# Patient Record
Sex: Female | Born: 1970 | Race: Black or African American | Hispanic: No | Marital: Married | Smoking: Current every day smoker
Health system: Southern US, Community
[De-identification: ages and names within clinical notes are randomized; demographics above are authoritative.]

## PROBLEM LIST (undated history)

## (undated) DIAGNOSIS — D6859 Other primary thrombophilia: Secondary | ICD-10-CM

## (undated) DIAGNOSIS — M549 Dorsalgia, unspecified: Secondary | ICD-10-CM

## (undated) DIAGNOSIS — R531 Weakness: Secondary | ICD-10-CM

## (undated) DIAGNOSIS — J189 Pneumonia, unspecified organism: Secondary | ICD-10-CM

## (undated) DIAGNOSIS — Z86711 Personal history of pulmonary embolism: Secondary | ICD-10-CM

## (undated) DIAGNOSIS — R3915 Urgency of urination: Secondary | ICD-10-CM

## (undated) DIAGNOSIS — G8929 Other chronic pain: Secondary | ICD-10-CM

## (undated) DIAGNOSIS — M1990 Unspecified osteoarthritis, unspecified site: Secondary | ICD-10-CM

## (undated) HISTORY — PX: WISDOM TOOTH EXTRACTION: SHX21

## (undated) HISTORY — PX: ABDOMINAL HYSTERECTOMY: SHX81

## (undated) HISTORY — PX: BACK SURGERY: SHX140

## (undated) HISTORY — PX: KNEE ARTHROSCOPY: SUR90

---

## 2002-12-16 HISTORY — PX: DIAGNOSTIC LAPAROSCOPY: SUR761

## 2008-03-16 DIAGNOSIS — Z86711 Personal history of pulmonary embolism: Secondary | ICD-10-CM

## 2008-03-16 HISTORY — DX: Personal history of pulmonary embolism: Z86.711

## 2012-10-19 ENCOUNTER — Encounter (HOSPITAL_COMMUNITY): Payer: Self-pay | Admitting: Emergency Medicine

## 2012-10-19 ENCOUNTER — Emergency Department (HOSPITAL_COMMUNITY)
Admission: EM | Admit: 2012-10-19 | Discharge: 2012-10-20 | Disposition: A | Discharge status: Home / Self Care | Payer: BC Managed Care – PPO | Attending: Emergency Medicine | Admitting: Emergency Medicine

## 2012-10-19 ENCOUNTER — Emergency Department (HOSPITAL_COMMUNITY): Payer: BC Managed Care – PPO

## 2012-10-19 DIAGNOSIS — Z862 Personal history of diseases of the blood and blood-forming organs and certain disorders involving the immune mechanism: Secondary | ICD-10-CM | POA: Insufficient documentation

## 2012-10-19 DIAGNOSIS — N938 Other specified abnormal uterine and vaginal bleeding: Secondary | ICD-10-CM | POA: Insufficient documentation

## 2012-10-19 DIAGNOSIS — R0789 Other chest pain: Secondary | ICD-10-CM

## 2012-10-19 DIAGNOSIS — R0609 Other forms of dyspnea: Secondary | ICD-10-CM | POA: Insufficient documentation

## 2012-10-19 DIAGNOSIS — Z8742 Personal history of other diseases of the female genital tract: Secondary | ICD-10-CM | POA: Insufficient documentation

## 2012-10-19 DIAGNOSIS — R0600 Dyspnea, unspecified: Secondary | ICD-10-CM

## 2012-10-19 DIAGNOSIS — R0989 Other specified symptoms and signs involving the circulatory and respiratory systems: Secondary | ICD-10-CM | POA: Insufficient documentation

## 2012-10-19 DIAGNOSIS — Z86718 Personal history of other venous thrombosis and embolism: Secondary | ICD-10-CM | POA: Insufficient documentation

## 2012-10-19 DIAGNOSIS — F17200 Nicotine dependence, unspecified, uncomplicated: Secondary | ICD-10-CM | POA: Insufficient documentation

## 2012-10-19 DIAGNOSIS — N949 Unspecified condition associated with female genital organs and menstrual cycle: Secondary | ICD-10-CM | POA: Insufficient documentation

## 2012-10-19 HISTORY — DX: Other primary thrombophilia: D68.59

## 2012-10-19 LAB — COMPREHENSIVE METABOLIC PANEL
ALT: 11 U/L (ref 0–35)
Alkaline Phosphatase: 78 U/L (ref 39–117)
CO2: 25 mEq/L (ref 19–32)
Chloride: 104 mEq/L (ref 96–112)
GFR calc Af Amer: >90 mL/min (ref >90)
GFR calc non Af Amer: >90 mL/min (ref >90)
Glucose, Bld: 91 mg/dL (ref 70–99)
Potassium: 4.7 mEq/L (ref 3.5–5.1)
Sodium: 138 mEq/L (ref 135–145)
Total Bilirubin: 0.3 mg/dL (ref 0.3–1.2)

## 2012-10-19 LAB — CBC WITH DIFFERENTIAL/PLATELET
Eosinophils Relative: 1 % (ref 0–5)
Lymphocytes Relative: 44 % (ref 12–46)
Lymphs Abs: 3.3 10*3/uL (ref 0.7–4.0)
MCV: 89 fL (ref 78.0–100.0)
Neutrophils Relative %: 43 % (ref 43–77)
Platelets: 330 10*3/uL (ref 150–400)
RBC: 4.64 MIL/uL (ref 3.87–5.11)
WBC: 7.4 10*3/uL (ref 4.0–10.5)

## 2012-10-19 MED ORDER — SODIUM CHLORIDE 0.9 % IV BOLUS (SEPSIS)
1000.0000 mL | Freq: Once | INTRAVENOUS | Status: AC
  Administered 2012-10-20: 1000 mL via INTRAVENOUS

## 2012-10-19 MED ORDER — MORPHINE SULFATE 4 MG/ML IJ SOLN
4.0000 mg | Freq: Once | INTRAMUSCULAR | Status: AC
  Administered 2012-10-20: 4 mg via INTRAVENOUS
  Filled 2012-10-19: qty 1

## 2012-10-19 MED ORDER — ONDANSETRON HCL 4 MG/2ML IJ SOLN
4.0000 mg | Freq: Once | INTRAMUSCULAR | Status: AC
  Administered 2012-10-20: 4 mg via INTRAVENOUS
  Filled 2012-10-19: qty 2

## 2012-10-19 NOTE — ED Notes (Signed)
PT. REPORTS INTERMITTENT VAGINAL BLEEDING WITH CLOTS FOR 3 WEEKS , ALSO REPORTS SOB AND LIGHTHEADED STATES HISTORY OF PROTEIN C DEFICIENCY / PULMONARY EMBOLISM  AND ENDOMETRIOSIS.

## 2012-10-20 ENCOUNTER — Emergency Department (HOSPITAL_COMMUNITY): Payer: BC Managed Care – PPO

## 2012-10-20 LAB — URINE MICROSCOPIC-ADD ON

## 2012-10-20 LAB — URINALYSIS, ROUTINE W REFLEX MICROSCOPIC
Specific Gravity, Urine: 1.005 (ref 1.005–1.030)
Urobilinogen, UA: 1 mg/dL (ref 0.0–1.0)

## 2012-10-20 MED ORDER — IOHEXOL 350 MG/ML SOLN
100.0000 mL | Freq: Once | INTRAVENOUS | Status: AC | PRN
  Administered 2012-10-20: 100 mL via INTRAVENOUS

## 2012-10-20 NOTE — ED Notes (Signed)
Encouraged to attempt urine sample.

## 2012-10-20 NOTE — ED Notes (Signed)
Patient returned from CT

## 2012-10-20 NOTE — ED Notes (Signed)
Pt alert, NAD, calm, interactive, resting on R side/sleeping, BP trending lower,  EDP aware, EDP into room, pt updated. No changes.

## 2012-10-20 NOTE — ED Notes (Signed)
Patient transported to CT 

## 2012-10-20 NOTE — ED Notes (Signed)
Low BP noted. Will continue to monitor. Asymptomatic. Family at [facility].

## 2012-10-20 NOTE — ED Notes (Signed)
EDP in to room. Pt sleeping/resting, arousable, NAD, calm, interactive. Family at [facility].

## 2012-10-20 NOTE — ED Provider Notes (Signed)
History     CSN: [id number]  Arrival date & time 10/19/12  2025   First MD Initiated Contact with Patient 10/19/12 2318      Chief Complaint  Patient presents with  . Vaginal Bleeding  . Shortness of Breath    (Consider location/radiation/quality/duration/timing/severity/associated sxs/prior treatment) HPI Pt with R sided chest pain starting yesterday with SOB. Pain worse with inspriation. States she thinks this is similar to prev PE. No fever chills, cough. No recent lower ext swelling. Pt is not currently on blood thinners.   Pt also states she has had intermittent vaginal bleeding. She has known fibroids. No new d/c or urinary symptoms Past Medical History  Diagnosis Date  . Pulmonary embolism   . Protein C deficiency   . Endometriosis   . Fibroids     History reviewed. No pertinent past surgical history.  No family history on file.  History  Substance Use Topics  . Smoking status: Current Every Day Smoker  . Smokeless tobacco: Not on file  . Alcohol Use: No    OB History    Grav Para Term Preterm Abortions TAB SAB Ect Mult Living                  Review of Systems  Constitutional: Negative for fever and chills.  HENT: Negative for neck pain.   Respiratory: Positive for shortness of breath. Negative for cough and wheezing.   Cardiovascular: Positive for chest pain. Negative for palpitations and leg swelling.  Gastrointestinal: Negative for nausea, vomiting, abdominal pain, diarrhea and constipation.  Genitourinary: Positive for vaginal bleeding. Negative for dysuria, hematuria, flank pain, vaginal discharge and vaginal pain.  Musculoskeletal: Negative for back pain.  Skin: Negative for rash and wound.  Neurological: Negative for dizziness, weakness, numbness and headaches.    Allergies  Review of patient's allergies indicates no known allergies.  Home Medications  No current outpatient prescriptions on file.  BP 106/69  Pulse 77  Temp 97.5 F (36.4  C) (Oral)  Resp 18  SpO2 95%  Physical Exam  Nursing note and vitals reviewed. Constitutional: She is oriented to person, place, and time. She appears well-developed and well-nourished. No distress.  HENT:  Head: Normocephalic and atraumatic.  Mouth/Throat: Oropharynx is clear and moist.  Eyes: EOM are normal. Pupils are equal, round, and reactive to light.  Neck: Normal range of motion. Neck supple.  Cardiovascular: Normal rate and regular rhythm.   Pulmonary/Chest: Effort normal and breath sounds normal. No respiratory distress. She has no wheezes. She has no rales. She exhibits tenderness (R chest wall TTP. No crepitance or deformity ).  Abdominal: Soft. Bowel sounds are normal. She exhibits no mass. There is no tenderness. There is no rebound and no guarding.  Musculoskeletal: Normal range of motion. She exhibits no edema and no tenderness.       No calf swelling or tenderness  Neurological: She is alert and oriented to person, place, and time.       5/5 motor in all ext. Sensation fully intact  Skin: Skin is warm and dry. No rash noted. No erythema.  Psychiatric: She has a normal mood and affect. Her behavior is normal.    ED Course  Procedures (including critical care time)  Labs Reviewed  URINALYSIS, ROUTINE W REFLEX MICROSCOPIC - Abnormal; Notable for the following:    Hgb urine dipstick MODERATE (*)     All other components within normal limits  URINE MICROSCOPIC-ADD ON - Abnormal; Notable for the following:  Squamous Epithelial / LPF FEW (*)     All other components within normal limits  CBC WITH DIFFERENTIAL  COMPREHENSIVE METABOLIC PANEL  PROTIME-INR  TROPONIN I  POCT PREGNANCY, URINE  TROPONIN I   Dg Chest 2 View  10/19/2012  *RADIOLOGY REPORT*  Clinical Data: 41 year old female with shortness of breath chest pain vaginal bleeding.  CHEST - 2 VIEW  Comparison: None.  Findings: Lung volumes are within normal limits. Normal cardiac size and mediastinal contours.   Visualized tracheal air column is within normal limits.    Small calcified granuloma in the right middle lobe.  No pneumothorax, pulmonary edema, pleural effusion or confluent pulmonary opacity. No acute osseous abnormality identified.  IMPRESSION: No acute cardiopulmonary abnormality.   Original Report Authenticated By: Erskine Speed, M.D.    Ct Angio Chest W/cm &/or Wo Cm  10/20/2012  *RADIOLOGY REPORT*  Clinical Data: Shortness of breath; vaginal bleeding.  CT ANGIOGRAPHY CHEST  Technique:  Multidetector CT imaging of the chest using the standard protocol during bolus administration of intravenous contrast. Multiplanar reconstructed images including MIPs were obtained and reviewed to evaluate the vascular anatomy.  Contrast: OMNIPAQUE IOHEXOL 350 MG/ML SOLN  Comparison: Chest radiograph performed 10/19/2012  Findings: There is no evidence of pulmonary embolus.  Scattered small thin-walled cysts are noted within the upper lung lobes and the right middle lobe.  This appearance can be seen with lymphangioleiomyomatosis or Langerhan's cell histiocytosis, though emphysema is considered more likely.  A single 3 mm nodule is noted at the right upper lobe (image 28 of 91).  The lungs otherwise appear grossly clear.  A calcified granuloma is noted at the right lung base.  There is no evidence of significant focal consolidation, pleural effusion or pneumothorax.  No masses are identified; no abnormal focal contrast enhancement is seen.  The mediastinum is unremarkable in appearance.  No mediastinal lymphadenopathy is seen.  No pericardial effusion is identified. The great vessels are unremarkable in appearance.  The visualized portions of the thyroid gland are unremarkable.  No axillary lymphadenopathy is seen.  The visualized portions of the liver and spleen are unremarkable. The visualized portions of the pancreas, gallbladder, stomach, adrenal glands and kidneys are within normal limits.  No acute osseous  abnormalities are seen.  IMPRESSION:  1.  No evidence of pulmonary embolus. 2.  Scattered small cysts within the upper lung lobes and right middle lobe.  Emphysema is considered most likely, though this appearance can be seen with lymphangioleiomyomatosis or Langerhan's cell histiocytosis.  Single 3 mm nodule noted at the right upper lobe.   Original Report Authenticated By: Tonia Ghent, M.D.      1. Dyspnea   2. Atypical chest pain   3. Dysfunctional uterine bleeding      Date: 10/20/2012  Rate: 82  Rhythm: normal sinus rhythm  QRS Axis: right  Intervals: normal  ST/T Wave abnormalities: normal  Conduction Disutrbances:incomplete RBBB  Narrative Interpretation:   Old EKG Reviewed: none available    MDM   Pt symptoms have resolved. Trop x 2 neg. CT chest with no PE. Stabel VS and hgb. Pt advised to f/u with gynecology for irregular bleeding. Return for concerns       Loren Racer, MD 10/20/12 [phone]

## 2012-10-20 NOTE — ED Notes (Signed)
consistant HR of 90 while walking down hallway. SPO2 100% RA. Steady gait. Reports some light headedness. Returning BP lying down after walk 90/58.

## 2012-11-04 ENCOUNTER — Telehealth: Payer: Self-pay | Admitting: Hematology and Oncology

## 2012-11-04 NOTE — Telephone Encounter (Signed)
S/W PT IN REF TO NP APPT. ON 11/25/12@1 :00 REFERRING DR. Seymour Bars DX-HX PROTEIN C DEFICIENCY MAILED NP PACKET

## 2012-11-24 ENCOUNTER — Telehealth: Payer: Self-pay | Admitting: Oncology

## 2012-11-24 NOTE — Telephone Encounter (Signed)
R/S NP APPT TO 12/23/12 @ 10:30 WITH DR HA. PT IS AWARE.

## 2012-11-25 ENCOUNTER — Other Ambulatory Visit: Payer: BC Managed Care – PPO | Admitting: Lab

## 2012-11-25 ENCOUNTER — Ambulatory Visit: Payer: BC Managed Care – PPO | Admitting: Hematology and Oncology

## 2012-11-25 ENCOUNTER — Ambulatory Visit: Payer: BC Managed Care – PPO

## 2012-12-21 ENCOUNTER — Encounter: Payer: Self-pay | Admitting: Oncology

## 2012-12-21 DIAGNOSIS — D219 Benign neoplasm of connective and other soft tissue, unspecified: Secondary | ICD-10-CM | POA: Insufficient documentation

## 2012-12-21 DIAGNOSIS — N809 Endometriosis, unspecified: Secondary | ICD-10-CM | POA: Insufficient documentation

## 2012-12-21 DIAGNOSIS — I2699 Other pulmonary embolism without acute cor pulmonale: Secondary | ICD-10-CM | POA: Insufficient documentation

## 2012-12-21 DIAGNOSIS — D6859 Other primary thrombophilia: Secondary | ICD-10-CM | POA: Insufficient documentation

## 2012-12-23 ENCOUNTER — Other Ambulatory Visit: Payer: BC Managed Care – PPO | Admitting: Lab

## 2012-12-23 ENCOUNTER — Ambulatory Visit: Payer: BC Managed Care – PPO | Admitting: Oncology

## 2012-12-23 ENCOUNTER — Ambulatory Visit: Payer: BC Managed Care – PPO

## 2012-12-23 NOTE — Progress Notes (Signed)
No show.  Reschedule prn.  

## 2013-01-12 ENCOUNTER — Other Ambulatory Visit: Payer: Self-pay | Admitting: Oncology

## 2013-01-26 ENCOUNTER — Encounter (HOSPITAL_COMMUNITY): Payer: Self-pay

## 2013-01-27 ENCOUNTER — Other Ambulatory Visit: Payer: Self-pay | Admitting: Obstetrics & Gynecology

## 2013-01-27 ENCOUNTER — Encounter (HOSPITAL_COMMUNITY): Payer: Self-pay | Admitting: *Deleted

## 2013-01-28 ENCOUNTER — Encounter (HOSPITAL_COMMUNITY): Payer: Self-pay | Admitting: Anesthesiology

## 2013-01-28 ENCOUNTER — Ambulatory Visit (HOSPITAL_COMMUNITY)
Admission: RE | Admit: 2013-01-28 | Discharge: 2013-01-28 | Disposition: A | Discharge status: Home / Self Care | Payer: BC Managed Care – PPO | Source: Ambulatory Visit | Attending: Obstetrics & Gynecology | Admitting: Obstetrics & Gynecology

## 2013-01-28 ENCOUNTER — Encounter (HOSPITAL_COMMUNITY)
Admission: RE | Disposition: A | Discharge status: Home / Self Care | Payer: Self-pay | Source: Ambulatory Visit | Attending: Obstetrics & Gynecology

## 2013-01-28 ENCOUNTER — Ambulatory Visit (HOSPITAL_COMMUNITY): Payer: BC Managed Care – PPO | Admitting: Anesthesiology

## 2013-01-28 DIAGNOSIS — O039 Complete or unspecified spontaneous abortion without complication: Secondary | ICD-10-CM | POA: Insufficient documentation

## 2013-01-28 DIAGNOSIS — D6859 Other primary thrombophilia: Secondary | ICD-10-CM | POA: Insufficient documentation

## 2013-01-28 HISTORY — PX: DILATION AND EVACUATION: SHX1459

## 2013-01-28 LAB — CBC
HCT: 36.7 % (ref 36.0–46.0)
MCV: 89.3 fL (ref 78.0–100.0)
Platelets: 323 10*3/uL (ref 150–400)
RBC: 4.11 MIL/uL (ref 3.87–5.11)
RDW: 12.4 % (ref 11.5–15.5)
WBC: 5.9 10*3/uL (ref 4.0–10.5)

## 2013-01-28 LAB — ABO/RH: ABO/RH(D): O POS

## 2013-01-28 SURGERY — DILATION AND EVACUATION, UTERUS
Anesthesia: Monitor Anesthesia Care | Site: Uterus | Wound class: Clean Contaminated

## 2013-01-28 MED ORDER — DEXAMETHASONE SODIUM PHOSPHATE 10 MG/ML IJ SOLN
INTRAMUSCULAR | Status: AC
  Filled 2013-01-28: qty 1

## 2013-01-28 MED ORDER — LIDOCAINE HCL (CARDIAC) 20 MG/ML IV SOLN
INTRAVENOUS | Status: AC
  Filled 2013-01-28: qty 5

## 2013-01-28 MED ORDER — FENTANYL CITRATE 0.05 MG/ML IJ SOLN
25.0000 ug | INTRAMUSCULAR | Status: DC | PRN
  Administered 2013-01-28 (×2): 50 ug via INTRAVENOUS

## 2013-01-28 MED ORDER — KETOROLAC TROMETHAMINE 30 MG/ML IJ SOLN: 15.0000 mg | Freq: Once | INTRAMUSCULAR | Status: DC | PRN

## 2013-01-28 MED ORDER — CEFAZOLIN SODIUM-DEXTROSE 2-3 GM-% IV SOLR
2.0000 g | INTRAVENOUS | Status: AC
  Administered 2013-01-28: 2 g via INTRAVENOUS

## 2013-01-28 MED ORDER — SILVER NITRATE-POT NITRATE 75-25 % EX MISC
CUTANEOUS | Status: AC
  Filled 2013-01-28: qty 2

## 2013-01-28 MED ORDER — KETOROLAC TROMETHAMINE 30 MG/ML IJ SOLN
INTRAMUSCULAR | Status: DC | PRN
  Administered 2013-01-28: 30 mg via INTRAVENOUS

## 2013-01-28 MED ORDER — MIDAZOLAM HCL 2 MG/2ML IJ SOLN
INTRAMUSCULAR | Status: AC
  Filled 2013-01-28: qty 2

## 2013-01-28 MED ORDER — SILVER NITRATE-POT NITRATE 75-25 % EX MISC
CUTANEOUS | Status: DC | PRN
  Administered 2013-01-28: 2

## 2013-01-28 MED ORDER — LACTATED RINGERS IV SOLN
INTRAVENOUS | Status: DC
  Administered 2013-01-28 (×2): via INTRAVENOUS

## 2013-01-28 MED ORDER — CEFAZOLIN SODIUM-DEXTROSE 2-3 GM-% IV SOLR
INTRAVENOUS | Status: AC
  Filled 2013-01-28: qty 50

## 2013-01-28 MED ORDER — ONDANSETRON HCL 4 MG/2ML IJ SOLN
INTRAMUSCULAR | Status: DC | PRN
  Administered 2013-01-28: 4 mg via INTRAVENOUS

## 2013-01-28 MED ORDER — FENTANYL CITRATE 0.05 MG/ML IJ SOLN
INTRAMUSCULAR | Status: AC
  Filled 2013-01-28: qty 2

## 2013-01-28 MED ORDER — DEXAMETHASONE SODIUM PHOSPHATE 10 MG/ML IJ SOLN
INTRAMUSCULAR | Status: DC | PRN
  Administered 2013-01-28: 10 mg via INTRAVENOUS

## 2013-01-28 MED ORDER — PROPOFOL 10 MG/ML IV EMUL
INTRAVENOUS | Status: AC
  Filled 2013-01-28: qty 40

## 2013-01-28 MED ORDER — MIDAZOLAM HCL 5 MG/5ML IJ SOLN
INTRAMUSCULAR | Status: DC | PRN
  Administered 2013-01-28: 2 mg via INTRAVENOUS

## 2013-01-28 MED ORDER — PROPOFOL 10 MG/ML IV EMUL
INTRAVENOUS | Status: DC | PRN
  Administered 2013-01-28 (×5): 50 mg via INTRAVENOUS

## 2013-01-28 MED ORDER — FENTANYL CITRATE 0.05 MG/ML IJ SOLN
INTRAMUSCULAR | Status: DC | PRN
  Administered 2013-01-28 (×2): 100 ug via INTRAVENOUS

## 2013-01-28 MED ORDER — ONDANSETRON HCL 4 MG/2ML IJ SOLN
INTRAMUSCULAR | Status: AC
  Filled 2013-01-28: qty 2

## 2013-01-28 MED ORDER — LIDOCAINE HCL 1 % IJ SOLN
INTRAMUSCULAR | Status: DC | PRN
  Administered 2013-01-28: 20 mL

## 2013-01-28 MED ORDER — LIDOCAINE HCL (CARDIAC) 20 MG/ML IV SOLN
INTRAVENOUS | Status: DC | PRN
  Administered 2013-01-28: 50 mg via INTRAVENOUS

## 2013-01-28 MED ORDER — FENTANYL CITRATE 0.05 MG/ML IJ SOLN
INTRAMUSCULAR | Status: AC
  Administered 2013-01-28: 50 ug via INTRAVENOUS
  Filled 2013-01-28: qty 2

## 2013-01-28 SURGICAL SUPPLY — 23 items
CATH ROBINSON RED A/P 16FR (CATHETERS) ×2 IMPLANT
CLOTH BEACON ORANGE TIMEOUT ST (SAFETY) ×2 IMPLANT
DECANTER SPIKE VIAL GLASS SM (MISCELLANEOUS) ×2 IMPLANT
GLOVE BIO SURGEON STRL SZ 6.5 (GLOVE) ×4 IMPLANT
GLOVE BIOGEL PI IND STRL 7.0 (GLOVE) ×1 IMPLANT
GLOVE BIOGEL PI INDICATOR 7.0 (GLOVE) ×1
GLOVE ECLIPSE 6.0 STRL STRAW (GLOVE) ×1 IMPLANT
GLOVE INDICATOR 7.0 STRL GRN (GLOVE) ×1 IMPLANT
GOWN STRL REIN XL XLG (GOWN DISPOSABLE) ×4 IMPLANT
KIT BERKELEY 1ST TRIMESTER 3/8 (MISCELLANEOUS) ×2 IMPLANT
NDL SPNL 22GX3.5 QUINCKE BK (NEEDLE) ×1 IMPLANT
NEEDLE SPNL 22GX3.5 QUINCKE BK (NEEDLE) ×2 IMPLANT
PACK VAGINAL MINOR WOMEN LF (CUSTOM PROCEDURE TRAY) ×2 IMPLANT
PAD OB MATERNITY 4.3X12.25 (PERSONAL CARE ITEMS) ×2 IMPLANT
PAD PREP 24X48 CUFFED NSTRL (MISCELLANEOUS) ×2 IMPLANT
SET BERKELEY SUCTION TUBING (SUCTIONS) ×2 IMPLANT
SYR CONTROL 10ML LL (SYRINGE) ×2 IMPLANT
TOWEL OR 17X24 6PK STRL BLUE (TOWEL DISPOSABLE) ×4 IMPLANT
VACURETTE 10 RIGID CVD (CANNULA) ×1 IMPLANT
VACURETTE 7MM CVD STRL WRAP (CANNULA) IMPLANT
VACURETTE 8 RIGID CVD (CANNULA) IMPLANT
VACURETTE 9 RIGID CVD (CANNULA) IMPLANT
WATER STERILE IRR 1000ML POUR (IV SOLUTION) ×2 IMPLANT

## 2013-01-28 NOTE — Op Note (Signed)
01/28/2013  1:57 PM  PATIENT:  [name] [name]  42 y.o. female  PRE-OPERATIVE DIAGNOSIS:  Elective 8 wks, High Risk-Protein C Deficiency, H/O pulmonary embolism, AMA  POST-OPERATIVE DIAGNOSIS:  Elective, High Risk-Protein C Deficiency, H/O pulmonary embolism, AM andA   PROCEDURE:  Procedure(s): DILATATION AND EVACUATION  SURGEON:  Surgeon(s): Genia Del, MD  ASSISTANTS: none   ANESTHESIA:   MAC/paracervical block  Procedure:  Under MAC analgesia the patient is in lithotomy position. She is prepped with Betadine on the suprapubic, vulvar and vaginal areas. She is draped as usual. The vaginal exam reveals an anteverted uterus about 8 cm mobile no adnexal mass. A speculum is inserted in the vagina and the anterior lip of the cervix is grasped with a tenaculum. Dilation of the cervix with Hegar dilators up to #31 without difficulty. A #10 curved curet is used to suction the products of conception. We then used a sharp curet to complete the curettage on all uterine surfaces. We go back with the suction curette to assure that the cavity it completely emptied. We then removed the instruments.  We completed hemostasis on the anterior lip of the cervix with silver nitrate.   We removed the speculum.  Hemostasis was adequate.  The patient was brought to recovery room in good and stable status.  ESTIMATED BLOOD LOSS: 25 cc   Intake/Output Summary (Last 24 hours) at 01/28/13 1357 Last data filed at 01/28/13 1345  Gross per 24 hour  Intake      0 ml  Output    125 ml  Net   -125 ml     BLOOD ADMINISTERED:none   LOCAL MEDICATIONS USED:  LIDOCAINE   SPECIMEN:  Source of Specimen:  Products of conception  DISPOSITION OF SPECIMEN:  PATHOLOGY  COUNTS:  YES  PLAN OF CARE: Transfer to PACU   Genia Del MD  01/28/2013 at and a1:59 pm.

## 2013-01-28 NOTE — Anesthesia Postprocedure Evaluation (Signed)
  Anesthesia Post-op Note  Anesthesia Post Note  Patient: [name] [name]  Procedure(s) Performed: Procedure(s) (LRB): DILATATION AND EVACUATION (N/A)  Anesthesia type: MAC  Patient location: PACU  Post pain: Pain level controlled  Post assessment: Post-op Vital signs reviewed  Last Vitals:  Filed Vitals:   01/28/13 1530  BP: 110/78  Pulse: 68  Temp:   Resp: 16    Post vital signs: Reviewed  Level of consciousness: sedated  Complications: No apparent anesthesia complications

## 2013-01-28 NOTE — Transfer of Care (Signed)
Immediate Anesthesia Transfer of Care Note  Patient: [name] [name]  Procedure(s) Performed: Procedure(s): DILATATION AND EVACUATION (N/A)  Patient Location: PACU  Anesthesia Type:MAC  Level of Consciousness: awake, alert , oriented and patient cooperative  Airway & Oxygen Therapy: Patient Spontanous Breathing and Patient connected to nasal cannula oxygen  Post-op Assessment: Report given to PACU RN, Post -op Vital signs reviewed and stable and Patient moving all extremities X 4  Post vital signs: Reviewed and stable  Complications: No apparent anesthesia complications

## 2013-01-28 NOTE — Discharge Summary (Signed)
  Physician Discharge Summary  Patient ID: [name] [name] MRN: [id number] DOB/AGE: November 27, 1971 42 y.o.  Admit date: 01/28/2013 Discharge date: 01/28/2013  Admission Diagnoses: Elective, High Risk-Protein C Deficiency  46962  Discharge Diagnoses: Elective, High Risk-Protein C Deficiency  95284        Active Problems:   * No active hospital problems. *   Discharged Condition: good  Hospital Course:  Outpatient  Consults: None  Treatments: surgery: D+E  Disposition: 01-Home or Self Care   Future Appointments Provider Department Dept Phone   02/03/2013 2:15 PM [facility] [phone]       Medication List    TAKE these medications       HYDROcodone-acetaminophen 5-325 MG per tablet  Commonly known as:  NORCO/VICODIN  Take 0.5 tablets by mouth daily as needed for pain (has only taken 1.5 tablets this month).           Follow-up Information   Follow up with Devyn Griffing,MARIE-LYNE, MD In 1 week.   Contact information:   [address] [place] [address] [phone]       Signed: Genia Del, MD 01/28/2013, 2:10 PM

## 2013-01-28 NOTE — Anesthesia Preprocedure Evaluation (Addendum)
Anesthesia Evaluation  Patient identified by MRN, date of birth, ID band Patient awake    Reviewed: Allergy & Precautions, H&P , NPO status , Patient's Chart, lab work & pertinent test results, reviewed documented beta blocker date and time   History of Anesthesia Complications Negative for: history of anesthetic complications  Airway Mallampati: III TM Distance: >3 FB Neck ROM: full    Dental  (+) Teeth Intact   Pulmonary Current Smoker (1/2 ppd), PE (bilateral 2009, last lovenox 3/13) breath sounds clear to auscultation  Pulmonary exam normal       Cardiovascular Exercise Tolerance: Good negative cardio ROS      Neuro/Psych negative neurological ROS  negative psych ROS   GI/Hepatic negative GI ROS, Neg liver ROS,   Endo/Other  negative endocrine ROS  Renal/GU negative Renal ROS  Female GU complaint (fibroids, endometriosis)     Musculoskeletal   Abdominal Normal abdominal exam  (+)   Peds  Hematology Protein C deficiency, hypercoagulable   Anesthesia Other Findings   Reproductive/Obstetrics (+) Pregnancy (elective TAB)                          Anesthesia Physical Anesthesia Plan  ASA: II  Anesthesia Plan: MAC   Post-op Pain Management:    Induction:   Airway Management Planned:   Additional Equipment:   Intra-op Plan:   Post-operative Plan:   Informed Consent: I have reviewed the patients History and Physical, chart, labs and discussed the procedure including the risks, benefits and alternatives for the proposed anesthesia with the patient or authorized representative who has indicated his/her understanding and acceptance.     Plan Discussed with: Surgeon and CRNA  Anesthesia Plan Comments:         Anesthesia Quick Evaluation

## 2013-01-28 NOTE — H&P (Signed)
[name] [name] is an 42 y.o. female   Pertinent Gynecological History: Menses: flow is excessive with use of many pads or tampons on heaviest days Contraception: none Blood transfusions: none Sexually transmitted diseases: no past history Previous GYN Procedures: Dx LPS x 3  Last mammogram: normal  Last pap: normal    Menstrual History:  No LMP recorded. Patient is not currently having periods (Reason: Other).    Past Medical History  Diagnosis Date  . Bilateral pulmonary embolism 2009    3/13-last used lovenox  . Protein C deficiency   . Endometriosis   . Fibroids   . At high risk for complications of intrauterine pregnancy (IUP)     8 week    Past Surgical History  Procedure Laterality Date  . Diagnostic laparoscopy  2004    x3  . Knee arthroscopy      History reviewed. No pertinent family history.  Social History:  reports that she has been smoking Cigarettes.  She has a 1 pack-year smoking history. She does not have any smokeless tobacco history on file. She reports that she does not drink alcohol or use illicit drugs.  Allergies: No Known Allergies  Prescriptions prior to admission  Medication Sig Dispense Refill  . HYDROcodone-acetaminophen (NORCO/VICODIN) 5-325 MG per tablet Take 0.5 tablets by mouth daily as needed for pain (has only taken 1.5 tablets this month).         Blood pressure 108/69, pulse 87, temperature 98.1 F (36.7 C), temperature source Oral, resp. rate 18, height 5\' 7"  (1.702 m), weight 75.297 kg (166 lb), SpO2 100.00%.  [place] 8 wks sIUP.  Results for orders placed during the hospital encounter of 01/28/13 (from the past 24 hour(s))  CBC     Status: None   Collection Time    01/28/13 12:32 PM      Result Value Range   WBC 5.9  4.0 - 10.5 K/uL   RBC 4.11  3.87 - 5.11 MIL/uL   Hemoglobin 12.7  12.0 - 15.0 g/dL   HCT 16.1  09.6 - 04.5 %   MCV 89.3  78.0 - 100.0 fL   MCH 30.9  26.0 - 34.0 pg   MCHC 34.6  30.0 - 36.0 g/dL   RDW 40.9   81.1 - 91.4 %   Platelets 323  150 - 400 K/uL    No results found.  Assessment/Plan: High risk pregnancy with AMA and h/o PE for D+E.  Surgery and risks reviewed.  Chinmayi Rumer,MARIE-LYNE 01/28/2013, 1:16 PM

## 2013-01-30 ENCOUNTER — Encounter (HOSPITAL_COMMUNITY): Payer: Self-pay | Admitting: Obstetrics & Gynecology

## 2013-02-01 ENCOUNTER — Other Ambulatory Visit: Payer: Self-pay | Admitting: Obstetrics & Gynecology

## 2013-02-03 ENCOUNTER — Encounter (HOSPITAL_COMMUNITY): Payer: Self-pay

## 2013-02-03 ENCOUNTER — Encounter (HOSPITAL_COMMUNITY)
Admission: RE | Admit: 2013-02-03 | Discharge: 2013-02-03 | Disposition: A | Discharge status: Home / Self Care | Payer: BC Managed Care – PPO | Source: Ambulatory Visit | Attending: Obstetrics & Gynecology | Admitting: Obstetrics & Gynecology

## 2013-02-03 LAB — CBC
HCT: 39 % (ref 36.0–46.0)
MCHC: 34.4 g/dL (ref 30.0–36.0)
MCV: 90.1 fL (ref 78.0–100.0)
Platelets: 322 10*3/uL (ref 150–400)
RDW: 12.4 % (ref 11.5–15.5)
WBC: 7.6 10*3/uL (ref 4.0–10.5)

## 2013-02-03 LAB — SURGICAL PCR SCREEN: Staphylococcus aureus: NEGATIVE

## 2013-02-03 NOTE — Patient Instructions (Addendum)
   Your procedure is scheduled on: Tuesday, Feb 25  Enter through the [employer] [facility] at: 1130 am Pick up the phone at the desk and dial [phone] and inform us of your arrival.  Please call this number if you have any problems the morning of surgery: [phone]  Remember: Do not eat food after midnight: Monday Do not drink clear liquids after: 9 am Tuesday Take these medicines the morning of surgery with a SIP OF WATER:  none  Do not wear jewelry, make-up, or FINGER nail polish No metal in your hair or on your body. Do not wear lotions, powders, perfumes. You may wear deodorant.  Please use your CHG wash as directed prior to surgery.  Do not shave anywhere for at least 12 hours prior to first CHG shower.  Do not bring valuables to the hospital. Contacts, dentures or bridgework may not be worn into surgery.  Leave suitcase in the car. After Surgery it may be brought to your room. For patients being admitted to the hospital, checkout time is 11:00am the day of discharge.  Patients discharged on the day of surgery will not be allowed to drive home.  Home with husband [name] [name].

## 2013-02-08 MED ORDER — DEXTROSE 5 % IV SOLN
2.0000 g | Freq: Once | INTRAVENOUS | Status: AC
  Administered 2013-02-09: 2 g via INTRAVENOUS
  Filled 2013-02-08: qty 2

## 2013-02-09 ENCOUNTER — Ambulatory Visit (HOSPITAL_COMMUNITY)
Admission: RE | Admit: 2013-02-09 | Discharge: 2013-02-10 | Disposition: A | Discharge status: Home / Self Care | Payer: BC Managed Care – PPO | Source: Ambulatory Visit | Attending: Obstetrics & Gynecology | Admitting: Obstetrics & Gynecology

## 2013-02-09 ENCOUNTER — Encounter (HOSPITAL_COMMUNITY)
Admission: RE | Disposition: A | Discharge status: Home / Self Care | Payer: Self-pay | Source: Ambulatory Visit | Attending: Obstetrics & Gynecology

## 2013-02-09 ENCOUNTER — Ambulatory Visit (HOSPITAL_COMMUNITY): Payer: BC Managed Care – PPO | Admitting: Anesthesiology

## 2013-02-09 ENCOUNTER — Encounter (HOSPITAL_COMMUNITY): Payer: Self-pay | Admitting: *Deleted

## 2013-02-09 ENCOUNTER — Encounter (HOSPITAL_COMMUNITY): Payer: Self-pay | Admitting: Anesthesiology

## 2013-02-09 DIAGNOSIS — D6859 Other primary thrombophilia: Secondary | ICD-10-CM | POA: Insufficient documentation

## 2013-02-09 DIAGNOSIS — N920 Excessive and frequent menstruation with regular cycle: Secondary | ICD-10-CM | POA: Insufficient documentation

## 2013-02-09 DIAGNOSIS — Z86711 Personal history of pulmonary embolism: Secondary | ICD-10-CM | POA: Insufficient documentation

## 2013-02-09 DIAGNOSIS — N949 Unspecified condition associated with female genital organs and menstrual cycle: Secondary | ICD-10-CM | POA: Insufficient documentation

## 2013-02-09 DIAGNOSIS — Z90710 Acquired absence of both cervix and uterus: Secondary | ICD-10-CM | POA: Diagnosis not present

## 2013-02-09 HISTORY — PX: UNILATERAL SALPINGECTOMY: SHX6160

## 2013-02-09 HISTORY — PX: ROBOTIC ASSISTED TOTAL HYSTERECTOMY: SHX6085

## 2013-02-09 LAB — TYPE AND SCREEN
ABO/RH(D): O POS
Antibody Screen: NEGATIVE

## 2013-02-09 LAB — CBC
HCT: 37.5 % (ref 36.0–46.0)
Hemoglobin: 12.9 g/dL (ref 12.0–15.0)
MCHC: 34.4 g/dL (ref 30.0–36.0)
MCV: 90.1 fL (ref 78.0–100.0)
RDW: 12.6 % (ref 11.5–15.5)

## 2013-02-09 LAB — CREATININE, SERUM: GFR calc non Af Amer: >90 mL/min (ref >90)

## 2013-02-09 LAB — PREGNANCY, URINE: Preg Test, Ur: POSITIVE — AB

## 2013-02-09 SURGERY — ROBOTIC ASSISTED TOTAL HYSTERECTOMY
Anesthesia: General | Site: Abdomen | Laterality: Right | Wound class: Clean Contaminated

## 2013-02-09 MED ORDER — IBUPROFEN 600 MG PO TABS
600.0000 mg | ORAL_TABLET | Freq: Four times a day (QID) | ORAL | Status: DC | PRN
  Administered 2013-02-10: 600 mg via ORAL
  Filled 2013-02-09: qty 1

## 2013-02-09 MED ORDER — ROCURONIUM BROMIDE 50 MG/5ML IV SOLN
INTRAVENOUS | Status: AC
  Filled 2013-02-09: qty 1

## 2013-02-09 MED ORDER — OXYCODONE-ACETAMINOPHEN 7.5-325 MG PO TABS: 1.0000 | ORAL_TABLET | ORAL | Status: DC | PRN

## 2013-02-09 MED ORDER — FENTANYL CITRATE 0.05 MG/ML IJ SOLN
25.0000 ug | INTRAMUSCULAR | Status: DC | PRN
  Administered 2013-02-09 (×2): 25 ug via INTRAVENOUS

## 2013-02-09 MED ORDER — HEPARIN SODIUM (PORCINE) 5000 UNIT/ML IJ SOLN: 5000.0000 [IU] | INTRAMUSCULAR | Status: AC

## 2013-02-09 MED ORDER — ROCURONIUM BROMIDE 100 MG/10ML IV SOLN
INTRAVENOUS | Status: DC | PRN
  Administered 2013-02-09: 50 mg via INTRAVENOUS
  Administered 2013-02-09: 10 mg via INTRAVENOUS

## 2013-02-09 MED ORDER — HYDROMORPHONE HCL PF 1 MG/ML IJ SOLN
1.0000 mg | INTRAMUSCULAR | Status: DC | PRN
  Administered 2013-02-09 – 2013-02-10 (×3): 1 mg via INTRAVENOUS
  Filled 2013-02-09 (×3): qty 1

## 2013-02-09 MED ORDER — BUPIVACAINE HCL (PF) 0.25 % IJ SOLN
INTRAMUSCULAR | Status: DC | PRN
  Administered 2013-02-09: 10 mL

## 2013-02-09 MED ORDER — ONDANSETRON HCL 4 MG/2ML IJ SOLN
INTRAMUSCULAR | Status: AC
  Filled 2013-02-09: qty 2

## 2013-02-09 MED ORDER — LACTATED RINGERS IV SOLN
INTRAVENOUS | Status: DC
Start: 2013-02-09 — End: 2013-02-09
  Administered 2013-02-09 (×2): via INTRAVENOUS

## 2013-02-09 MED ORDER — FENTANYL CITRATE 0.05 MG/ML IJ SOLN
INTRAMUSCULAR | Status: AC
  Filled 2013-02-09: qty 5

## 2013-02-09 MED ORDER — MIDAZOLAM HCL 2 MG/2ML IJ SOLN: 0.5000 mg | Freq: Once | INTRAMUSCULAR | Status: DC | PRN

## 2013-02-09 MED ORDER — LACTATED RINGERS IR SOLN
Status: DC | PRN
  Administered 2013-02-09: 1

## 2013-02-09 MED ORDER — PHENYLEPHRINE 40 MCG/ML (10ML) SYRINGE FOR IV PUSH (FOR BLOOD PRESSURE SUPPORT)
PREFILLED_SYRINGE | INTRAVENOUS | Status: AC
  Filled 2013-02-09: qty 5

## 2013-02-09 MED ORDER — FENTANYL CITRATE 0.05 MG/ML IJ SOLN
INTRAMUSCULAR | Status: AC
  Administered 2013-02-09: 50 ug via INTRAVENOUS
  Filled 2013-02-09: qty 2

## 2013-02-09 MED ORDER — OXYCODONE-ACETAMINOPHEN 5-325 MG PO TABS
1.0000 | ORAL_TABLET | ORAL | Status: DC | PRN
Start: 2013-02-09 — End: 2013-02-10
  Administered 2013-02-09 – 2013-02-10 (×2): 2 via ORAL
  Filled 2013-02-09 (×2): qty 2

## 2013-02-09 MED ORDER — FENTANYL CITRATE 0.05 MG/ML IJ SOLN
INTRAMUSCULAR | Status: DC | PRN
  Administered 2013-02-09 (×3): 50 ug via INTRAVENOUS
  Administered 2013-02-09: 100 ug via INTRAVENOUS

## 2013-02-09 MED ORDER — VASOPRESSIN 20 UNIT/ML IJ SOLN
INTRAMUSCULAR | Status: AC
  Filled 2013-02-09: qty 1

## 2013-02-09 MED ORDER — GLYCOPYRROLATE 0.2 MG/ML IJ SOLN
INTRAMUSCULAR | Status: DC | PRN
  Administered 2013-02-09: 0.4 mg via INTRAVENOUS

## 2013-02-09 MED ORDER — DEXAMETHASONE SODIUM PHOSPHATE 4 MG/ML IJ SOLN
INTRAMUSCULAR | Status: DC | PRN
  Administered 2013-02-09: 10 mg via INTRAVENOUS

## 2013-02-09 MED ORDER — MEPERIDINE HCL 25 MG/ML IJ SOLN: 6.2500 mg | INTRAMUSCULAR | Status: DC | PRN

## 2013-02-09 MED ORDER — MIDAZOLAM HCL 5 MG/5ML IJ SOLN
INTRAMUSCULAR | Status: DC | PRN
  Administered 2013-02-09: 2 mg via INTRAVENOUS

## 2013-02-09 MED ORDER — NEOSTIGMINE METHYLSULFATE 1 MG/ML IJ SOLN
INTRAMUSCULAR | Status: DC | PRN
  Administered 2013-02-09: 3 mg via INTRAVENOUS

## 2013-02-09 MED ORDER — LIDOCAINE HCL (CARDIAC) 20 MG/ML IV SOLN
INTRAVENOUS | Status: AC
  Filled 2013-02-09: qty 5

## 2013-02-09 MED ORDER — LACTATED RINGERS IV SOLN
INTRAVENOUS | Status: DC
  Administered 2013-02-09 – 2013-02-10 (×2): via INTRAVENOUS

## 2013-02-09 MED ORDER — PHENYLEPHRINE HCL 10 MG/ML IJ SOLN
INTRAMUSCULAR | Status: DC | PRN
  Administered 2013-02-09 (×3): 40 ug via INTRAVENOUS

## 2013-02-09 MED ORDER — HEPARIN SODIUM (PORCINE) 5000 UNIT/ML IJ SOLN
5000.0000 [IU] | Freq: Three times a day (TID) | INTRAMUSCULAR | Status: DC
  Administered 2013-02-09 – 2013-02-10 (×2): 5000 [IU] via SUBCUTANEOUS
  Filled 2013-02-09 (×6): qty 1

## 2013-02-09 MED ORDER — HYDROMORPHONE HCL PF 1 MG/ML IJ SOLN
INTRAMUSCULAR | Status: AC
  Filled 2013-02-09: qty 1

## 2013-02-09 MED ORDER — BUPIVACAINE HCL (PF) 0.25 % IJ SOLN
INTRAMUSCULAR | Status: AC
  Filled 2013-02-09: qty 30

## 2013-02-09 MED ORDER — MIDAZOLAM HCL 2 MG/2ML IJ SOLN
INTRAMUSCULAR | Status: AC
  Filled 2013-02-09: qty 2

## 2013-02-09 MED ORDER — PROMETHAZINE HCL 25 MG/ML IJ SOLN: 6.2500 mg | INTRAMUSCULAR | Status: DC | PRN

## 2013-02-09 MED ORDER — HEPARIN SODIUM (PORCINE) 5000 UNIT/ML IJ SOLN
INTRAMUSCULAR | Status: AC
  Administered 2013-02-09: 5000 [IU] via SUBCUTANEOUS
  Filled 2013-02-09: qty 1

## 2013-02-09 MED ORDER — LIDOCAINE HCL (CARDIAC) 20 MG/ML IV SOLN
INTRAVENOUS | Status: DC | PRN
  Administered 2013-02-09: 50 mg via INTRAVENOUS

## 2013-02-09 MED ORDER — ONDANSETRON HCL 4 MG/2ML IJ SOLN
INTRAMUSCULAR | Status: DC | PRN
  Administered 2013-02-09: 4 mg via INTRAVENOUS

## 2013-02-09 MED ORDER — DEXAMETHASONE SODIUM PHOSPHATE 10 MG/ML IJ SOLN
INTRAMUSCULAR | Status: AC
  Filled 2013-02-09: qty 1

## 2013-02-09 MED ORDER — KETOROLAC TROMETHAMINE 30 MG/ML IJ SOLN
INTRAMUSCULAR | Status: DC | PRN
  Administered 2013-02-09: 30 mg via INTRAVENOUS

## 2013-02-09 MED ORDER — KETOROLAC TROMETHAMINE 30 MG/ML IJ SOLN: 15.0000 mg | Freq: Once | INTRAMUSCULAR | Status: DC | PRN

## 2013-02-09 MED ORDER — HYDROMORPHONE HCL PF 1 MG/ML IJ SOLN
INTRAMUSCULAR | Status: DC | PRN
  Administered 2013-02-09 (×2): 0.5 mg via INTRAVENOUS

## 2013-02-09 MED ORDER — PROPOFOL 10 MG/ML IV EMUL
INTRAVENOUS | Status: DC | PRN
  Administered 2013-02-09: 50 mg via INTRAVENOUS
  Administered 2013-02-09: 150 mg via INTRAVENOUS

## 2013-02-09 MED ORDER — PROPOFOL 10 MG/ML IV EMUL
INTRAVENOUS | Status: AC
  Filled 2013-02-09: qty 20

## 2013-02-09 SURGICAL SUPPLY — 52 items
ADH SKN CLS APL DERMABOND .7 (GAUZE/BANDAGES/DRESSINGS) ×2
BAG URINE DRAINAGE (UROLOGICAL SUPPLIES) ×3 IMPLANT
BARRIER ADHS 3X4 INTERCEED (GAUZE/BANDAGES/DRESSINGS) ×3 IMPLANT
BRR ADH 4X3 ABS CNTRL BYND (GAUZE/BANDAGES/DRESSINGS) ×2
CATH FOLEY 3WAY  5CC 16FR (CATHETERS) ×1
CATH FOLEY 3WAY 5CC 16FR (CATHETERS) ×2 IMPLANT
CLOTH BEACON ORANGE TIMEOUT ST (SAFETY) ×3 IMPLANT
CONT PATH 16OZ SNAP LID 3702 (MISCELLANEOUS) ×3 IMPLANT
COVER MAYO STAND STRL (DRAPES) ×3 IMPLANT
COVER TABLE BACK 60X90 (DRAPES) ×6 IMPLANT
COVER TIP SHEARS 8 DVNC (MISCELLANEOUS) ×2 IMPLANT
COVER TIP SHEARS 8MM DA VINCI (MISCELLANEOUS) ×1
DERMABOND ADVANCED (GAUZE/BANDAGES/DRESSINGS) ×1
DERMABOND ADVANCED .7 DNX12 (GAUZE/BANDAGES/DRESSINGS) ×4 IMPLANT
DRAPE HUG U DISPOSABLE (DRAPE) ×3 IMPLANT
DRAPE LG THREE QUARTER DISP (DRAPES) ×6 IMPLANT
DRAPE WARM FLUID 44X44 (DRAPE) ×3 IMPLANT
ELECT REM PT RETURN 9FT ADLT (ELECTROSURGICAL) ×3
ELECTRODE REM PT RTRN 9FT ADLT (ELECTROSURGICAL) ×2 IMPLANT
EVACUATOR SMOKE 8.L (FILTER) ×3 IMPLANT
GAUZE VASELINE 3X9 (GAUZE/BANDAGES/DRESSINGS) ×1 IMPLANT
GLOVE BIO SURGEON STRL SZ 6.5 (GLOVE) ×6 IMPLANT
GLOVE BIO SURGEON STRL SZ7 (GLOVE) ×3 IMPLANT
GLOVE BIOGEL PI IND STRL 7.0 (GLOVE) ×2 IMPLANT
GLOVE BIOGEL PI INDICATOR 7.0 (GLOVE) ×3
GOWN STRL REIN XL XLG (GOWN DISPOSABLE) ×18 IMPLANT
KIT ACCESSORY DA VINCI DISP (KITS) ×1
KIT ACCESSORY DVNC DISP (KITS) ×2 IMPLANT
LEGGING LITHOTOMY PAIR STRL (DRAPES) ×3 IMPLANT
OCCLUDER COLPOPNEUMO (BALLOONS) ×1 IMPLANT
PACK LAVH (CUSTOM PROCEDURE TRAY) ×3 IMPLANT
PAD PREP 24X48 CUFFED NSTRL (MISCELLANEOUS) ×6 IMPLANT
PLUG CATH AND CAP STER (CATHETERS) ×3 IMPLANT
PROTECTOR NERVE ULNAR (MISCELLANEOUS) ×6 IMPLANT
SET IRRIG TUBING LAPAROSCOPIC (IRRIGATION / IRRIGATOR) ×5 IMPLANT
SOLUTION ELECTROLUBE (MISCELLANEOUS) ×3 IMPLANT
SUT VIC AB 4-0 PS2 27 (SUTURE) ×6 IMPLANT
SUT VICRYL 0 UR6 27IN ABS (SUTURE) ×6 IMPLANT
SUT VLOC 180 0 6IN GS21 (SUTURE) ×2 IMPLANT
SUT VLOC 180 0 9IN  GS21 (SUTURE) ×1
SUT VLOC 180 0 9IN GS21 (SUTURE) IMPLANT
SYR 50ML LL SCALE MARK (SYRINGE) ×3 IMPLANT
SYSTEM CONVERTIBLE TROCAR (TROCAR) ×1 IMPLANT
TIP UTERINE 6.7X10CM GRN DISP (MISCELLANEOUS) ×1 IMPLANT
TOWEL OR 17X24 6PK STRL BLUE (TOWEL DISPOSABLE) ×9 IMPLANT
TROCAR 12M 150ML BLUNT (TROCAR) ×1 IMPLANT
TROCAR DISP BLADELESS 8 DVNC (TROCAR) ×2 IMPLANT
TROCAR DISP BLADELESS 8MM (TROCAR) ×1
TROCAR XCEL NON-BLD 5MMX100MML (ENDOMECHANICALS) ×3 IMPLANT
TUBING FILTER THERMOFLATOR (ELECTROSURGICAL) ×5 IMPLANT
WARMER LAPAROSCOPE (MISCELLANEOUS) ×3 IMPLANT
WATER STERILE IRR 1000ML POUR (IV SOLUTION) ×9 IMPLANT

## 2013-02-09 NOTE — Anesthesia Preprocedure Evaluation (Signed)
Anesthesia Evaluation  Patient identified by MRN, date of birth, ID band Patient awake    Reviewed: Allergy & Precautions, H&P , Patient's Chart, lab work & pertinent test results, reviewed documented beta blocker date and time   History of Anesthesia Complications Negative for: history of anesthetic complications  Airway Mallampati: II TM Distance: >3 FB Neck ROM: full    Dental no notable dental hx.    Pulmonary neg pulmonary ROS,  breath sounds clear to auscultation  Pulmonary exam normal       Cardiovascular Exercise Tolerance: Good negative cardio ROS  Rhythm:regular Rate:Normal     Neuro/Psych negative neurological ROS  negative psych ROS   GI/Hepatic negative GI ROS, Neg liver ROS,   Endo/Other  negative endocrine ROS  Renal/GU negative Renal ROS     Musculoskeletal   Abdominal   Peds  Hematology negative hematology ROS (+)   Anesthesia Other Findings Bilateral pulmonary embolism 2009 3/13-last used lovenox Protein C deficiency        Endometriosis     Fibroids    Reproductive/Obstetrics negative OB ROS                           Anesthesia Physical Anesthesia Plan  ASA: II  Anesthesia Plan: General ETT   Post-op Pain Management:    Induction:   Airway Management Planned:   Additional Equipment:   Intra-op Plan:   Post-operative Plan:   Informed Consent: I have reviewed the patients History and Physical, chart, labs and discussed the procedure including the risks, benefits and alternatives for the proposed anesthesia with the patient or authorized representative who has indicated his/her understanding and acceptance.   Dental Advisory Given  Plan Discussed with: CRNA and Surgeon  Anesthesia Plan Comments:         Anesthesia Quick Evaluation

## 2013-02-09 NOTE — Transfer of Care (Signed)
Immediate Anesthesia Transfer of Care Note  Patient: [name] [name]  Procedure(s) Performed: Procedure(s): ROBOTIC ASSISTED TOTAL HYSTERECTOMY (N/A) UNILATERAL SALPINGECTOMY (Right)  Patient Location: PACU  Anesthesia Type:General  Level of Consciousness: awake, alert  and oriented  Airway & Oxygen Therapy: Patient Spontanous Breathing and Patient connected to nasal cannula oxygen  Post-op Assessment: Report given to PACU RN and Post -op Vital signs reviewed and stable  Post vital signs: Reviewed and stable  Complications: No apparent anesthesia complications

## 2013-02-09 NOTE — Anesthesia Postprocedure Evaluation (Signed)
Anesthesia Post Note  Patient: [name] [name]  Procedure(s) Performed: Procedure(s) (LRB): ROBOTIC ASSISTED TOTAL HYSTERECTOMY (N/A) UNILATERAL SALPINGECTOMY (Right)  Anesthesia type: General  Patient location: PACU  Post pain: Pain level controlled  Post assessment: Post-op Vital signs reviewed  Last Vitals:  Filed Vitals:   02/09/13 1630  BP: 101/61  Pulse: 67  Temp:   Resp: 12    Post vital signs: Reviewed  Level of consciousness: sedated  Complications: No apparent anesthesia complications

## 2013-02-09 NOTE — Op Note (Signed)
02/09/2013  3:59 PM  PATIENT:  [name] [name]  42 y.o. female  PRE-OPERATIVE DIAGNOSIS:  Pelvic Pain, Menometrorrhagia, Fibroids, Protein C Deficiency, Endometriosis    POST-OPERATIVE DIAGNOSIS:  Pelvic Pain, Menometrorrhagia, Fibroids, Protein C Deficiency, no Endometriosis seen, S/P LEFT PROXIMAL SALPINGECTOMY.    PROCEDURE:  Procedure(s): ROBOTIC ASSISTED TOTAL HYSTERECTOMY, RIGHT SALPINGECTOMY, LEFT DISTAL SALPINGECTOMY  SURGEON:  Surgeon(s): Genia Del, MD Robley Fries, MD  ASSISTANTS: Dr. Robley Fries   ANESTHESIA:   general  PROCEDURE:  Under general anesthesia with endotracheal intubation the patient is in lithotomy position. She is prepped with ChloraPrep on the abdomen and with Betadine on the suprapubic, vulvar and vaginal areas. She is draped as usual. The vaginal exam reveals an anteverted uterus increased in size and irregular, no adnexal mass. The weighted speculum is inserted in the vagina and the anterior lip of the cervix is grasped with a tenaculum, the hysterometry is at 10 cm.  A #10 roomy with the medium-size Coe ring are inserted easily. The other instruments are removed.  The Foley is put in place in the bladder. Abdominally, we infiltrate the subcutaneous tissue with Marcaine one quarter plain at the supraumbilical area. We make a 1.5 cm incision with the scalpel. We opened the aponeurosis with Mayo scissors under direct vision and the peritoneum bluntly with a finger. A pursestring stitch of Vicryl 0 is done on the aponeurosis. The Roseanne Reno is introduced at that level and a pneumoperitoneum is created with CO2. The camera is inserted.  Inspection of the abdominopelvic cavities reveals a normal liver and gallbladder are, normal appendix, normal right and left ovaries, normal right tube, the left tube is status post proximal salpingectomy and the uterus reveals multiple myomas with the largest at the fundus and a smaller one at the lower left anterior uterus.   We used a semicircular configuration for port placement. The sites are infiltrated with Marcaine one quarter plain and incisions are made with a scalpel. All ports are inserted under direct vision.  2 robotic ports on the right and one robotic ports on the lower left and the 5 mm assistant port on the upper left. The robot his docked on the right side. Instruments are inserted under direct vision with the Endo Shears scissor on the right arm the PK in the left arm and the fenestrated clamp in the third arm. We then go to the console. Both ureters are seen in normal anatomic position. No lesion of endometriosis is seen in the pelvis or abdomen. A small fine adhesion with the sigmoid colon is freed.  We starts on the left side, we cauterized and section the left round ligament.  We cauterized and section the left mesosalpinx and the distal part of the left tube is removed from the cavity.  We then cauterized and section the left utero-ovarian ligament. We opened the anterior peritoneum and start descending the bladder passed the coe ring anteriorly.  We then go to the right side we cauterized and sectioned the right round ligament. We cauterized and sectioned the right mesosalpinx. We cauterized and section the right utero-ovarian ligament. We then completed the opening of the anterior peritoneum and descend the bladder further passed the coring.  We then cauterize the right uterine artery.  We then cauterized and section the left uterine artery and came back to section the right uterine artery. We then inflated the occluder. We opened the upper aspect of the vaginal vault following the top of the coring with the  Endo Youth worker.  The uterus was completely detached. It was passed vaginally and sent to pathology.  The specimen included the cervix the uterus the right tube and what was left of the left tube, the distal portion of the left tube.  We then switched instruments to the cutting needle driver in the right  arm, the long tip in the left arm and the PK in the third arm.  We used a V. LOC 9 inches to close the vaginal vault. We started on the right ankle and ran the the closure to the left angle.  We came back on our steps to about mid vagina.  We then used a V. LOC 6 inches to proceed with suspension of the uterosacral ligaments.  We took a bite on the medial side of the uterosacral ligament on the left and fixed it to the left angle of the vagina.  We then took a bite in the middle of the vaginal vault, and went to the medial side of the right uterosacral ligament to then fixe it to the right angle of the vagina.  We then took another 6 inches V. LOC and  reapproximated the uterosacral ligaments on the midline with a running suture.  This produced a very good suspension of the vagina.  Hemostasis was adequate at all levels.  The needles were parked on the right abdominal wall.  Both ureters showed excellent peristalsis.  We therefore removed all robotic instruments. We undocked the robot. And went by laparoscopy. The 3 needles were removed through the robotic port.  We then irrigated and suctioned the abdominopelvic cavities. Confirmed good hemostasis. Removed all laparoscopy instruments. Removed all ports under direct vision.  Evacuated the CO2. The supraumbilical incision was closed by attaching the pursestring stitch. We then completed hemostasis with the electrocautery on all incisions. We closed the skin with a subcuticular stitch of Vicryl 40 on all incisions. We completed with Dermabond on all incisions. The occluder was removed from the vagina. The patient was brought to recovery room in good and stable status.  ESTIMATED BLOOD LOSS: 75 cc   Intake/Output Summary (Last 24 hours) at 02/09/13 1559 Last data filed at 02/09/13 1500  Gross per 24 hour  Intake   1000 ml  Output    375 ml  Net    625 ml     BLOOD ADMINISTERED:none   LOCAL MEDICATIONS USED:  MARCAINE     SPECIMEN:  Source of Specimen:   Uterus with cervix, right tube and left distal tube  DISPOSITION OF SPECIMEN:  PATHOLOGY  COUNTS:  YES  PLAN OF CARE: Transfer to PACU   Genia Del MD  02/09/2013 at 4:00 pm

## 2013-02-09 NOTE — H&P (Signed)
[name] [name] is an 42 y.o. female  [id number]  RP:  TLH, bilateral salpingectomies assisted with Engineer, building services for pelvic pain/menorrhagia with h/o Endometriosis and myomas  Pertinent Gynecological History: Menses: flow is excessive with use of many pads or tampons on heaviest days Contraception: condoms Blood transfusions: none Sexually transmitted diseases: no past history Previous GYN Procedures: LPS x 3 for endometriosis, recent TAB 1st trimester. Last mammogram: normal Last pap: normal   Menstrual History:  No LMP recorded. Patient is not currently having periods (Reason: Other).    Past Medical History  Diagnosis Date  . Bilateral pulmonary embolism 2009    3/13-last used lovenox  . Protein C deficiency   . Endometriosis   . Fibroids   . At high risk for complications of intrauterine pregnancy (IUP)     8 week    Past Surgical History  Procedure Laterality Date  . Diagnostic laparoscopy  2004    x3  . Knee arthroscopy    . Dilation and evacuation N/A 01/28/2013    Procedure: DILATATION AND EVACUATION;  Surgeon: Genia Del, MD;  Location: [facility];  Service: Gynecology;  Laterality: N/A;    History reviewed. No pertinent family history.  Social History:  reports that she has been smoking Cigarettes.  She has a 1 pack-year smoking history. She has never used smokeless tobacco. She reports that she does not drink alcohol or use illicit drugs.  Allergies: No Known Allergies  Prescriptions prior to admission  Medication Sig Dispense Refill  . HYDROcodone-acetaminophen (NORCO/VICODIN) 5-325 MG per tablet Take 0.5 tablets by mouth daily as needed for pain (has only taken 1.5 tablets this month).        Blood pressure 106/80, pulse 77, temperature 97.7 F (36.5 C), temperature source Oral, resp. rate 18, SpO2 100.00%.  [place]:  Uterine myomas (volume 125), ovaries wnl.  Results for orders placed during the hospital encounter of 02/09/13 (from the past 24 hour(s))   PREGNANCY, URINE     Status: Abnormal   Collection Time    02/09/13 11:20 AM      Result Value Range   Preg Test, Ur POSITIVE (*) NEGATIVE  TYPE AND SCREEN     Status: None   Collection Time    02/09/13 11:25 AM      Result Value Range   ABO/RH(D) O POS     Antibody Screen NEG     Sample Expiration 02/12/2013      No results found.  Assessment/Plan: Pelvic pain/menorrhagia, h/o Endometriosis, myomas for TLH, bilat. Salpingectomies assisted with [employer].  Surgery and risks reviewed.  H/O PE, Prtn C deficiency, prophylaxis with Heparin.  Mizuki Hoel,MARIE-LYNE 02/09/2013, 1:00 PM

## 2013-02-09 NOTE — Discharge Summary (Signed)
  Physician Discharge Summary  Patient ID: [name] [name] MRN: [id number] DOB/AGE: 08/15/1971 42 y.o.  Admit date: 02/09/2013 Discharge date: 02/09/2013  Admission Diagnoses: Pelvic Pain, Menometrorrhagia, Fibroids, Protein C Deficiency, Endometriosis  [address]  Discharge Diagnoses: Same, except no Endometriosis seen and s/p left proximal salpingectomy.        Active Problems:   * No active hospital problems. *   Discharged Condition: good  Hospital Course: Outpatient  Consults: None  Treatments: surgery: Total laparoscopic hysterectomy with right salpingectomy, left distal salpingectomy and Uterosacral ligament suspension assisted with the [employer] robot.  Disposition: 01-Home or Self Care     Medication List    STOP taking these medications       HYDROcodone-acetaminophen 5-325 MG per tablet  Commonly known as:  NORCO/VICODIN      TAKE these medications       oxyCODONE-acetaminophen 7.5-325 MG per tablet  Commonly known as:  PERCOCET  Take 1 tablet by mouth every 4 (four) hours as needed for pain.           Follow-up Information   Follow up with Demorio Seeley,MARIE-LYNE, MD In 3 weeks.   Contact information:   [address] [place] [address] [phone]       Signed: Genia Del, MD 02/09/2013, 4:31 PM

## 2013-02-10 ENCOUNTER — Encounter (HOSPITAL_COMMUNITY): Payer: Self-pay | Admitting: Obstetrics & Gynecology

## 2013-02-10 MED ORDER — ONDANSETRON 8 MG/NS 50 ML IVPB
8.0000 mg | Freq: Three times a day (TID) | INTRAVENOUS | Status: DC | PRN
  Administered 2013-02-10: 8 mg via INTRAVENOUS
  Filled 2013-02-10: qty 8

## 2013-02-10 MED ORDER — OXYCODONE-ACETAMINOPHEN 5-325 MG PO TABS
1.0000 | ORAL_TABLET | ORAL | Status: DC | PRN
  Administered 2013-02-10 (×2): 2 via ORAL
  Filled 2013-02-10 (×2): qty 2

## 2013-02-10 NOTE — Progress Notes (Signed)
Discharge instructions reviewed with patient.  Patient states understanding of home care, signs/symptoms to report to MD, medications, activity and return MD appointment.  No home equipment needed.  Patient discharged in wheelchair to car with staff in stable condition and without incident.

## 2013-02-10 NOTE — Progress Notes (Signed)
1 Day Post-Op Procedure(s) (LRB): ROBOTIC ASSISTED TOTAL HYSTERECTOMY (N/A) UNILATERAL SALPINGECTOMY (Right)  Subjective: Patient reports that pain is well managed.  Tolerating normal diet as tolerated  diet without difficulty. No nausea / vomiting.  Ambulating and voiding.  Objective: BP 93/55  Pulse 61  Temp(Src) 98 F (36.7 C) (Oral)  Resp 18  Ht 5\' 7"  (1.702 m)  Wt 75.297 kg (166 lb)  BMI 25.99 kg/m2  SpO2 99% Lungs: clear Heart: normal rate and rhythm Abdomen:soft and appropriately tender Extremities: Homans sign is negative, no sign of DVT Incision: healing well  Post op Hb 12.9  Assessment: s/p Procedure(s): ROBOTIC ASSISTED TOTAL HYSTERECTOMY UNILATERAL SALPINGECTOMY: progressing well  Plan: Discharge home  LOS: 1 day    Chesney Klimaszewski,MARIE-LYNE 02/10/2013, 1:25 PM

## 2013-02-10 NOTE — Anesthesia Postprocedure Evaluation (Signed)
  Anesthesia Post-op Note  Patient: [name] [name]  Procedure(s) Performed: Procedure(s): ROBOTIC ASSISTED TOTAL HYSTERECTOMY (N/A) UNILATERAL SALPINGECTOMY (Right)  Patient Location: [facility]  Anesthesia Type:General  Level of Consciousness: awake, alert  and oriented  Airway and Oxygen Therapy: Patient Spontanous Breathing  Post-op Pain: mild  Post-op Assessment: Post-op Vital signs reviewed and Patient's Cardiovascular Status Stable  Post-op Vital Signs: Reviewed and stable  Complications: No apparent anesthesia complications

## 2013-04-16 ENCOUNTER — Emergency Department (HOSPITAL_COMMUNITY)
Admission: EM | Admit: 2013-04-16 | Discharge: 2013-04-16 | Disposition: A | Discharge status: Home / Self Care | Payer: BC Managed Care – PPO | Attending: Emergency Medicine | Admitting: Emergency Medicine

## 2013-04-16 ENCOUNTER — Encounter (HOSPITAL_COMMUNITY): Payer: Self-pay | Admitting: *Deleted

## 2013-04-16 DIAGNOSIS — W57XXXA Bitten or stung by nonvenomous insect and other nonvenomous arthropods, initial encounter: Secondary | ICD-10-CM

## 2013-04-16 DIAGNOSIS — Z862 Personal history of diseases of the blood and blood-forming organs and certain disorders involving the immune mechanism: Secondary | ICD-10-CM | POA: Insufficient documentation

## 2013-04-16 DIAGNOSIS — Z8742 Personal history of other diseases of the female genital tract: Secondary | ICD-10-CM | POA: Insufficient documentation

## 2013-04-16 DIAGNOSIS — Y939 Activity, unspecified: Secondary | ICD-10-CM | POA: Insufficient documentation

## 2013-04-16 DIAGNOSIS — Y92009 Unspecified place in unspecified non-institutional (private) residence as the place of occurrence of the external cause: Secondary | ICD-10-CM | POA: Insufficient documentation

## 2013-04-16 DIAGNOSIS — Z889 Allergy status to unspecified drugs, medicaments and biological substances status: Secondary | ICD-10-CM

## 2013-04-16 DIAGNOSIS — Z86711 Personal history of pulmonary embolism: Secondary | ICD-10-CM | POA: Insufficient documentation

## 2013-04-16 DIAGNOSIS — F17200 Nicotine dependence, unspecified, uncomplicated: Secondary | ICD-10-CM | POA: Insufficient documentation

## 2013-04-16 DIAGNOSIS — L089 Local infection of the skin and subcutaneous tissue, unspecified: Secondary | ICD-10-CM | POA: Insufficient documentation

## 2013-04-16 DIAGNOSIS — Z8639 Personal history of other endocrine, nutritional and metabolic disease: Secondary | ICD-10-CM | POA: Insufficient documentation

## 2013-04-16 LAB — BASIC METABOLIC PANEL
BUN: 11 mg/dL (ref 6–23)
CO2: 27 mEq/L (ref 19–32)
Calcium: 8.9 mg/dL (ref 8.4–10.5)
Glucose, Bld: 90 mg/dL (ref 70–99)
Potassium: 4.3 mEq/L (ref 3.5–5.1)
Sodium: 138 mEq/L (ref 135–145)

## 2013-04-16 LAB — CBC
HCT: 38 % (ref 36.0–46.0)
Hemoglobin: 13.7 g/dL (ref 12.0–15.0)
MCH: 30.9 pg (ref 26.0–34.0)
RBC: 4.43 MIL/uL (ref 3.87–5.11)

## 2013-04-16 MED ORDER — HYDROCODONE-ACETAMINOPHEN 5-325 MG PO TABS: 1.0000 | ORAL_TABLET | Freq: Four times a day (QID) | ORAL | Status: DC | PRN

## 2013-04-16 MED ORDER — CLINDAMYCIN HCL 300 MG PO CAPS: 300.0000 mg | ORAL_CAPSULE | Freq: Four times a day (QID) | ORAL | Status: DC

## 2013-04-16 MED ORDER — FAMOTIDINE IN NACL 20-0.9 MG/50ML-% IV SOLN
20.0000 mg | Freq: Once | INTRAVENOUS | Status: AC
  Administered 2013-04-16: 20 mg via INTRAVENOUS
  Filled 2013-04-16: qty 50

## 2013-04-16 MED ORDER — PREDNISONE 20 MG PO TABS: 60.0000 mg | ORAL_TABLET | Freq: Every day | ORAL | Status: DC

## 2013-04-16 MED ORDER — ONDANSETRON 4 MG PO TBDP
4.0000 mg | ORAL_TABLET | Freq: Once | ORAL | Status: AC
  Administered 2013-04-16: 4 mg via ORAL
  Filled 2013-04-16: qty 1

## 2013-04-16 MED ORDER — NAPROXEN 500 MG PO TABS: 500.0000 mg | ORAL_TABLET | Freq: Two times a day (BID) | ORAL | Status: DC

## 2013-04-16 MED ORDER — HYDROMORPHONE HCL PF 1 MG/ML IJ SOLN
1.0000 mg | Freq: Once | INTRAMUSCULAR | Status: AC
Start: 2013-04-16 — End: 2013-04-16
  Administered 2013-04-16: 1 mg via INTRAVENOUS
  Filled 2013-04-16: qty 1

## 2013-04-16 MED ORDER — PREDNISONE 20 MG PO TABS
60.0000 mg | ORAL_TABLET | Freq: Once | ORAL | Status: AC
  Administered 2013-04-16: 60 mg via ORAL
  Filled 2013-04-16: qty 3

## 2013-04-16 MED ORDER — SODIUM CHLORIDE 0.9 % IV SOLN
INTRAVENOUS | Status: DC
  Administered 2013-04-16: 17:00:00 via INTRAVENOUS

## 2013-04-16 MED ORDER — DIPHENHYDRAMINE HCL 50 MG/ML IJ SOLN
50.0000 mg | Freq: Once | INTRAMUSCULAR | Status: AC
  Administered 2013-04-16: 50 mg via INTRAVENOUS
  Filled 2013-04-16: qty 1

## 2013-04-16 MED ORDER — CLINDAMYCIN PHOSPHATE 600 MG/50ML IV SOLN
600.0000 mg | Freq: Once | INTRAVENOUS | Status: AC
  Administered 2013-04-16: 600 mg via INTRAVENOUS
  Filled 2013-04-16: qty 50

## 2013-04-16 NOTE — ED Provider Notes (Signed)
Medical screening examination/treatment/procedure(s) were conducted as a shared visit with non-physician practitioner(s) and myself.  I personally evaluated the patient during the encounter   Rolan Bucco, MD 04/16/13 2248

## 2013-04-16 NOTE — ED Notes (Signed)
Reports waking up yesterday with small bug bite to right arm, now has swelling and pain to right forearm. Redness and warmth noted.

## 2013-04-16 NOTE — ED Notes (Signed)
PA at bedside.

## 2013-04-16 NOTE — ED Notes (Signed)
Pt states she thinks she got bit by a bug. She noticed a bite yesterday, today her right elbow is warm, errythemous and swollen. Pt c/o pain to joint and limited movement, and states she has taken percocet at home with no relief.

## 2013-04-16 NOTE — ED Provider Notes (Signed)
History     CSN: [id number]  Arrival date & time 04/16/13  1314   First MD Initiated Contact with Patient 04/16/13 1525      Chief Complaint  Patient presents with  . Abscess    (Consider location/radiation/quality/duration/timing/severity/associated sxs/prior treatment) HPI Comments: Pt to ER s/p feeling bug bite followed by arm swelling, pruritis, warmth & pain. Protein C deficiency w hx of bilateral PE.   Patient is a 42 y.o. female presenting with animal bite. The history is provided by the patient.  Animal Bite  The incident occurred yesterday. The incident occurred at home. She came to the ER via personal transport. Head/neck injury location: right elbow. The pain is moderate. It is unlikely that a foreign body is present. Pertinent negatives include no chest pain, no fussiness, no numbness, no abdominal pain, no bowel incontinence, no nausea, no vomiting, no headaches, no hearing loss, no inability to bear weight, no neck pain, no pain when bearing weight, no focal weakness, no decreased responsiveness, no light-headedness, no loss of consciousness, no seizures, no tingling, no weakness, no cough, no difficulty breathing and no memory loss. There have been no prior injuries to these areas. She is right-handed. Her tetanus status is UTD. There were no sick contacts. She has received no recent medical care.    Past Medical History  Diagnosis Date  . Bilateral pulmonary embolism 2009    3/13-last used lovenox  . Protein C deficiency   . Endometriosis   . Fibroids   . At high risk for complications of intrauterine pregnancy (IUP)     8 week    Past Surgical History  Procedure Laterality Date  . Diagnostic laparoscopy  2004    x3  . Knee arthroscopy    . Dilation and evacuation N/A 01/28/2013    Procedure: DILATATION AND EVACUATION;  Surgeon: Genia Del, MD;  Location: [facility];  Service: Gynecology;  Laterality: N/A;  . Robotic assisted total hysterectomy N/A 02/09/2013     Procedure: ROBOTIC ASSISTED TOTAL HYSTERECTOMY;  Surgeon: Genia Del, MD;  Location: [facility];  Service: Gynecology;  Laterality: N/A;  . Unilateral salpingectomy Right 02/09/2013    Procedure: UNILATERAL SALPINGECTOMY;  Surgeon: Genia Del, MD;  Location: [facility];  Service: Gynecology;  Laterality: Right;  . Abdominal hysterectomy      History reviewed. No pertinent family history.  History  Substance Use Topics  . Smoking status: Current Every Day Smoker -- 0.25 packs/day for 4 years    Types: Cigarettes  . Smokeless tobacco: Never Used  . Alcohol Use: No    OB History   Grav Para Term Preterm Abortions TAB SAB Ect Mult Living                  Review of Systems  Constitutional: Negative for decreased responsiveness.  HENT: Negative for hearing loss and neck pain.   Respiratory: Negative for cough.   Cardiovascular: Negative for chest pain.  Gastrointestinal: Negative for nausea, vomiting, abdominal pain and bowel incontinence.  Skin: Positive for rash.  Neurological: Negative for tingling, focal weakness, seizures, loss of consciousness, weakness, light-headedness, numbness and headaches.  Psychiatric/Behavioral: Negative for memory loss.  All other systems reviewed and are negative.    Allergies  Review of patient's allergies indicates no known allergies.  Home Medications  No current outpatient prescriptions on file.  BP 113/71  Pulse 78  Temp(Src) 97.5 F (36.4 C) (Oral)  Resp 16  SpO2 100%  Physical Exam  Nursing note and  vitals reviewed. Constitutional: She is oriented to person, place, and time. She appears well-developed and well-nourished. No distress.  HENT:  Head: Normocephalic and atraumatic.  Eyes: Conjunctivae and EOM are normal.  Neck: Normal range of motion. Neck supple.  Cardiovascular:  Intact distal pulses, capillary refill < 3 seconds  Pulmonary/Chest: Effort normal.  Musculoskeletal: Normal range of motion.  Pain w flexion  extension of right elbow. No bony ttp.All other extremities with normal ROM  Neurological: She is alert and oriented to person, place, and time.  No sensory deficit  Skin: Skin is warm and dry. Rash noted. She is not diaphoretic.  Right posterior elbow w warmth erythema and swelling Central area of small urticaria.  Non pitting  Psychiatric: She has a normal mood and affect. Her behavior is normal.    ED Course  Procedures (including critical care time)  Labs Reviewed  CBC  BASIC METABOLIC PANEL   No results found.  Medications  HYDROmorphone (DILAUDID) injection 1 mg (not administered)  ondansetron (ZOFRAN-ODT) disintegrating tablet 4 mg (not administered)  0.9 %  sodium chloride infusion (not administered)  diphenhydrAMINE (BENADRYL) injection 50 mg (not administered)  famotidine (PEPCID) IVPB 20 mg (not administered)  clindamycin (CLEOCIN) IVPB 600 mg (not administered)  predniSONE (DELTASONE) tablet 60 mg (not administered)     No diagnosis found.   BP 113/71  Pulse 78  Temp(Src) 97.5 F (36.4 C) (Oral)  Resp 16  SpO2 100%  MDM  Bug bite, likely localized allergic reaction  Patient in the emergency department status post feeling small bug bite yesterday.  Since the incident patient's posterior right elbow has become warm, swollen, pruritic and painful.  Likely etiology is a localized allergic reaction, however since we're unable to rule out possible cellulitis at this time patient will be treated both for allergic reaction and superficial skin infection.  Strict return precautions discussed.  Medications given in the emergency department as above. Patient will be discharged with antibiotics and short burst of steroids.  Case discussed with attending who agrees with plan.          Jaci Carrel, [place] 04/16/13 1819

## 2013-08-31 ENCOUNTER — Emergency Department (HOSPITAL_COMMUNITY)
Admission: EM | Admit: 2013-08-31 | Discharge: 2013-08-31 | Disposition: A | Discharge status: Home / Self Care | Payer: BC Managed Care – PPO | Attending: Emergency Medicine | Admitting: Emergency Medicine

## 2013-08-31 ENCOUNTER — Encounter (HOSPITAL_COMMUNITY): Payer: Self-pay | Admitting: Emergency Medicine

## 2013-08-31 DIAGNOSIS — M549 Dorsalgia, unspecified: Secondary | ICD-10-CM

## 2013-08-31 DIAGNOSIS — M5430 Sciatica, unspecified side: Secondary | ICD-10-CM | POA: Insufficient documentation

## 2013-08-31 DIAGNOSIS — M5432 Sciatica, left side: Secondary | ICD-10-CM

## 2013-08-31 DIAGNOSIS — F17200 Nicotine dependence, unspecified, uncomplicated: Secondary | ICD-10-CM | POA: Insufficient documentation

## 2013-08-31 DIAGNOSIS — Z862 Personal history of diseases of the blood and blood-forming organs and certain disorders involving the immune mechanism: Secondary | ICD-10-CM | POA: Insufficient documentation

## 2013-08-31 DIAGNOSIS — Z8742 Personal history of other diseases of the female genital tract: Secondary | ICD-10-CM | POA: Insufficient documentation

## 2013-08-31 DIAGNOSIS — Z86718 Personal history of other venous thrombosis and embolism: Secondary | ICD-10-CM | POA: Insufficient documentation

## 2013-08-31 DIAGNOSIS — Z7982 Long term (current) use of aspirin: Secondary | ICD-10-CM | POA: Insufficient documentation

## 2013-08-31 DIAGNOSIS — Z8639 Personal history of other endocrine, nutritional and metabolic disease: Secondary | ICD-10-CM | POA: Insufficient documentation

## 2013-08-31 MED ORDER — CYCLOBENZAPRINE HCL 10 MG PO TABS
10.0000 mg | ORAL_TABLET | Freq: Once | ORAL | Status: AC
  Administered 2013-08-31: 10 mg via ORAL
  Filled 2013-08-31: qty 1

## 2013-08-31 MED ORDER — HYDROCODONE-ACETAMINOPHEN 5-325 MG PO TABS: 1.0000 | ORAL_TABLET | Freq: Four times a day (QID) | ORAL | Status: DC | PRN

## 2013-08-31 MED ORDER — DIAZEPAM 5 MG PO TABS: 5.0000 mg | ORAL_TABLET | Freq: Two times a day (BID) | ORAL | Status: DC

## 2013-08-31 MED ORDER — HYDROCODONE-ACETAMINOPHEN 5-325 MG PO TABS
2.0000 | ORAL_TABLET | Freq: Once | ORAL | Status: AC
  Administered 2013-08-31: 2 via ORAL
  Filled 2013-08-31: qty 2

## 2013-08-31 MED ORDER — PREDNISONE 20 MG PO TABS: ORAL_TABLET | ORAL | Status: DC

## 2013-08-31 NOTE — ED Notes (Signed)
Pt c/o lower back pain x 3 weeks with radiation down left leg

## 2013-08-31 NOTE — ED Provider Notes (Signed)
Medical screening examination/treatment/procedure(s) were performed by non-physician practitioner and as supervising physician I was immediately available for consultation/collaboration.  Donnetta Hutching, MD 08/31/13 2229

## 2013-08-31 NOTE — ED Notes (Signed)
Pt presents with left lower back pain that radiates down left leg for the past 3 weeks.  Pt states she had surgery on her back in 2000- denies any problems since.

## 2013-08-31 NOTE — ED Provider Notes (Signed)
CSN: [id number]     Arrival date & time 08/31/13  1546 History  This chart was scribed for non-physician practitioner, Johnnette Gourd, PA, working with Donnetta Hutching, MD, by [facility] ED Scribe. This patient was seen in room TR06C/TR06C and the patient's care was started at 5:30 PM.    Chief Complaint  Patient presents with  . Back Pain    The history is provided by the patient. No language interpreter was used.   HPI Comments: [name] [name] is a 42 y.o. female who presents to the Emergency Department complaining of gradually worseniing, constant, moderate lower back pain onset 3 weeks ago. She describes her pain as "sharp" and "stabbing" and states that the pain radiates to her left leg. She states that she had a back injury in the [employer] about 20 years ago, but no injury since. She states that she had a minor back operation 12 years ago. She also states that she has had 4 left knee surgeries in the past. She states that she has taken arthritis medication without relief. She denies fever, chills, nght sweats, numbness or tingling down her legs, bowel or bladder incontinence, saddle anaesthesia or any other symptoms.   Past Medical History  Diagnosis Date  . Bilateral pulmonary embolism 2009    3/13-last used lovenox  . Protein C deficiency   . Endometriosis   . Fibroids   . At high risk for complications of intrauterine pregnancy (IUP)     8 week   Past Surgical History  Procedure Laterality Date  . Diagnostic laparoscopy  2004    x3  . Knee arthroscopy    . Dilation and evacuation N/A 01/28/2013    Procedure: DILATATION AND EVACUATION;  Surgeon: Genia Del, MD;  Location: [facility];  Service: Gynecology;  Laterality: N/A;  . Robotic assisted total hysterectomy N/A 02/09/2013    Procedure: ROBOTIC ASSISTED TOTAL HYSTERECTOMY;  Surgeon: Genia Del, MD;  Location: [facility];  Service: Gynecology;  Laterality: N/A;  . Unilateral salpingectomy Right 02/09/2013    Procedure:  UNILATERAL SALPINGECTOMY;  Surgeon: Genia Del, MD;  Location: [facility];  Service: Gynecology;  Laterality: Right;  . Abdominal hysterectomy     History reviewed. No pertinent family history. History  Substance Use Topics  . Smoking status: Current Every Day Smoker -- 0.25 packs/day for 4 years    Types: Cigarettes  . Smokeless tobacco: Never Used  . Alcohol Use: No   OB History   Grav Para Term Preterm Abortions TAB SAB Ect Mult Living                 Review of Systems  Constitutional: Negative for fever, chills and diaphoresis.  Gastrointestinal:       Denies bowel incontinence.  Genitourinary:       Denies bladder incontinence.  Musculoskeletal: Positive for back pain.  Neurological: Negative for weakness and numbness.  All other systems reviewed and are negative.   Allergies  Review of patient's allergies indicates no known allergies.  Home Medications   Current Outpatient Rx  Name  Route  Sig  Dispense  Refill  . Aspirin-Caffeine (BC FAST PAIN RELIEF ARTHRITIS PO)   Oral   Take 1 packet by mouth daily as needed (pain).          Triage Vitals: BP 113/70  Pulse 86  Temp(Src) 97.5 F (36.4 C) (Oral)  Resp 16  SpO2 98%  Physical Exam  Nursing note and vitals reviewed. Constitutional: She is oriented to person,  place, and time. She appears well-developed and well-nourished. No distress.  HENT:  Head: Normocephalic and atraumatic.  Mouth/Throat: Oropharynx is clear and moist.  Eyes: Conjunctivae and EOM are normal.  Neck: Normal range of motion. Neck supple.  Cardiovascular: Normal rate, regular rhythm and normal heart sounds.   Pulmonary/Chest: Effort normal and breath sounds normal. No respiratory distress.  Musculoskeletal: Normal range of motion. She exhibits no edema.  Tenderness to left paralumbar muscles and into her buttock. Normal gait. Able to perform toe and heel.   Neurological: She is alert and oriented to person, place, and time. No  sensory deficit.  5/5 strength in lower extremities. Sensation intact.  Skin: Skin is warm and dry.  Psychiatric: She has a normal mood and affect. Her behavior is normal.    ED Course  Procedures (including critical care time)  DIAGNOSTIC STUDIES: Oxygen Saturation is 98% on RA, normal by my interpretation.    COORDINATION OF CARE: 5:37 PM- Pt advised of clinical suspicion that her pain is due to sciatica. Advised pt of plan to receive Norco and Flexeril and pt agreed to plan.  Labs Review Labs Reviewed - No data to display Imaging Review No results found.  MDM   1. Back pain   2. Sciatica, left    Patient with back pain radiating down left leg, consistent with sciatica. No red flags concerning patient's back pain. No s/s of central cord compression or cauda equina. Lower extremities are neurovascularly intact and patient is ambulating without difficulty. She will be discharged with a short course of prednisone, Valium and Vicodin. She will followup with her primary care physician at the [place]. Close return precautions discussed. Patient states understanding of plan and is agreeable.   I personally performed the services described in this documentation, which was scribed in my presence. The recorded information has been reviewed and is accurate.    Trevor Mace, PA-C 08/31/13 1759

## 2013-11-14 ENCOUNTER — Emergency Department (HOSPITAL_COMMUNITY)
Admission: EM | Admit: 2013-11-14 | Discharge: 2013-11-15 | Disposition: A | Discharge status: Home / Self Care | Payer: BC Managed Care – PPO | Attending: Emergency Medicine | Admitting: Emergency Medicine

## 2013-11-14 ENCOUNTER — Encounter (HOSPITAL_COMMUNITY): Payer: Self-pay | Admitting: Emergency Medicine

## 2013-11-14 ENCOUNTER — Emergency Department (HOSPITAL_COMMUNITY)
Admission: EM | Admit: 2013-11-14 | Discharge: 2013-11-14 | Discharge status: Against Medical Advice | Payer: BC Managed Care – PPO | Attending: Emergency Medicine | Admitting: Emergency Medicine

## 2013-11-14 DIAGNOSIS — M79609 Pain in unspecified limb: Secondary | ICD-10-CM | POA: Insufficient documentation

## 2013-11-14 DIAGNOSIS — Z86711 Personal history of pulmonary embolism: Secondary | ICD-10-CM | POA: Insufficient documentation

## 2013-11-14 DIAGNOSIS — M79605 Pain in left leg: Secondary | ICD-10-CM

## 2013-11-14 DIAGNOSIS — D6859 Other primary thrombophilia: Secondary | ICD-10-CM | POA: Insufficient documentation

## 2013-11-14 DIAGNOSIS — M7989 Other specified soft tissue disorders: Secondary | ICD-10-CM

## 2013-11-14 DIAGNOSIS — Z8742 Personal history of other diseases of the female genital tract: Secondary | ICD-10-CM | POA: Insufficient documentation

## 2013-11-14 DIAGNOSIS — F17200 Nicotine dependence, unspecified, uncomplicated: Secondary | ICD-10-CM | POA: Insufficient documentation

## 2013-11-14 DIAGNOSIS — R350 Frequency of micturition: Secondary | ICD-10-CM | POA: Insufficient documentation

## 2013-11-14 DIAGNOSIS — IMO0002 Reserved for concepts with insufficient information to code with codable children: Secondary | ICD-10-CM | POA: Insufficient documentation

## 2013-11-14 DIAGNOSIS — Z862 Personal history of diseases of the blood and blood-forming organs and certain disorders involving the immune mechanism: Secondary | ICD-10-CM | POA: Insufficient documentation

## 2013-11-14 DIAGNOSIS — Z79899 Other long term (current) drug therapy: Secondary | ICD-10-CM | POA: Insufficient documentation

## 2013-11-14 DIAGNOSIS — M549 Dorsalgia, unspecified: Secondary | ICD-10-CM | POA: Insufficient documentation

## 2013-11-14 DIAGNOSIS — M533 Sacrococcygeal disorders, not elsewhere classified: Secondary | ICD-10-CM | POA: Insufficient documentation

## 2013-11-14 MED ORDER — HYDROCODONE-ACETAMINOPHEN 5-325 MG PO TABS
1.0000 | ORAL_TABLET | Freq: Once | ORAL | Status: AC
  Administered 2013-11-14: 1 via ORAL
  Filled 2013-11-14: qty 1

## 2013-11-14 MED ORDER — ENOXAPARIN SODIUM 100 MG/ML ~~LOC~~ SOLN
75.0000 mg | Freq: Once | SUBCUTANEOUS | Status: AC
  Administered 2013-11-15: 75 mg via SUBCUTANEOUS
  Filled 2013-11-14: qty 1

## 2013-11-14 NOTE — ED Notes (Signed)
The pt told registration that she was leaving   Go home and call an ambulance to come back and be seen faster.  The pt and   Friend have been to this desk x 4 in the past 30 minutes

## 2013-11-14 NOTE — ED Notes (Signed)
Pt. reports progressing pain at left upper /posterior thigh and left buttock onset last Thursday this week after a long drive to [place] , denies injury or fall . Pt. Has a history of PE , respirations unlabored.

## 2013-11-14 NOTE — ED Provider Notes (Signed)
CSN: [id number]     Arrival date & time 11/14/13  2329 History   First MD Initiated Contact with Patient 11/14/13 2336     Chief Complaint  Patient presents with  . Leg Pain   (Consider location/radiation/quality/duration/timing/severity/associated sxs/prior Treatment) HPI Comments: Pt with h/o PE.  Pt used to be in [place], was seeing a hemoatologist there, has known protein C and S deficiency, h/o PE's.  Pt has not been on lovenox in over a year.  Pt was treated with lovenox due to pregnancy, was never on coumadin apparently.  Pt has driven to [facility] and back recently, but has had had symptoms previously.  Pt has h/o sciatica as well, although doesn't think this feels like her sciatica although she complains of some left buttock and gluteal discomfort, hard to sit in one position for long.  She denies weakness, rash, fevers.  No CP, SOB, pleurisy.  She has not taken any meds for the pain.    Patient is a 42 y.o. female presenting with leg pain. The history is provided by the patient.  Leg Pain Location:  Leg Time since incident:  3 days Injury: no   Leg location:  L leg Pain details:    Quality:  Cramping   Radiates to:  Groin   Severity:  Moderate   Onset quality:  Gradual Chronicity:  New Relieved by:  Nothing Ineffective treatments:  None tried Associated symptoms: back pain   Associated symptoms: no fever     Past Medical History  Diagnosis Date  . Bilateral pulmonary embolism 2009    3/13-last used lovenox  . Protein C deficiency   . Endometriosis   . Fibroids   . At high risk for complications of intrauterine pregnancy (IUP)     8 week   Past Surgical History  Procedure Laterality Date  . Diagnostic laparoscopy  2004    x3  . Knee arthroscopy    . Dilation and evacuation N/A 01/28/2013    Procedure: DILATATION AND EVACUATION;  Surgeon: Genia Del, MD;  Location: [facility];  Service: Gynecology;  Laterality: N/A;  . Robotic assisted total hysterectomy N/A  02/09/2013    Procedure: ROBOTIC ASSISTED TOTAL HYSTERECTOMY;  Surgeon: Genia Del, MD;  Location: [facility];  Service: Gynecology;  Laterality: N/A;  . Unilateral salpingectomy Right 02/09/2013    Procedure: UNILATERAL SALPINGECTOMY;  Surgeon: Genia Del, MD;  Location: [facility];  Service: Gynecology;  Laterality: Right;  . Abdominal hysterectomy     History reviewed. No pertinent family history. History  Substance Use Topics  . Smoking status: Current Every Day Smoker -- 0.25 packs/day for 4 years    Types: Cigarettes  . Smokeless tobacco: Never Used  . Alcohol Use: No   OB History   Grav Para Term Preterm Abortions TAB SAB Ect Mult Living                 Review of Systems  Constitutional: Negative for fever and chills.  Respiratory: Negative for shortness of breath.   Cardiovascular: Positive for leg swelling. Negative for chest pain and palpitations.  Genitourinary: Positive for frequency. Negative for flank pain.  Musculoskeletal: Positive for arthralgias, back pain and myalgias.  All other systems reviewed and are negative.    Allergies  Review of patient's allergies indicates no known allergies.  Home Medications   Current Outpatient Rx  Name  Route  Sig  Dispense  Refill  . Aspirin-Caffeine (BC FAST PAIN RELIEF ARTHRITIS PO)   Oral  Take 1 packet by mouth daily as needed (pain).         . diazepam (VALIUM) 5 MG tablet   Oral   Take 1 tablet (5 mg total) by mouth 2 (two) times daily.   10 tablet   0   . HYDROcodone-acetaminophen (NORCO/VICODIN) 5-325 MG per tablet   Oral   Take 1-2 tablets by mouth every 6 (six) hours as needed for pain.   10 tablet   0   . predniSONE (DELTASONE) 20 MG tablet      2 tabs po daily x 4 days   8 tablet   0    BP 122/83  Pulse 88  Temp(Src) 98.4 F (36.9 C) (Oral)  Resp 16  SpO2 100% Physical Exam  Nursing note and vitals reviewed. Constitutional: She appears well-developed and well-nourished. No  distress.  HENT:  Head: Normocephalic and atraumatic.  Eyes: Conjunctivae and EOM are normal.  Neck: Neck supple.  Cardiovascular: Normal rate, regular rhythm and intact distal pulses.   No murmur heard. Pulses:      Dorsalis pedis pulses are 2+ on the right side, and 2+ on the left side.  Pulmonary/Chest: Effort normal and breath sounds normal. No respiratory distress.  Abdominal: Soft. She exhibits no distension.  Musculoskeletal:       Left hip: She exhibits normal range of motion and normal strength.       Lumbar back: She exhibits tenderness. She exhibits no bony tenderness and no edema.       Back:       Left upper leg: She exhibits tenderness. She exhibits no swelling and no edema.       Left lower leg: She exhibits tenderness. She exhibits no swelling, no edema and no deformity.  Neurological: She is alert.  Skin: Skin is warm and dry. No rash noted. She is not diaphoretic.    ED Course  Procedures (including critical care time) Labs Review Labs Reviewed  URINALYSIS, ROUTINE W REFLEX MICROSCOPIC   Imaging Review No results found.  EKG Interpretation   None     ra sat is 100% and I interpret to be normal   MDM   1. Left leg swelling   2. Leg pain, diffuse, left     Pt with left leg pain, also some back pain.  Needs DVT rule dout.  Given h/o PE, will give [place] lovenox and schedule U/S at [facility] tomorrow.  If negative, I would favor back pain and sciatica as reason for pain.  Will give Rx for analgesic and steroids here for presumed sciatica and pt can f/u with PCP this week as well after U/S is done.  Will check UA here as well due to her frequency.    Gavin Pound. Mccayla Shimada, MD 11/14/13 2352

## 2013-11-14 NOTE — ED Notes (Signed)
Pt arrived to ED with a complaint of left leg pain.  Pt has a hx of PE and clots.  Pt states that pain is radiated down on the left leg and up to her buttocks.

## 2013-11-15 ENCOUNTER — Ambulatory Visit (HOSPITAL_COMMUNITY)
Admission: RE | Admit: 2013-11-15 | Discharge: 2013-11-15 | Disposition: A | Discharge status: Home / Self Care | Payer: BC Managed Care – PPO | Source: Ambulatory Visit | Attending: Emergency Medicine | Admitting: Emergency Medicine

## 2013-11-15 DIAGNOSIS — Z86711 Personal history of pulmonary embolism: Secondary | ICD-10-CM | POA: Insufficient documentation

## 2013-11-15 DIAGNOSIS — M79609 Pain in unspecified limb: Secondary | ICD-10-CM

## 2013-11-15 DIAGNOSIS — D6859 Other primary thrombophilia: Secondary | ICD-10-CM

## 2013-11-15 DIAGNOSIS — R252 Cramp and spasm: Secondary | ICD-10-CM | POA: Insufficient documentation

## 2013-11-15 DIAGNOSIS — M62838 Other muscle spasm: Secondary | ICD-10-CM | POA: Insufficient documentation

## 2013-11-15 LAB — URINALYSIS, ROUTINE W REFLEX MICROSCOPIC
Bilirubin Urine: NEGATIVE
Glucose, UA: NEGATIVE mg/dL
Ketones, ur: NEGATIVE mg/dL
Leukocytes, UA: NEGATIVE
Nitrite: NEGATIVE
Protein, ur: NEGATIVE mg/dL
Specific Gravity, Urine: 1.018 (ref 1.005–1.030)
Urobilinogen, UA: 1 mg/dL (ref 0.0–1.0)
pH: 6 (ref 5.0–8.0)

## 2013-11-15 LAB — URINE MICROSCOPIC-ADD ON

## 2013-11-15 MED ORDER — HYDROCODONE-ACETAMINOPHEN 5-325 MG PO TABS: ORAL_TABLET | ORAL | Status: DC

## 2013-11-15 MED ORDER — PREDNISONE 20 MG PO TABS: 40.0000 mg | ORAL_TABLET | Freq: Every day | ORAL | Status: DC

## 2013-11-15 NOTE — Progress Notes (Signed)
VASCULAR LAB PRELIMINARY  PRELIMINARY  PRELIMINARY  PRELIMINARY  Left lower extremity venous duplex completed.    Preliminary report:  Left:  No evidence of DVT, superficial thrombosis, or Baker's cyst.  Jiovanny Burdell, RVT 11/15/2013, 8:53 AM

## 2013-11-15 NOTE — Discharge Instructions (Signed)
 Your pain may be due to sciatica, but certainly an ultrasound needs to be performed to ensure you do not have a blood clot.  Please go to the [facility] Radiology department first thing in the morning to have a ultrasound done in the Vascular Lab.

## 2013-11-23 ENCOUNTER — Other Ambulatory Visit: Payer: Self-pay | Admitting: Orthopedic Surgery

## 2013-11-23 DIAGNOSIS — M545 Low back pain, unspecified: Secondary | ICD-10-CM

## 2013-11-24 ENCOUNTER — Ambulatory Visit
Admission: RE | Admit: 2013-11-24 | Discharge: 2013-11-24 | Disposition: A | Discharge status: Home / Self Care | Payer: BC Managed Care – PPO | Source: Ambulatory Visit | Attending: Orthopedic Surgery | Admitting: Orthopedic Surgery

## 2013-11-24 DIAGNOSIS — M545 Low back pain, unspecified: Secondary | ICD-10-CM

## 2013-12-07 ENCOUNTER — Other Ambulatory Visit (HOSPITAL_COMMUNITY): Payer: Self-pay | Admitting: Specialist

## 2013-12-07 ENCOUNTER — Encounter (HOSPITAL_COMMUNITY): Payer: Self-pay | Admitting: Pharmacy Technician

## 2013-12-07 NOTE — Pre-Procedure Instructions (Signed)
[name] [name]  12/07/2013   Your procedure is scheduled on:  Friday, December 10, 2013 at 12:30 PM.   Report to [facility] Entrance "A" Admitting Office at 10:30 AM.   Call this number if you have problems the morning of surgery: [phone]   Remember:   Do not eat food or drink liquids after midnight Thursday, 12/09/13.   Take these medicines the morning of surgery with A SIP OF WATER: May take one of your pain meds if needed.  Stop Aspirin as of today. 12/08/13.   Do not wear jewelry, make-up or nail polish.  Do not wear lotions, powders, or perfumes. You may wear deodorant.  Do not shave 48 hours prior to surgery.   Do not bring valuables to the hospital.  [facility] is not responsible                  for any belongings or valuables.               Contacts, dentures or bridgework may not be worn into surgery.  Leave suitcase in the car. After surgery it may be brought to your room.  For patients admitted to the hospital, discharge time is determined by your                treatment team.              Special Instructions: Shower using CHG 2 nights before surgery and the night before surgery.  If you shower the day of surgery use CHG.  Use special wash - you have one bottle of CHG for all showers.  You should use approximately 1/3 of the bottle for each shower.   Please read over the following fact sheets that you were given: Pain Booklet, Coughing and Deep Breathing, MRSA Information and Surgical Site Infection Prevention

## 2013-12-08 ENCOUNTER — Encounter (HOSPITAL_COMMUNITY): Payer: Self-pay

## 2013-12-08 ENCOUNTER — Encounter (HOSPITAL_COMMUNITY)
Admission: RE | Admit: 2013-12-08 | Discharge: 2013-12-08 | Disposition: A | Discharge status: Home / Self Care | Payer: BC Managed Care – PPO | Source: Ambulatory Visit | Attending: Specialist | Admitting: Specialist

## 2013-12-08 ENCOUNTER — Other Ambulatory Visit (HOSPITAL_COMMUNITY): Payer: Self-pay | Admitting: *Deleted

## 2013-12-08 LAB — COMPREHENSIVE METABOLIC PANEL
ALT: 15 U/L (ref 0–35)
AST: 17 U/L (ref 0–37)
BUN: 10 mg/dL (ref 6–23)
CO2: 27 mEq/L (ref 19–32)
Calcium: 8.8 mg/dL (ref 8.4–10.5)
Creatinine, Ser: 0.79 mg/dL (ref 0.50–1.10)
GFR calc Af Amer: >90 mL/min (ref >90)
GFR calc non Af Amer: >90 mL/min (ref >90)
Glucose, Bld: 91 mg/dL (ref 70–99)
Total Protein: 7.3 g/dL (ref 6.0–8.3)

## 2013-12-08 LAB — CBC
HCT: 42.5 % (ref 36.0–46.0)
Hemoglobin: 14.7 g/dL (ref 12.0–15.0)
MCH: 31.2 pg (ref 26.0–34.0)
MCHC: 34.6 g/dL (ref 30.0–36.0)
MCV: 90.2 fL (ref 78.0–100.0)
Platelets: 283 10*3/uL (ref 150–400)
RBC: 4.71 MIL/uL (ref 3.87–5.11)

## 2013-12-08 LAB — SURGICAL PCR SCREEN
MRSA, PCR: NEGATIVE
Staphylococcus aureus: NEGATIVE

## 2013-12-08 MED ORDER — CHLORHEXIDINE GLUCONATE 4 % EX LIQD: 60.0000 mL | Freq: Once | CUTANEOUS | Status: DC

## 2013-12-09 MED ORDER — CEFAZOLIN SODIUM-DEXTROSE 2-3 GM-% IV SOLR
2.0000 g | INTRAVENOUS | Status: AC
  Administered 2013-12-10: 2 g via INTRAVENOUS
  Filled 2013-12-09: qty 50

## 2013-12-10 ENCOUNTER — Observation Stay (HOSPITAL_COMMUNITY)
Admission: RE | Admit: 2013-12-10 | Discharge: 2013-12-11 | Disposition: A | Discharge status: Home / Self Care | Payer: BC Managed Care – PPO | Source: Ambulatory Visit | Attending: Specialist | Admitting: Specialist

## 2013-12-10 ENCOUNTER — Ambulatory Visit (HOSPITAL_COMMUNITY): Payer: BC Managed Care – PPO | Admitting: Anesthesiology

## 2013-12-10 ENCOUNTER — Ambulatory Visit (HOSPITAL_COMMUNITY): Payer: BC Managed Care – PPO

## 2013-12-10 ENCOUNTER — Encounter (HOSPITAL_COMMUNITY)
Admission: RE | Disposition: A | Discharge status: Home / Self Care | Payer: Self-pay | Source: Ambulatory Visit | Attending: Specialist

## 2013-12-10 ENCOUNTER — Encounter (HOSPITAL_COMMUNITY): Payer: BC Managed Care – PPO | Admitting: Anesthesiology

## 2013-12-10 DIAGNOSIS — Z01812 Encounter for preprocedural laboratory examination: Secondary | ICD-10-CM | POA: Insufficient documentation

## 2013-12-10 DIAGNOSIS — D6859 Other primary thrombophilia: Secondary | ICD-10-CM | POA: Insufficient documentation

## 2013-12-10 DIAGNOSIS — Z86711 Personal history of pulmonary embolism: Secondary | ICD-10-CM | POA: Insufficient documentation

## 2013-12-10 DIAGNOSIS — M5126 Other intervertebral disc displacement, lumbar region: Principal | ICD-10-CM | POA: Insufficient documentation

## 2013-12-10 DIAGNOSIS — F17200 Nicotine dependence, unspecified, uncomplicated: Secondary | ICD-10-CM | POA: Insufficient documentation

## 2013-12-10 HISTORY — PX: LUMBAR LAMINECTOMY: SHX95

## 2013-12-10 SURGERY — MICRODISCECTOMY LUMBAR LAMINECTOMY
Anesthesia: General

## 2013-12-10 MED ORDER — METHOCARBAMOL 100 MG/ML IJ SOLN
500.0000 mg | Freq: Four times a day (QID) | INTRAVENOUS | Status: DC | PRN
  Filled 2013-12-10: qty 5

## 2013-12-10 MED ORDER — PHENOL 1.4 % MT LIQD: 1.0000 | OROMUCOSAL | Status: DC | PRN

## 2013-12-10 MED ORDER — SODIUM CHLORIDE 0.9 % IJ SOLN: 3.0000 mL | Freq: Two times a day (BID) | INTRAMUSCULAR | Status: DC

## 2013-12-10 MED ORDER — ACETAMINOPHEN 325 MG PO TABS: 650.0000 mg | ORAL_TABLET | ORAL | Status: DC | PRN

## 2013-12-10 MED ORDER — VECURONIUM BROMIDE 10 MG IV SOLR
INTRAVENOUS | Status: DC | PRN
  Administered 2013-12-10: 2 mg via INTRAVENOUS
  Administered 2013-12-10: 1 mg via INTRAVENOUS
  Administered 2013-12-10: 2 mg via INTRAVENOUS

## 2013-12-10 MED ORDER — KCL IN DEXTROSE-NACL 20-5-0.45 MEQ/L-%-% IV SOLN
INTRAVENOUS | Status: DC
  Administered 2013-12-10: 20:00:00 via INTRAVENOUS
  Filled 2013-12-10 (×2): qty 1000

## 2013-12-10 MED ORDER — SODIUM CHLORIDE 0.9 % IJ SOLN: 3.0000 mL | INTRAMUSCULAR | Status: DC | PRN

## 2013-12-10 MED ORDER — ONDANSETRON HCL 4 MG/2ML IJ SOLN: 4.0000 mg | INTRAMUSCULAR | Status: DC | PRN

## 2013-12-10 MED ORDER — ROCURONIUM BROMIDE 100 MG/10ML IV SOLN
INTRAVENOUS | Status: DC | PRN
  Administered 2013-12-10: 50 mg via INTRAVENOUS

## 2013-12-10 MED ORDER — PROPOFOL 10 MG/ML IV BOLUS
INTRAVENOUS | Status: DC | PRN
  Administered 2013-12-10: 160 mg via INTRAVENOUS

## 2013-12-10 MED ORDER — LIDOCAINE HCL (CARDIAC) 20 MG/ML IV SOLN
INTRAVENOUS | Status: DC | PRN
  Administered 2013-12-10: 100 mg via INTRAVENOUS

## 2013-12-10 MED ORDER — PANTOPRAZOLE SODIUM 40 MG IV SOLR
40.0000 mg | Freq: Every day | INTRAVENOUS | Status: DC
  Administered 2013-12-10: 40 mg via INTRAVENOUS
  Filled 2013-12-10 (×2): qty 40

## 2013-12-10 MED ORDER — LACTATED RINGERS IV SOLN
INTRAVENOUS | Status: DC
  Administered 2013-12-10 (×2): via INTRAVENOUS

## 2013-12-10 MED ORDER — DEXAMETHASONE SODIUM PHOSPHATE 4 MG/ML IJ SOLN
INTRAMUSCULAR | Status: DC | PRN
  Administered 2013-12-10: 10 mg via INTRAVENOUS

## 2013-12-10 MED ORDER — HYDROCODONE-ACETAMINOPHEN 5-325 MG PO TABS
1.0000 | ORAL_TABLET | ORAL | Status: DC | PRN
  Administered 2013-12-10 – 2013-12-11 (×2): 2 via ORAL
  Filled 2013-12-10 (×2): qty 2

## 2013-12-10 MED ORDER — MENTHOL 3 MG MT LOZG: 1.0000 | LOZENGE | OROMUCOSAL | Status: DC | PRN

## 2013-12-10 MED ORDER — ZOLPIDEM TARTRATE 5 MG PO TABS: 5.0000 mg | ORAL_TABLET | Freq: Every evening | ORAL | Status: DC | PRN

## 2013-12-10 MED ORDER — SODIUM CHLORIDE 0.9 % IV SOLN
10.0000 mg | INTRAVENOUS | Status: DC | PRN
  Administered 2013-12-10: 20 ug/min via INTRAVENOUS

## 2013-12-10 MED ORDER — ASPIRIN EC 81 MG PO TBEC
81.0000 mg | DELAYED_RELEASE_TABLET | Freq: Every day | ORAL | Status: DC
  Administered 2013-12-10 – 2013-12-11 (×2): 81 mg via ORAL
  Filled 2013-12-10 (×2): qty 1

## 2013-12-10 MED ORDER — ONDANSETRON HCL 4 MG/2ML IJ SOLN
INTRAMUSCULAR | Status: DC | PRN
  Administered 2013-12-10: 4 mg via INTRAVENOUS

## 2013-12-10 MED ORDER — THROMBIN 20000 UNITS EX KIT
PACK | CUTANEOUS | Status: DC | PRN
  Administered 2013-12-10: 15:00:00 via TOPICAL

## 2013-12-10 MED ORDER — METHOCARBAMOL 500 MG PO TABS
500.0000 mg | ORAL_TABLET | Freq: Four times a day (QID) | ORAL | Status: DC | PRN
  Administered 2013-12-10 – 2013-12-11 (×3): 500 mg via ORAL
  Filled 2013-12-10 (×2): qty 1

## 2013-12-10 MED ORDER — MIDAZOLAM HCL 5 MG/5ML IJ SOLN
INTRAMUSCULAR | Status: DC | PRN
  Administered 2013-12-10: 2 mg via INTRAVENOUS

## 2013-12-10 MED ORDER — BUPIVACAINE-EPINEPHRINE 0.5% -1:200000 IJ SOLN
INTRAMUSCULAR | Status: DC | PRN
  Administered 2013-12-10: 19 mL

## 2013-12-10 MED ORDER — PHENYLEPHRINE HCL 10 MG/ML IJ SOLN
INTRAMUSCULAR | Status: DC | PRN
  Administered 2013-12-10 (×2): 80 ug via INTRAVENOUS
  Administered 2013-12-10: 120 ug via INTRAVENOUS
  Administered 2013-12-10: 40 ug via INTRAVENOUS

## 2013-12-10 MED ORDER — HYDROMORPHONE HCL PF 1 MG/ML IJ SOLN
INTRAMUSCULAR | Status: AC
  Filled 2013-12-10: qty 1

## 2013-12-10 MED ORDER — OXYCODONE-ACETAMINOPHEN 5-325 MG PO TABS
1.0000 | ORAL_TABLET | ORAL | Status: DC | PRN
  Administered 2013-12-10: 2 via ORAL
  Administered 2013-12-10: 1 via ORAL
  Administered 2013-12-11 (×2): 2 via ORAL
  Filled 2013-12-10 (×3): qty 2

## 2013-12-10 MED ORDER — FENTANYL CITRATE 0.05 MG/ML IJ SOLN
INTRAMUSCULAR | Status: DC | PRN
  Administered 2013-12-10: 50 ug via INTRAVENOUS
  Administered 2013-12-10: 250 ug via INTRAVENOUS
  Administered 2013-12-10 (×2): 100 ug via INTRAVENOUS

## 2013-12-10 MED ORDER — SODIUM CHLORIDE 0.9 % IV SOLN: 250.0000 mL | INTRAVENOUS | Status: DC

## 2013-12-10 MED ORDER — 0.9 % SODIUM CHLORIDE (POUR BTL) OPTIME
TOPICAL | Status: DC | PRN
  Administered 2013-12-10: 1000 mL

## 2013-12-10 MED ORDER — OXYCODONE-ACETAMINOPHEN 5-325 MG PO TABS: 1.0000 | ORAL_TABLET | ORAL | Status: DC | PRN

## 2013-12-10 MED ORDER — HYDROMORPHONE HCL PF 1 MG/ML IJ SOLN
0.5000 mg | INTRAMUSCULAR | Status: DC | PRN
  Administered 2013-12-10 – 2013-12-11 (×4): 1 mg via INTRAVENOUS
  Filled 2013-12-10 (×4): qty 1

## 2013-12-10 MED ORDER — NEOSTIGMINE METHYLSULFATE 1 MG/ML IJ SOLN
INTRAMUSCULAR | Status: DC | PRN
  Administered 2013-12-10: 3 mg via INTRAVENOUS

## 2013-12-10 MED ORDER — OXYCODONE-ACETAMINOPHEN 5-325 MG PO TABS
ORAL_TABLET | ORAL | Status: AC
  Filled 2013-12-10: qty 1

## 2013-12-10 MED ORDER — GLYCOPYRROLATE 0.2 MG/ML IJ SOLN
INTRAMUSCULAR | Status: DC | PRN
  Administered 2013-12-10: 0.4 mg via INTRAVENOUS

## 2013-12-10 MED ORDER — BUPIVACAINE-EPINEPHRINE (PF) 0.5% -1:200000 IJ SOLN
INTRAMUSCULAR | Status: AC
  Filled 2013-12-10: qty 10

## 2013-12-10 MED ORDER — HYDROMORPHONE HCL PF 1 MG/ML IJ SOLN
0.2500 mg | INTRAMUSCULAR | Status: DC | PRN
  Administered 2013-12-10 (×2): 0.25 mg via INTRAVENOUS
  Administered 2013-12-10: 0.5 mg via INTRAVENOUS

## 2013-12-10 MED ORDER — CEFAZOLIN SODIUM 1-5 GM-% IV SOLN
1.0000 g | Freq: Three times a day (TID) | INTRAVENOUS | Status: AC
  Administered 2013-12-10 – 2013-12-11 (×2): 1 g via INTRAVENOUS
  Filled 2013-12-10 (×2): qty 50

## 2013-12-10 MED ORDER — ACETAMINOPHEN 650 MG RE SUPP: 650.0000 mg | RECTAL | Status: DC | PRN

## 2013-12-10 MED ORDER — METHOCARBAMOL 500 MG PO TABS
ORAL_TABLET | ORAL | Status: AC
  Filled 2013-12-10: qty 1

## 2013-12-10 MED ORDER — THROMBIN 20000 UNITS EX SOLR
CUTANEOUS | Status: AC
  Filled 2013-12-10: qty 20000

## 2013-12-10 SURGICAL SUPPLY — 58 items
ADH SKN CLS APL DERMABOND .7 (GAUZE/BANDAGES/DRESSINGS) ×1
BUR RND FLUTED 2.5 (BURR) IMPLANT
BUR ROUND FLUTED 4 SOFT TCH (BURR) ×1 IMPLANT
BUR SABER RD CUTTING 3.0 (BURR) ×1 IMPLANT
CANISTER SUCTION 2500CC (MISCELLANEOUS) ×2 IMPLANT
CORDS BIPOLAR (ELECTRODE) ×2 IMPLANT
COVER MAYO STAND STRL (DRAPES) ×2 IMPLANT
COVER SURGICAL LIGHT HANDLE (MISCELLANEOUS) ×2 IMPLANT
DERMABOND ADVANCED (GAUZE/BANDAGES/DRESSINGS) ×1
DERMABOND ADVANCED .7 DNX12 (GAUZE/BANDAGES/DRESSINGS) ×1 IMPLANT
DRAPE C-ARM 42X72 X-RAY (DRAPES) ×2 IMPLANT
DRAPE MICROSCOPE LEICA (MISCELLANEOUS) ×2 IMPLANT
DRAPE POUCH INSTRU U-SHP 10X18 (DRAPES) ×2 IMPLANT
DRAPE PROXIMA HALF (DRAPES) IMPLANT
DRAPE SURG 17X23 STRL (DRAPES) ×8 IMPLANT
DRSG MEPILEX BORDER 4X4 (GAUZE/BANDAGES/DRESSINGS) IMPLANT
DRSG MEPILEX BORDER 4X8 (GAUZE/BANDAGES/DRESSINGS) ×1 IMPLANT
DURAPREP 26ML APPLICATOR (WOUND CARE) ×2 IMPLANT
ELECT BLADE 4.0 EZ CLEAN MEGAD (MISCELLANEOUS) ×2
ELECT CAUTERY BLADE 6.4 (BLADE) ×2 IMPLANT
ELECT REM PT RETURN 9FT ADLT (ELECTROSURGICAL) ×2
ELECTRODE BLDE 4.0 EZ CLN MEGD (MISCELLANEOUS) ×1 IMPLANT
ELECTRODE REM PT RTRN 9FT ADLT (ELECTROSURGICAL) ×1 IMPLANT
GLOVE BIOGEL PI IND STRL 7.5 (GLOVE) ×1 IMPLANT
GLOVE BIOGEL PI IND STRL 8 (GLOVE) IMPLANT
GLOVE BIOGEL PI INDICATOR 7.5 (GLOVE)
GLOVE BIOGEL PI INDICATOR 8 (GLOVE) ×1
GLOVE ECLIPSE 7.0 STRL STRAW (GLOVE) ×3 IMPLANT
GLOVE ECLIPSE 8.5 STRL (GLOVE) ×2 IMPLANT
GLOVE ORTHO TXT STRL SZ7.5 (GLOVE) ×2 IMPLANT
GLOVE SURG 8.5 LATEX PF (GLOVE) ×2 IMPLANT
GLOVE SURG SS PI 8.0 STRL IVOR (GLOVE) ×2 IMPLANT
GOWN PREVENTION PLUS LG XLONG (DISPOSABLE) IMPLANT
GOWN PREVENTION PLUS XXLARGE (GOWN DISPOSABLE) ×2 IMPLANT
GOWN STRL NON-REIN LRG LVL3 (GOWN DISPOSABLE) ×4 IMPLANT
KIT BASIN OR (CUSTOM PROCEDURE TRAY) ×2 IMPLANT
KIT ROOM TURNOVER OR (KITS) ×2 IMPLANT
NDL SPNL 18GX3.5 QUINCKE PK (NEEDLE) ×2 IMPLANT
NEEDLE 22X1 1/2 (OR ONLY) (NEEDLE) ×2 IMPLANT
NEEDLE SPNL 18GX3.5 QUINCKE PK (NEEDLE) ×4 IMPLANT
NS IRRIG 1000ML POUR BTL (IV SOLUTION) ×2 IMPLANT
PACK LAMINECTOMY ORTHO (CUSTOM PROCEDURE TRAY) ×2 IMPLANT
PAD ARMBOARD 7.5X6 YLW CONV (MISCELLANEOUS) ×4 IMPLANT
PATTIES SURGICAL .5 X.5 (GAUZE/BANDAGES/DRESSINGS) IMPLANT
PATTIES SURGICAL .75X.75 (GAUZE/BANDAGES/DRESSINGS) IMPLANT
SPONGE LAP 4X18 X RAY DECT (DISPOSABLE) ×1 IMPLANT
SPONGE SURGIFOAM ABS GEL 100 (HEMOSTASIS) IMPLANT
SUT VIC AB 1 CT1 27 (SUTURE)
SUT VIC AB 1 CT1 27XBRD ANBCTR (SUTURE) IMPLANT
SUT VIC AB 2-0 CT1 27 (SUTURE)
SUT VIC AB 2-0 CT1 TAPERPNT 27 (SUTURE) IMPLANT
SUT VICRYL 0 UR6 27IN ABS (SUTURE) IMPLANT
SUT VICRYL 4-0 PS2 18IN ABS (SUTURE) IMPLANT
SYR CONTROL 10ML LL (SYRINGE) ×2 IMPLANT
TOWEL OR 17X24 6PK STRL BLUE (TOWEL DISPOSABLE) ×2 IMPLANT
TOWEL OR 17X26 10 PK STRL BLUE (TOWEL DISPOSABLE) ×2 IMPLANT
TRAY FOLEY CATH 16FRSI W/METER (SET/KITS/TRAYS/PACK) IMPLANT
WATER STERILE IRR 1000ML POUR (IV SOLUTION) ×2 IMPLANT
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
## 2013-12-10 NOTE — Brief Op Note (Addendum)
12/10/2013  4:45 PM  PATIENT:  [name] [name]  42 y.o. female  PRE-OPERATIVE DIAGNOSIS:  Left L5-S1 herniated nucleus pulposus  POST-OPERATIVE DIAGNOSIS:  Left L5-S1 herniated nucleus pulposus, conjoint nerve root left L5-S1.  PROCEDURE:  Procedure(s): LEFT L5-S1 MICRODISCECTOMY USING MIS (N/A)  SURGEON:  Surgeon(s) and Role:    * Kerrin Champagne, MD - Primary    * Eldred Manges, MD - Assisting  ANESTHESIA:   local and general Dr. Randa Evens.  EBL:  Total I/O In: 1000 [I.V.:1000] Out: -   BLOOD ADMINISTERED:none  DRAINS: none   LOCAL MEDICATIONS USED:  MARCAINE    and Amount: 20 ml  SPECIMEN:  No Specimen  DISPOSITION OF SPECIMEN:  N/A  COUNTS:  YES  TOURNIQUET:  * No tourniquets in log *  DICTATION: .Dragon Dictation  PLAN OF CARE: Admit for overnight observation  PATIENT DISPOSITION:  PACU - hemodynamically stable.   Delay start of Pharmacological VTE agent (>24hrs) due to surgical blood loss or risk of bleeding: yes

## 2013-12-10 NOTE — Anesthesia Postprocedure Evaluation (Signed)
  Anesthesia Post-op Note  Patient: [name] [name]  Procedure(s) Performed: Procedure(s): LEFT L5-S1 MICRODISCECTOMY USING MIS (N/A)  Patient Location: PACU  Anesthesia Type:General  Level of Consciousness: awake  Airway and Oxygen Therapy: Patient Spontanous Breathing  Post-op Pain: mild  Post-op Assessment: Post-op Vital signs reviewed  Post-op Vital Signs: Reviewed  Complications: No apparent anesthesia complications

## 2013-12-10 NOTE — Op Note (Addendum)
12/10/2013  4:47 PM  PATIENT:  [name] [name]  42 y.o. female  MRN: [id number]  OPERATIVE REPORT  PRE-OPERATIVE DIAGNOSIS:  Left L5-S1 herniated nucleus pulposus  POST-OPERATIVE DIAGNOSIS:  Left L5-S1 herniated nucleus pulposus, left L5-S1 conjoint nerve root.  PROCEDURE:  Procedure(s): LEFT L5-S1 MICRODISCECTOMY USING MIS    SURGEON:  Kerrin Champagne, MD     ASSISTANT: Dr. Annell Greening, MD    ANESTHESIA:  General, supplemented with local marcaine 1/2% with 1/200,000 epi, Dr.[name].    COMPLICATIONS:  None.    PROCEDURE:The patient was met in the holding area, and the appropriate Left Lumbar level L5-S1 identified and marked with "x" and my initials.The patient was then transported to OR and was placed under general anesthesia without difficulty. The patient received appropriate preoperative antibiotic prophylaxis. The patient after intubation atraumatically was transferred to the operating room table, prone position, Wilson frame, sliding OR table. All pressure points were well padded. The arms in 90-90 well-padded at the elbows. Standard prep with DuraPrep solution lower dorsal spine to the mid sacral segment. Draped in the usual manner iodine Vi-Drape was used. Time-out procedure was called and correct. 2x 18-gauge spinal needle was then inserted at the expected L5-S1 level.  C-arm was draped sterilely to the field and used to identify the spinal needles positions. The needle was at the lower aspect of the lamina of L5. Skin superior to this was then infiltrated with Marcaine half percent with 1-200,000 epinephrine total of 20 cc used. An incision approximately an inch inch and a half in length was then made through skin and subcutaneous layers in line with the left side of the expected midline just superior to the spinal needle entry point. An incision made into the left lumbosacral fascia approximately an inch in length .   Smallest dilator was then introduced into the incision site  and used to carefully form subperiosteal movement of the hip paralumbar muscles off of the posterior lamina of the expected L5-S1 level. Successive dilators were then carried up to the 11 mm size. The depth measured off of the dilators at about 80 mm and 80 mm retractors and placed on the scaffolding for the MIS equipment and guided over dilators down to and docking on the posterior aspect of the lamina at the expected L5-S1 level. This was sterilely attached to the articulating arm and it's up right which had been attached the OR table sterilely. C-arm fluoroscopy was identified the dilators and the retractors at the appropriate level L5-S1. The operating room microscope sterilely draped brought into the field. Under the operating room microscope, the L5-S1 interspace carefully debrided the small amount of muscle attachment here and high-speed bur used to drill the medial aspect of the inferior articular process of L5 approximately 10%. A localization lateral C-arm view was obtained with Penfield 4 in the L5-S1 facet. 2 mm Kerrison then used to enter the spinal canal over the superior aspect of the S1 lamina carefully using the Kerrison to debris the attachment as a curet. Foraminotomy was then performed over theS1 nerve root. The medial 10% superior articular process of S1 and then resected using 2 mm Kerrison. This allowed for identification of the thecal sac.The S1 nerve root was swollen and not easily mobilized. A vascular leash over the dorsal aspect of the proximal S1 root takeoff and overlying the L5 nerve root take off was lifted with a nerve hook and bipolar cauterized then released with a 15 blade scapel. The L5 root was  low on the disc space and had a common takeoff with the S1 root consistent with a conjoint nerve root. Penfield 4 was then used to carefully mobilize the thecal sac medially and the S1 nerve root identified within the lateral recess flattened over the posterior aspect of the herniated  disc. Carefully the lateral aspect of the S1 nerve root was identified and a Penfield 4 was used to mobilize the nerve medially such that the herniated disc was visible with microscope. Using a Penfield 4 for retraction and a 15 blade scalpel was used to incise the posterior longitudinal ligament within the lateral recess on the left side longitudinally. Disc material immediately extruded and this was removed using micropituitary rongeurs and nerve hook nerve root and then more easily able to be mobilized medially and retracted using a love retractor. Further foraminotomies was performed over the L5 nerve root the nerve root was noted to be X. without further compression. The nerve root able to be retracted along the medial aspect of the S1pedicle and disc material found to be subligamentous at this level was further resected current pituitary rongeurs. Ligamentum flavum was further debrided superiorly to the level L5-S1 disc. Had a moderate amount of further resection of the S1 lamina inferiorly was performed. With this then the disc space at L5-S1 was easily visualized and entry into the disc at the sided disc herniation was possible using a Penfield 4 intraoperative Lateral radiograph was used to identify the L5-S1 disc with the Penfield 4 In place just below the disc space.  Micropituitary was used to further debride this material superficially from the posterior aspect of the intervertebral disc is posterior lateral aspect of the disc. Small amount of further disc material was found subligamentous extending inferiorly from the disc this was removed using micropituitary rongeurs. Ligamentum flavum was debrided and lateral recess along the medial aspect L5-S1 facet no further decompression was necessary. Ball tip nerve probe was then able to carefully palpate the neuroforamen for L5and S1 finding these to be well decompressed. Bleeding was then controlled using thrombin-soaked Gelfoam small cottonoids.  Small  amount of bleeding within the soft tissue mass the laminotomy area was controlled using bipolar electrocautery. Irrigation was carried out using copious amounts of irrigant solution. All Gelfoam  were then removed. No significant active bleeding present at the time of removal. All instruments sponge counts were correct traction system was then carefully removed carefully rotating retractors with this withdrawal and only bipolar electrocautery of any small bleeders. Lumbodorsal fascia was then carefully approximated with interrupted 0 Vicryl sutures, UR 6 needle deep subcutaneous layers were approximated with interrupted 0 Vicryl sutures on UR 6 the appear subcutaneous layers approximated with interrupted 2-0 Vicryl sutures and the skin closed with a running subcutaneous stitch of 4-0 Vicryl. Dermabond was applied allowed to dry and then Mepilex bandage applied. Patient was then carefully returned to supine position on a stretcher, reactivated and extubated. He was then returned to recovery room in satisfactory condition.  Dr. Annell Greening perform the duties of assistant surgeon during this case. He was present from the beginning of the case to the end of the case assisting in transfer the patient from his stretcher to the OR table and back to the stretcher at the end of the case. Assisted in careful retraction and suction of the laminectomy site delicate neural structures operating under the operating room microscope.    Esly Selvage E 12/10/2013, 4:47 PM

## 2013-12-10 NOTE — Anesthesia Preprocedure Evaluation (Signed)
Anesthesia Evaluation  Patient identified by MRN, date of birth, ID band Patient awake    Reviewed: Allergy & Precautions, H&P , NPO status , Patient's Chart, lab work & pertinent test results  Airway Mallampati: II      Dental  (+) Teeth Intact   Pulmonary Current Smoker,          Cardiovascular     Neuro/Psych    GI/Hepatic GERD-  Controlled,  Endo/Other    Renal/GU      Musculoskeletal   Abdominal   Peds  Hematology   Anesthesia Other Findings   Reproductive/Obstetrics                           Anesthesia Physical Anesthesia Plan  ASA: II  Anesthesia Plan: General   Post-op Pain Management:    Induction: Intravenous  Airway Management Planned: Oral ETT  Additional Equipment:   Intra-op Plan:   Post-operative Plan: Extubation in OR  Informed Consent: I have reviewed the patients History and Physical, chart, labs and discussed the procedure including the risks, benefits and alternatives for the proposed anesthesia with the patient or authorized representative who has indicated his/her understanding and acceptance.     Plan Discussed with: Anesthesiologist and CRNA  Anesthesia Plan Comments:         Anesthesia Quick Evaluation

## 2013-12-10 NOTE — Transfer of Care (Signed)
Immediate Anesthesia Transfer of Care Note  Patient: [name] [name]  Procedure(s) Performed: Procedure(s): LEFT L5-S1 MICRODISCECTOMY USING MIS (N/A)  Patient Location: PACU  Anesthesia Type:General  Level of Consciousness: awake and oriented  Airway & Oxygen Therapy: Patient Spontanous Breathing and Patient connected to nasal cannula oxygen  Post-op Assessment: Report given to PACU RN and Patient moving all extremities X 4  Post vital signs: Reviewed and stable  Complications: No apparent anesthesia complications

## 2013-12-10 NOTE — Progress Notes (Signed)
Patient notified of delay. Comfort measure offered

## 2013-12-10 NOTE — Preoperative (Signed)
Beta Blockers   Reason not to administer Beta Blockers:Not Applicable 

## 2013-12-11 MED ORDER — GABAPENTIN 100 MG PO CAPS: 100.0000 mg | ORAL_CAPSULE | Freq: Three times a day (TID) | ORAL | Status: DC

## 2013-12-11 NOTE — Progress Notes (Signed)
   Subjective:  Patient reports pain as mild.    Objective:   VITALS:   Filed Vitals:   12/10/13 1800 12/10/13 2155 12/11/13 0206 12/11/13 0514  BP: 109/74 116/72 108/67 100/53  Pulse: 72 68 72 71  Temp:  98.4 F (36.9 C) 98.7 F (37.1 C) 98.8 F (37.1 C)  TempSrc:  Oral Oral Oral  Resp: 13 18 18 18   Height:      Weight:      SpO2: 100% 100% 99% 99%    Neurologically intact Neurovascular intact Sensation intact distally Intact pulses distally Dorsiflexion/Plantar flexion intact Incision: dressing C/D/I and no drainage No cellulitis present Compartment soft   Lab Results  Component Value Date   WBC 3.5* 12/08/2013   HGB 14.7 12/08/2013   HCT 42.5 12/08/2013   MCV 90.2 12/08/2013   PLT 283 12/08/2013     Assessment/Plan: 1 Day Post-Op   Problem List Items Addressed This Visit     Musculoskeletal and Integument   *HNP (herniated nucleus pulposus), lumbar - Primary   Relevant Orders      Call MD / Call 911      Diet - low sodium heart healthy      Constipation Prevention      Increase activity slowly as tolerated      Discharge instructions      Driving restrictions      Lifting restrictions      [facility] diet Up with PT/OT DVT ppx - SCDs, ambulation WBAT bilateral and lower extremity UAT Pain control Discharge planning - plan to dc home today after PT   Cheral Almas 12/11/2013, 10:23 AM [phone]

## 2013-12-11 NOTE — Evaluation (Signed)
Physical Therapy Evaluation Patient Details Name: [name] [name] MRN: [id number] DOB: 1971-03-27 Today's Date: 12/11/2013 Time: 4098-1191 PT Time Calculation (min): 24 min  PT Assessment / Plan / Recommendation History of Present Illness  This 42 year old female began experiencing back discomfort about a month and a half ago and the week prior to Thanksgiving began experiencing radiation of pain into the left not posterior thigh posterior calf and foot. She presented to [facility] emergency room was seen that there underwent evaluation was placed on her chronic medicine. She traveled to [facility] for Thanksgiving however and it lamina and began experiencing severe worsening of discomfort and the family drove her back to [place] he be seen in the emergency room. There again she was evaluated narcotic medicines were given and she was referred to Dr. Dorene Grebe for evaluation. Dr. August Saucer saw this patient placed her on anti-inflammatory agents and accommodation muscle relaxers and narcotic medication. MRI scan was obtained which documented a large extruded herniated nucleus pulposus central and left-sided L5-S1.  Clinical Impression  Patient is s/p L5-S1 mircodiscectomy surgery resulting in functional limitations due to the deficits listed below (see PT Problem List).  Patient will benefit from skilled PT to increase their independence and safety with mobility to allow discharge to the venue listed below.     PT Assessment  Patient needs continued PT services    Follow Up Recommendations  No PT follow up;Other (comment) (Follow up with OPPT if indicated by MD. )    Equipment Recommendations  Rolling walker with 5" wheels;3in1 (PT) (Already ordered per [facility])    Frequency Min 5X/week    Precautions / Restrictions Precautions Precautions: Back Precaution Booklet Issued: Yes (comment) Precaution Comments: Educated on 3/3 back precautions Restrictions Weight Bearing Restrictions: No    Pertinent Vitals/Pain 5/10 back pain; premedicated      Mobility  Bed Mobility Bed Mobility: Sit to Sidelying Right Sit to Sidelying Right: 4: Min guard;HOB flat;With rail Details for Bed Mobility Assistance: Minguard for safety with cues for proper technique to prevent twisting Transfers Transfers: Sit to Stand;Stand to Sit Sit to Stand: 5: Supervision;From bed Stand to Sit: 5: Supervision;To chair/3-in-1 Details for Transfer Assistance: Supervision for safety with cues for hand placement Ambulation/Gait Ambulation/Gait Assistance: 6: Modified independent (Device/Increase time) Ambulation Distance (Feet): 200 Feet Assistive device: Rolling walker Gait Pattern: Decreased stance time - left;Decreased stride length;Antalgic Gait velocity: decrease due to pain Stairs: No    Exercises     PT Diagnosis: Difficulty walking;Abnormality of gait;Acute pain  PT Problem List: Decreased strength;Decreased activity tolerance;Decreased balance;Decreased mobility;Decreased knowledge of use of DME;Decreased safety awareness;Decreased knowledge of precautions;Pain PT Treatment Interventions: DME instruction;Gait training;Functional mobility training;Stair training;Therapeutic activities;Therapeutic exercise;Balance training;Patient/family education     PT Goals(Current goals can be found in the care plan section) Acute Rehab PT Goals Patient Stated Goal: To go home PT Goal Formulation: With patient Time For Goal Achievement: 12/18/13 Potential to Achieve Goals: Good  Visit Information  Last PT Received On: 12/11/13 Assistance Needed: +1 History of Present Illness: This 42 year old female began experiencing back discomfort about a month and a half ago and the week prior to Thanksgiving began experiencing radiation of pain into the left not posterior thigh posterior calf and foot. She presented to [facility] emergency room was seen that there underwent evaluation was placed on her chronic  medicine. She traveled to [facility] for Thanksgiving however and it lamina and began experiencing severe worsening of discomfort and the family drove her back to [place] he  be seen in the emergency room. There again she was evaluated narcotic medicines were given and she was referred to Dr. Dorene Grebe for evaluation. Dr. August Saucer saw this patient placed her on anti-inflammatory agents and accommodation muscle relaxers and narcotic medication. MRI scan was obtained which documented a large extruded herniated nucleus pulposus central and left-sided L5-S1.       Prior Functioning  Home Living Family/patient expects to be discharged to:: Private residence Living Arrangements: Spouse/significant other Available Help at Discharge: Family;Available 24 hours/day Type of Home: Apartment (plans to go to mother's apartment at d/c) Home Access: Level entry Home Layout: One level Home Equipment: None (however already ordered RW and 3n1 for home) Prior Function Level of Independence: Independent with assistive device(s) Comments: Pt has been ambulating with antalgic gait for ~ 4 weeks without AD.  Due to pain pt having difficulty completing ADLs.  Communication Communication: No difficulties Dominant Hand: Right    Cognition  Cognition Arousal/Alertness: Awake/alert Behavior During Therapy: WFL for tasks assessed/performed Overall Cognitive Status: Within Functional Limits for tasks assessed    Extremity/Trunk Assessment Lower Extremity Assessment Lower Extremity Assessment: RLE deficits/detail;LLE deficits/detail RLE Deficits / Details: wfl LLE: Unable to fully assess due to pain   Balance    End of Session PT - End of Session Equipment Utilized During Treatment: Gait belt Activity Tolerance: Patient tolerated treatment well Patient left: in bed;with call bell/phone within reach Nurse Communication: Mobility status;Precautions  GP Functional Limitation: Mobility: Walking and moving  around Mobility: Walking and Moving Around Current Status ([id number]): At least 1 percent but less than 20 percent impaired, limited or restricted Mobility: Walking and Moving Around Goal Status [phone]): 0 percent impaired, limited or restricted   Cleva Camero 12/11/2013, 11:06 AM  Jake Shark, PT DPT [phone]

## 2013-12-11 NOTE — Progress Notes (Signed)
Discharge instructions given to patient. Prescriptions given. Equipment delivered to room. No questions.
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
## 2013-12-12 NOTE — Progress Notes (Signed)
   CARE MANAGEMENT NOTE 12/12/2013  Patient:  [name] [name],[name] [name]   Account Number:  [id number]  Date Initiated:  12/12/2013  Documentation initiated by:  [facility]  Subjective/Objective Assessment:   adm: Herniated nucleus pulposus, lumbar     Action/Plan:   discharge plan   Anticipated DC Date:  12/11/2013   Anticipated DC Plan:  HOME W HOME HEALTH SERVICES      DC Planning Services  CM consult      [facility]  HOME HEALTH   Choice offered to / List presented to:  C-1 Patient        HH arranged  HH-2 PT      [facility] agency  [facility]   Status of service:  Completed, signed off Medicare Important Message given?   (If response is "NO", the following Medicare IM given date fields will be blank) Date Medicare IM given:   Date Additional Medicare IM given:    Discharge Disposition:  HOME W HOME HEALTH SERVICES  Per UR Regulation:    If discussed at Long Length of Stay Meetings, dates discussed:    Comments:  12/12/13 12:05 CM spoke with pt to offer choice.  Address and contact number verified; address of services will be [address], [place], [place] [address].  Referral with correct address faxed to [facility].  No other CM needs were communicated.  Freddy Jaksch, BSN, CM [phone].

## 2013-12-13 ENCOUNTER — Encounter (HOSPITAL_COMMUNITY): Payer: Self-pay | Admitting: Specialist

## 2014-01-17 ENCOUNTER — Ambulatory Visit: Payer: BC Managed Care – PPO | Attending: Specialist | Admitting: Physical Therapy

## 2014-01-17 DIAGNOSIS — Z86711 Personal history of pulmonary embolism: Secondary | ICD-10-CM | POA: Insufficient documentation

## 2014-01-17 DIAGNOSIS — IMO0001 Reserved for inherently not codable concepts without codable children: Secondary | ICD-10-CM | POA: Insufficient documentation

## 2014-01-17 DIAGNOSIS — M545 Low back pain, unspecified: Secondary | ICD-10-CM | POA: Insufficient documentation

## 2014-01-25 ENCOUNTER — Ambulatory Visit: Payer: BC Managed Care – PPO | Admitting: Physical Therapy

## 2014-06-06 ENCOUNTER — Encounter (HOSPITAL_COMMUNITY): Payer: Self-pay | Admitting: Emergency Medicine

## 2014-06-06 ENCOUNTER — Emergency Department (HOSPITAL_COMMUNITY)
Admission: EM | Admit: 2014-06-06 | Discharge: 2014-06-06 | Disposition: A | Discharge status: Home / Self Care | Payer: BC Managed Care – PPO | Attending: Emergency Medicine | Admitting: Emergency Medicine

## 2014-06-06 ENCOUNTER — Emergency Department (HOSPITAL_COMMUNITY): Payer: BC Managed Care – PPO

## 2014-06-06 DIAGNOSIS — Z862 Personal history of diseases of the blood and blood-forming organs and certain disorders involving the immune mechanism: Secondary | ICD-10-CM | POA: Insufficient documentation

## 2014-06-06 DIAGNOSIS — R197 Diarrhea, unspecified: Secondary | ICD-10-CM | POA: Insufficient documentation

## 2014-06-06 DIAGNOSIS — Z8719 Personal history of other diseases of the digestive system: Secondary | ICD-10-CM | POA: Insufficient documentation

## 2014-06-06 DIAGNOSIS — F17200 Nicotine dependence, unspecified, uncomplicated: Secondary | ICD-10-CM | POA: Insufficient documentation

## 2014-06-06 DIAGNOSIS — Z87828 Personal history of other (healed) physical injury and trauma: Secondary | ICD-10-CM | POA: Insufficient documentation

## 2014-06-06 DIAGNOSIS — R1031 Right lower quadrant pain: Secondary | ICD-10-CM

## 2014-06-06 DIAGNOSIS — Z86711 Personal history of pulmonary embolism: Secondary | ICD-10-CM | POA: Insufficient documentation

## 2014-06-06 DIAGNOSIS — Z79899 Other long term (current) drug therapy: Secondary | ICD-10-CM | POA: Insufficient documentation

## 2014-06-06 DIAGNOSIS — Z8742 Personal history of other diseases of the female genital tract: Secondary | ICD-10-CM | POA: Insufficient documentation

## 2014-06-06 DIAGNOSIS — Z8639 Personal history of other endocrine, nutritional and metabolic disease: Secondary | ICD-10-CM | POA: Insufficient documentation

## 2014-06-06 DIAGNOSIS — R112 Nausea with vomiting, unspecified: Secondary | ICD-10-CM | POA: Insufficient documentation

## 2014-06-06 LAB — CBC WITH DIFFERENTIAL/PLATELET
BASOS PCT: 1 % (ref 0–1)
Basophils Absolute: 0 10*3/uL (ref 0.0–0.1)
EOS ABS: 0 10*3/uL (ref 0.0–0.7)
Eosinophils Relative: 1 % (ref 0–5)
HEMATOCRIT: 42.2 % (ref 36.0–46.0)
Hemoglobin: 14.7 g/dL (ref 12.0–15.0)
Lymphocytes Relative: 35 % (ref 12–46)
Lymphs Abs: 2.1 10*3/uL (ref 0.7–4.0)
MCH: 31.8 pg (ref 26.0–34.0)
MCHC: 34.8 g/dL (ref 30.0–36.0)
MCV: 91.3 fL (ref 78.0–100.0)
MONO ABS: 0.7 10*3/uL (ref 0.1–1.0)
Monocytes Relative: 11 % (ref 3–12)
Neutro Abs: 3.1 10*3/uL (ref 1.7–7.7)
Neutrophils Relative %: 52 % (ref 43–77)
Platelets: 324 10*3/uL (ref 150–400)
RBC: 4.62 MIL/uL (ref 3.87–5.11)
RDW: 12.3 % (ref 11.5–15.5)
WBC: 5.9 10*3/uL (ref 4.0–10.5)

## 2014-06-06 LAB — BASIC METABOLIC PANEL
BUN: 8 mg/dL (ref 6–23)
CHLORIDE: 102 meq/L (ref 96–112)
CO2: 23 mEq/L (ref 19–32)
Calcium: 9.1 mg/dL (ref 8.4–10.5)
Creatinine, Ser: 0.7 mg/dL (ref 0.50–1.10)
GFR calc Af Amer: >90 mL/min (ref >90)
GLUCOSE: 86 mg/dL (ref 70–99)
POTASSIUM: 4.2 meq/L (ref 3.7–5.3)
Sodium: 137 mEq/L (ref 137–147)

## 2014-06-06 LAB — HEPATIC FUNCTION PANEL
ALBUMIN: 4 g/dL (ref 3.5–5.2)
ALK PHOS: 74 U/L (ref 39–117)
ALT: 12 U/L (ref 0–35)
AST: 16 U/L (ref 0–37)
BILIRUBIN TOTAL: 0.5 mg/dL (ref 0.3–1.2)
Bilirubin, Direct: <0.2 mg/dL (ref 0.0–0.3)
Total Protein: 7.3 g/dL (ref 6.0–8.3)

## 2014-06-06 LAB — URINALYSIS, ROUTINE W REFLEX MICROSCOPIC
Bilirubin Urine: NEGATIVE
GLUCOSE, UA: NEGATIVE mg/dL
KETONES UR: NEGATIVE mg/dL
Leukocytes, UA: NEGATIVE
Nitrite: NEGATIVE
PROTEIN: NEGATIVE mg/dL
Specific Gravity, Urine: 1.014 (ref 1.005–1.030)
Urobilinogen, UA: 1 mg/dL (ref 0.0–1.0)
pH: 5.5 (ref 5.0–8.0)

## 2014-06-06 LAB — URINE MICROSCOPIC-ADD ON

## 2014-06-06 LAB — LIPASE, BLOOD: Lipase: 17 U/L (ref 11–59)

## 2014-06-06 MED ORDER — ONDANSETRON HCL 4 MG/2ML IJ SOLN
4.0000 mg | Freq: Once | INTRAMUSCULAR | Status: AC
  Administered 2014-06-06: 4 mg via INTRAVENOUS
  Filled 2014-06-06: qty 2

## 2014-06-06 MED ORDER — ONDANSETRON HCL 4 MG/2ML IJ SOLN: 4.0000 mg | Freq: Once | INTRAMUSCULAR | Status: DC

## 2014-06-06 MED ORDER — IOHEXOL 300 MG/ML  SOLN
25.0000 mL | INTRAMUSCULAR | Status: AC
  Administered 2014-06-06: 25 mL via ORAL

## 2014-06-06 MED ORDER — IOHEXOL 300 MG/ML  SOLN
100.0000 mL | Freq: Once | INTRAMUSCULAR | Status: AC | PRN
  Administered 2014-06-06: 100 mL via INTRAVENOUS

## 2014-06-06 MED ORDER — OXYCODONE-ACETAMINOPHEN 5-325 MG PO TABS: 1.0000 | ORAL_TABLET | Freq: Four times a day (QID) | ORAL | Status: DC | PRN

## 2014-06-06 MED ORDER — SODIUM CHLORIDE 0.9 % IV BOLUS (SEPSIS)
1000.0000 mL | INTRAVENOUS | Status: AC
  Administered 2014-06-06: 1000 mL via INTRAVENOUS

## 2014-06-06 MED ORDER — HYDROMORPHONE HCL PF 1 MG/ML IJ SOLN
1.0000 mg | INTRAMUSCULAR | Status: AC
  Administered 2014-06-06: 1 mg via INTRAVENOUS
  Filled 2014-06-06: qty 1

## 2014-06-06 MED ORDER — ONDANSETRON 4 MG PO TBDP: ORAL_TABLET | ORAL | Status: DC
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
## 2014-06-06 NOTE — ED Notes (Signed)
CT notified pt has completed PO contrast

## 2014-06-06 NOTE — ED Notes (Signed)
Pt reports right sided abdominal pain that started last night, radiation to right flank.  Pain level 10/10 on arrival to the ED.  Pt appears uncomfortable and in pain on arrival.

## 2014-06-06 NOTE — ED Notes (Signed)
Pt very upset regarding discharge process, pt requesting discharge RNs name, pt provided requested information, pt verbalizes understanding of discharge instructions, pt taken to Consultation room until Charge RN available to speak with pt, pt met me in the hall & wants to leave, pt ambulatory

## 2014-06-06 NOTE — Discharge Instructions (Signed)
Abdominal Pain, Women °Abdominal (stomach, pelvic, or belly) pain can be caused by many things. It is important to tell your doctor: °· The location of the pain. °· Does it come and go or is it present all the time? °· Are there things that start the pain (eating certain foods, exercise)? °· Are there other symptoms associated with the pain (fever, nausea, vomiting, diarrhea)? °All of this is helpful to know when trying to find the cause of the pain. °CAUSES  °· Stomach: virus or bacteria infection, or ulcer. °· Intestine: appendicitis (inflamed appendix), regional ileitis (Crohn's disease), ulcerative colitis (inflamed colon), irritable bowel syndrome, diverticulitis (inflamed diverticulum of the colon), or cancer of the stomach or intestine. °· Gallbladder disease or stones in the gallbladder. °· Kidney disease, kidney stones, or infection. °· Pancreas infection or cancer. °· Fibromyalgia (pain disorder). °· Diseases of the female organs: °¨ Uterus: fibroid (non-cancerous) tumors or infection. °¨ Fallopian tubes: infection or tubal pregnancy. °¨ Ovary: cysts or tumors. °¨ Pelvic adhesions (scar tissue). °¨ Endometriosis (uterus lining tissue growing in the pelvis and on the pelvic organs). °¨ Pelvic congestion syndrome (female organs filling up with blood just before the menstrual period). °¨ Pain with the menstrual period. °¨ Pain with ovulation (producing an egg). °¨ Pain with an IUD (intrauterine device, birth control) in the uterus. °¨ Cancer of the female organs. °· Functional pain (pain not caused by a disease, may improve without treatment). °· Psychological pain. °· Depression. °DIAGNOSIS  °Your doctor will decide the seriousness of your pain by doing an examination. °· Blood tests. °· X-rays. °· Ultrasound. °· CT scan (computed tomography, special type of X-ray). °· MRI (magnetic resonance imaging). °· Cultures, for infection. °· Barium enema (dye inserted in the large intestine, to better view it with  X-rays). °· Colonoscopy (looking in intestine with a lighted tube). °· Laparoscopy (minor surgery, looking in abdomen with a lighted tube). °· Major abdominal exploratory surgery (looking in abdomen with a large incision). °TREATMENT  °The treatment will depend on the cause of the pain.  °· Many cases can be observed and treated at home. °· Over-the-counter medicines recommended by your caregiver. °· Prescription medicine. °· Antibiotics, for infection. °· Birth control pills, for painful periods or for ovulation pain. °· Hormone treatment, for endometriosis. °· Nerve blocking injections. °· Physical therapy. °· Antidepressants. °· Counseling with a psychologist or psychiatrist. °· Minor or major surgery. °HOME CARE INSTRUCTIONS  °· Do not take laxatives, unless directed by your caregiver. °· Take over-the-counter pain medicine only if ordered by your caregiver. Do not take aspirin because it can cause an upset stomach or bleeding. °· Try a clear liquid diet (broth or water) as ordered by your caregiver. Slowly move to a bland diet, as tolerated, if the pain is related to the stomach or intestine. °· Have a thermometer and take your temperature several times a day, and record it. °· Bed rest and sleep, if it helps the pain. °· Avoid sexual intercourse, if it causes pain. °· Avoid stressful situations. °· Keep your follow-up appointments and tests, as your caregiver orders. °· If the pain does not go away with medicine or surgery, you may try: °¨ Acupuncture. °¨ Relaxation exercises (yoga, meditation). °¨ Group therapy. °¨ Counseling. °SEEK MEDICAL CARE IF:  °· You notice certain foods cause stomach pain. °· Your home care treatment is not helping your pain. °· You need stronger pain medicine. °· You want your IUD removed. °· You feel faint or   lightheaded. °· You develop nausea and vomiting. °· You develop a rash. °· You are having side effects or an allergy to your medicine. °SEEK IMMEDIATE MEDICAL CARE IF:  °· Your  pain does not go away or gets worse. °· You have a fever. °· Your pain is felt only in portions of the abdomen. The right side could possibly be appendicitis. The left lower portion of the abdomen could be colitis or diverticulitis. °· You are passing blood in your stools (bright red or black tarry stools, with or without vomiting). °· You have blood in your urine. °· You develop chills, with or without a fever. °· You pass out. °MAKE SURE YOU:  °· Understand these instructions. °· Will watch your condition. °· Will get help right away if you are not doing well or get worse. °Document Released: 09/29/2007 Document Revised: 02/24/2012 Document Reviewed: 10/19/2009 °[employer]® Patient Information ©2015 [employer]. This information is not intended to replace advice given to you by your health care provider. Make sure you discuss any questions you have with your health care provider. ° °

## 2014-06-12 ENCOUNTER — Encounter (HOSPITAL_COMMUNITY): Payer: Self-pay | Admitting: Emergency Medicine

## 2014-06-12 ENCOUNTER — Emergency Department (HOSPITAL_COMMUNITY)
Admission: EM | Admit: 2014-06-12 | Discharge: 2014-06-12 | Disposition: A | Discharge status: Home / Self Care | Payer: BC Managed Care – PPO | Attending: Emergency Medicine | Admitting: Emergency Medicine

## 2014-06-12 ENCOUNTER — Emergency Department (HOSPITAL_COMMUNITY): Payer: BC Managed Care – PPO

## 2014-06-12 DIAGNOSIS — R112 Nausea with vomiting, unspecified: Secondary | ICD-10-CM | POA: Insufficient documentation

## 2014-06-12 DIAGNOSIS — J189 Pneumonia, unspecified organism: Secondary | ICD-10-CM

## 2014-06-12 DIAGNOSIS — Z9889 Other specified postprocedural states: Secondary | ICD-10-CM | POA: Insufficient documentation

## 2014-06-12 DIAGNOSIS — Z8742 Personal history of other diseases of the female genital tract: Secondary | ICD-10-CM | POA: Insufficient documentation

## 2014-06-12 DIAGNOSIS — J159 Unspecified bacterial pneumonia: Secondary | ICD-10-CM | POA: Insufficient documentation

## 2014-06-12 DIAGNOSIS — R1084 Generalized abdominal pain: Secondary | ICD-10-CM | POA: Insufficient documentation

## 2014-06-12 DIAGNOSIS — Z862 Personal history of diseases of the blood and blood-forming organs and certain disorders involving the immune mechanism: Secondary | ICD-10-CM | POA: Insufficient documentation

## 2014-06-12 DIAGNOSIS — Z8639 Personal history of other endocrine, nutritional and metabolic disease: Secondary | ICD-10-CM | POA: Insufficient documentation

## 2014-06-12 DIAGNOSIS — F17200 Nicotine dependence, unspecified, uncomplicated: Secondary | ICD-10-CM | POA: Insufficient documentation

## 2014-06-12 DIAGNOSIS — Z86711 Personal history of pulmonary embolism: Secondary | ICD-10-CM | POA: Insufficient documentation

## 2014-06-12 DIAGNOSIS — Z8719 Personal history of other diseases of the digestive system: Secondary | ICD-10-CM | POA: Insufficient documentation

## 2014-06-12 LAB — URINALYSIS, ROUTINE W REFLEX MICROSCOPIC
Bilirubin Urine: NEGATIVE
Glucose, UA: NEGATIVE mg/dL
Ketones, ur: NEGATIVE mg/dL
Leukocytes, UA: NEGATIVE
NITRITE: NEGATIVE
Protein, ur: NEGATIVE mg/dL
Specific Gravity, Urine: 1.02 (ref 1.005–1.030)
UROBILINOGEN UA: 0.2 mg/dL (ref 0.0–1.0)
pH: 5.5 (ref 5.0–8.0)

## 2014-06-12 LAB — COMPREHENSIVE METABOLIC PANEL
ALT: 12 U/L (ref 0–35)
AST: 16 U/L (ref 0–37)
Albumin: 4.1 g/dL (ref 3.5–5.2)
Alkaline Phosphatase: 102 U/L (ref 39–117)
BUN: 12 mg/dL (ref 6–23)
CALCIUM: 8.9 mg/dL (ref 8.4–10.5)
CO2: 21 mEq/L (ref 19–32)
CREATININE: 0.76 mg/dL (ref 0.50–1.10)
Chloride: 99 mEq/L (ref 96–112)
GFR calc non Af Amer: >90 mL/min (ref >90)
GLUCOSE: 98 mg/dL (ref 70–99)
Potassium: 3.9 mEq/L (ref 3.7–5.3)
SODIUM: 136 meq/L — AB (ref 137–147)
TOTAL PROTEIN: 7.6 g/dL (ref 6.0–8.3)
Total Bilirubin: 0.4 mg/dL (ref 0.3–1.2)

## 2014-06-12 LAB — CBC WITH DIFFERENTIAL/PLATELET
Basophils Absolute: 0.1 10*3/uL (ref 0.0–0.1)
Basophils Relative: 1 % (ref 0–1)
EOS ABS: 0.1 10*3/uL (ref 0.0–0.7)
EOS PCT: 2 % (ref 0–5)
HCT: 42.4 % (ref 36.0–46.0)
Hemoglobin: 14.7 g/dL (ref 12.0–15.0)
Lymphocytes Relative: 35 % (ref 12–46)
Lymphs Abs: 2.1 10*3/uL (ref 0.7–4.0)
MCH: 31.6 pg (ref 26.0–34.0)
MCHC: 34.7 g/dL (ref 30.0–36.0)
MCV: 91.2 fL (ref 78.0–100.0)
MONO ABS: 0.5 10*3/uL (ref 0.1–1.0)
Monocytes Relative: 8 % (ref 3–12)
Neutro Abs: 3.2 10*3/uL (ref 1.7–7.7)
Neutrophils Relative %: 54 % (ref 43–77)
PLATELETS: 307 10*3/uL (ref 150–400)
RBC: 4.65 MIL/uL (ref 3.87–5.11)
RDW: 12.5 % (ref 11.5–15.5)
WBC: 5.9 10*3/uL (ref 4.0–10.5)

## 2014-06-12 LAB — RAPID URINE DRUG SCREEN, HOSP PERFORMED
Amphetamines: NOT DETECTED
Barbiturates: NOT DETECTED
Benzodiazepines: NOT DETECTED
Cocaine: NOT DETECTED
OPIATES: NOT DETECTED
Tetrahydrocannabinol: NOT DETECTED

## 2014-06-12 LAB — LIPASE, BLOOD: LIPASE: 25 U/L (ref 11–59)

## 2014-06-12 LAB — URINE MICROSCOPIC-ADD ON

## 2014-06-12 MED ORDER — SODIUM CHLORIDE 0.9 % IV BOLUS (SEPSIS)
1000.0000 mL | Freq: Once | INTRAVENOUS | Status: AC
  Administered 2014-06-12: 1000 mL via INTRAVENOUS

## 2014-06-12 MED ORDER — METOCLOPRAMIDE HCL 5 MG/ML IJ SOLN
5.0000 mg | Freq: Once | INTRAMUSCULAR | Status: AC
Start: 2014-06-12 — End: 2014-06-12
  Administered 2014-06-12: 5 mg via INTRAVENOUS
  Filled 2014-06-12: qty 2

## 2014-06-12 MED ORDER — ONDANSETRON HCL 4 MG PO TABS: 4.0000 mg | ORAL_TABLET | Freq: Four times a day (QID) | ORAL | Status: DC

## 2014-06-12 MED ORDER — ONDANSETRON HCL 4 MG/2ML IJ SOLN
4.0000 mg | Freq: Once | INTRAMUSCULAR | Status: AC
  Administered 2014-06-12: 4 mg via INTRAVENOUS
  Filled 2014-06-12: qty 2

## 2014-06-12 MED ORDER — LEVOFLOXACIN 750 MG PO TABS: 750.0000 mg | ORAL_TABLET | Freq: Every day | ORAL | Status: DC

## 2014-06-12 MED ORDER — HYDROMORPHONE HCL PF 1 MG/ML IJ SOLN
1.0000 mg | Freq: Once | INTRAMUSCULAR | Status: AC
  Administered 2014-06-12: 1 mg via INTRAVENOUS
  Filled 2014-06-12: qty 1

## 2014-06-12 MED ORDER — METOCLOPRAMIDE HCL 10 MG PO TABS: 10.0000 mg | ORAL_TABLET | Freq: Four times a day (QID) | ORAL | Status: DC | PRN

## 2014-06-12 MED ORDER — FENTANYL CITRATE 0.05 MG/ML IJ SOLN
100.0000 ug | Freq: Once | INTRAMUSCULAR | Status: AC
  Administered 2014-06-12: 100 ug via INTRAVENOUS
  Filled 2014-06-12: qty 2

## 2014-06-12 MED ORDER — METOCLOPRAMIDE HCL 5 MG/ML IJ SOLN
10.0000 mg | Freq: Once | INTRAMUSCULAR | Status: AC
Start: 2014-06-12 — End: 2014-06-12
  Administered 2014-06-12: 10 mg via INTRAVENOUS
  Filled 2014-06-12: qty 2

## 2014-06-12 MED ORDER — HYDROCODONE-ACETAMINOPHEN 5-325 MG PO TABS: 2.0000 | ORAL_TABLET | ORAL | Status: DC | PRN
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
## 2014-06-12 NOTE — ED Notes (Signed)
PER EMS:pt from home, reports abdominal pain, right side and under ribs, nausea and vomiting. Was seen here on Monday for same and discharged. Pt reports the pain is just getting worse. Upon arrival, pt vomiting and reports around 0100 this morning prior to calling EMS pt took one 4mg  zofran ODT with no relief. Pt reports she ate pizza, sausage and hot dogs recently. A&OX4. BP-128/62, HR-78, O2-98% RA.

## 2014-06-12 NOTE — ED Notes (Signed)
Pt wretching, writhing in bed, states "I'm dying, please help me"

## 2014-06-12 NOTE — ED Notes (Signed)
Dr. Pollina at bedside   

## 2014-06-12 NOTE — Discharge Instructions (Signed)
Pneumonia, Adult  Pneumonia is an infection of the lungs. It may be caused by a germ (virus or bacteria). Some types of pneumonia can spread easily from person to person. This can happen when you cough or sneeze.  HOME CARE   Only take medicine as told by your doctor.   Take your medicine (antibiotics) as told. Finish it even if you start to feel better.   Do not smoke.   You may use a vaporizer or humidifier in your room. This can help loosen thick spit (mucus).   Sleep so you are almost sitting up (semi-upright). This helps reduce coughing.   Rest.  A shot (vaccine) can help prevent pneumonia. Shots are often advised for:   People over 65 years old.   Patients on chemotherapy.   People with long-term (chronic) lung problems.   People with immune system problems.  GET HELP RIGHT AWAY IF:    You are getting worse.   You cannot control your cough, and you are losing sleep.   You cough up blood.   Your pain gets worse, even with medicine.   You have a fever.   Any of your problems are getting worse, not better.   You have shortness of breath or chest pain.  MAKE SURE YOU:    Understand these instructions.   Will watch your condition.   Will get help right away if you are not doing well or get worse.  Document Released: 05/20/2008 Document Revised: 02/24/2012 Document Reviewed: 02/22/2011  Exit[employer] Patient Information 2015 [employer]. This information is not intended to replace advice given to you by your health care provider. Make sure you discuss any questions you have with your health care provider.

## 2014-12-23 ENCOUNTER — Other Ambulatory Visit: Payer: Self-pay | Admitting: Specialist

## 2014-12-23 DIAGNOSIS — M545 Low back pain: Secondary | ICD-10-CM

## 2014-12-29 ENCOUNTER — Ambulatory Visit
Admission: RE | Admit: 2014-12-29 | Discharge: 2014-12-29 | Disposition: A | Discharge status: Home / Self Care | Payer: BLUE CROSS/BLUE SHIELD | Source: Ambulatory Visit | Attending: Specialist | Admitting: Specialist

## 2014-12-29 DIAGNOSIS — M545 Low back pain: Secondary | ICD-10-CM

## 2014-12-29 MED ORDER — GADOBENATE DIMEGLUMINE 529 MG/ML IV SOLN
14.0000 mL | Freq: Once | INTRAVENOUS | Status: AC | PRN
  Administered 2014-12-29: 14 mL via INTRAVENOUS

## 2014-12-30 ENCOUNTER — Other Ambulatory Visit (HOSPITAL_COMMUNITY): Payer: BLUE CROSS/BLUE SHIELD | Admitting: Specialist

## 2014-12-30 ENCOUNTER — Ambulatory Visit (HOSPITAL_COMMUNITY): Payer: BLUE CROSS/BLUE SHIELD | Admitting: Anesthesiology

## 2014-12-30 ENCOUNTER — Encounter (HOSPITAL_COMMUNITY)
Admission: RE | Disposition: A | Discharge status: Home / Self Care | Payer: Self-pay | Source: Ambulatory Visit | Attending: Specialist

## 2014-12-30 ENCOUNTER — Encounter (HOSPITAL_COMMUNITY): Payer: Self-pay | Admitting: *Deleted

## 2014-12-30 ENCOUNTER — Ambulatory Visit (HOSPITAL_COMMUNITY): Payer: BLUE CROSS/BLUE SHIELD

## 2014-12-30 ENCOUNTER — Observation Stay (HOSPITAL_COMMUNITY)
Admission: RE | Admit: 2014-12-30 | Discharge: 2014-12-31 | Disposition: A | Discharge status: Home / Self Care | Payer: BLUE CROSS/BLUE SHIELD | Source: Ambulatory Visit | Attending: Specialist | Admitting: Specialist

## 2014-12-30 DIAGNOSIS — D6859 Other primary thrombophilia: Secondary | ICD-10-CM | POA: Diagnosis not present

## 2014-12-30 DIAGNOSIS — Z419 Encounter for procedure for purposes other than remedying health state, unspecified: Secondary | ICD-10-CM

## 2014-12-30 DIAGNOSIS — F17210 Nicotine dependence, cigarettes, uncomplicated: Secondary | ICD-10-CM | POA: Insufficient documentation

## 2014-12-30 DIAGNOSIS — Z86711 Personal history of pulmonary embolism: Secondary | ICD-10-CM | POA: Diagnosis not present

## 2014-12-30 DIAGNOSIS — K219 Gastro-esophageal reflux disease without esophagitis: Secondary | ICD-10-CM | POA: Insufficient documentation

## 2014-12-30 DIAGNOSIS — M5126 Other intervertebral disc displacement, lumbar region: Secondary | ICD-10-CM | POA: Diagnosis present

## 2014-12-30 DIAGNOSIS — IMO0002 Reserved for concepts with insufficient information to code with codable children: Secondary | ICD-10-CM

## 2014-12-30 HISTORY — PX: LUMBAR LAMINECTOMY: SHX95

## 2014-12-30 LAB — COMPREHENSIVE METABOLIC PANEL
ALBUMIN: 3.8 g/dL (ref 3.5–5.2)
ALT: 12 U/L (ref 0–35)
AST: 17 U/L (ref 0–37)
Alkaline Phosphatase: 58 U/L (ref 39–117)
Anion gap: 10 (ref 5–15)
BILIRUBIN TOTAL: 0.6 mg/dL (ref 0.3–1.2)
BUN: <5 mg/dL — ABNORMAL LOW (ref 6–23)
CALCIUM: 9 mg/dL (ref 8.4–10.5)
CO2: 25 mmol/L (ref 19–32)
CREATININE: 0.8 mg/dL (ref 0.50–1.10)
Chloride: 104 mEq/L (ref 96–112)
GFR, EST NON AFRICAN AMERICAN: 89 mL/min — AB (ref >90)
Glucose, Bld: 95 mg/dL (ref 70–99)
Potassium: 4.2 mmol/L (ref 3.5–5.1)
Sodium: 139 mmol/L (ref 135–145)
Total Protein: 6.6 g/dL (ref 6.0–8.3)

## 2014-12-30 LAB — SURGICAL PCR SCREEN
MRSA, PCR: NEGATIVE
STAPHYLOCOCCUS AUREUS: NEGATIVE

## 2014-12-30 LAB — CBC
HEMATOCRIT: 40.7 % (ref 36.0–46.0)
Hemoglobin: 14 g/dL (ref 12.0–15.0)
MCH: 31.1 pg (ref 26.0–34.0)
MCHC: 34.4 g/dL (ref 30.0–36.0)
MCV: 90.4 fL (ref 78.0–100.0)
Platelets: 298 10*3/uL (ref 150–400)
RBC: 4.5 MIL/uL (ref 3.87–5.11)
RDW: 12.3 % (ref 11.5–15.5)
WBC: 5.6 10*3/uL (ref 4.0–10.5)

## 2014-12-30 SURGERY — MICRODISCECTOMY LUMBAR LAMINECTOMY
Anesthesia: General | Site: Back

## 2014-12-30 MED ORDER — LIDOCAINE HCL (CARDIAC) 20 MG/ML IV SOLN
INTRAVENOUS | Status: DC | PRN
  Administered 2014-12-30: 50 mg via INTRAVENOUS
  Administered 2014-12-30: 75 mg via INTRATRACHEAL

## 2014-12-30 MED ORDER — ONDANSETRON 4 MG PO TBDP
4.0000 mg | ORAL_TABLET | Freq: Three times a day (TID) | ORAL | Status: DC | PRN
Start: 2014-12-30 — End: 2014-12-31
  Filled 2014-12-30: qty 1

## 2014-12-30 MED ORDER — PHENYLEPHRINE HCL 10 MG/ML IJ SOLN
INTRAMUSCULAR | Status: DC | PRN
Start: 2014-12-30 — End: 2014-12-30
  Administered 2014-12-30: 40 ug via INTRAVENOUS
  Administered 2014-12-30 (×3): 80 ug via INTRAVENOUS
  Administered 2014-12-30: 40 ug via INTRAVENOUS
  Administered 2014-12-30: 80 ug via INTRAVENOUS

## 2014-12-30 MED ORDER — CEFAZOLIN SODIUM 1-5 GM-% IV SOLN
1.0000 g | Freq: Three times a day (TID) | INTRAVENOUS | Status: AC
  Administered 2014-12-31 (×2): 1 g via INTRAVENOUS
  Filled 2014-12-30 (×2): qty 50

## 2014-12-30 MED ORDER — DEXAMETHASONE 4 MG PO TABS
4.0000 mg | ORAL_TABLET | Freq: Four times a day (QID) | ORAL | Status: DC
  Administered 2014-12-31: 4 mg via ORAL
  Filled 2014-12-30 (×6): qty 1

## 2014-12-30 MED ORDER — LACTATED RINGERS IV SOLN
INTRAVENOUS | Status: DC | PRN
  Administered 2014-12-30 (×4): via INTRAVENOUS

## 2014-12-30 MED ORDER — MUPIROCIN 2 % EX OINT
TOPICAL_OINTMENT | Freq: Once | CUTANEOUS | Status: AC
  Administered 2014-12-30: 16:00:00 via NASAL

## 2014-12-30 MED ORDER — MIDAZOLAM HCL 5 MG/5ML IJ SOLN
INTRAMUSCULAR | Status: DC | PRN
  Administered 2014-12-30: 2 mg via INTRAVENOUS

## 2014-12-30 MED ORDER — GABAPENTIN 300 MG PO CAPS
300.0000 mg | ORAL_CAPSULE | Freq: Two times a day (BID) | ORAL | Status: DC
  Administered 2014-12-31 (×2): 300 mg via ORAL
  Filled 2014-12-30 (×3): qty 1

## 2014-12-30 MED ORDER — SODIUM CHLORIDE 0.9 % IJ SOLN: 3.0000 mL | Freq: Two times a day (BID) | INTRAMUSCULAR | Status: DC

## 2014-12-30 MED ORDER — PANTOPRAZOLE SODIUM 40 MG IV SOLR
40.0000 mg | Freq: Every day | INTRAVENOUS | Status: DC
  Administered 2014-12-31: 40 mg via INTRAVENOUS
  Filled 2014-12-30 (×2): qty 40

## 2014-12-30 MED ORDER — VECURONIUM BROMIDE 10 MG IV SOLR
INTRAVENOUS | Status: DC | PRN
  Administered 2014-12-30: 2 mg via INTRAVENOUS
  Administered 2014-12-30: 1 mg via INTRAVENOUS
  Administered 2014-12-30: 2 mg via INTRAVENOUS
  Administered 2014-12-30: 1 mg via INTRAVENOUS
  Administered 2014-12-30: 2 mg via INTRAVENOUS
  Administered 2014-12-30: 1 mg via INTRAVENOUS

## 2014-12-30 MED ORDER — PHENOL 1.4 % MT LIQD: 1.0000 | OROMUCOSAL | Status: DC | PRN

## 2014-12-30 MED ORDER — THROMBIN 20000 UNITS EX KIT
PACK | CUTANEOUS | Status: DC | PRN
  Administered 2014-12-30: 17:00:00 via TOPICAL

## 2014-12-30 MED ORDER — FENTANYL CITRATE 0.05 MG/ML IJ SOLN
INTRAMUSCULAR | Status: AC
  Administered 2014-12-30: 50 ug via INTRAVENOUS
  Filled 2014-12-30: qty 2

## 2014-12-30 MED ORDER — ONDANSETRON HCL 4 MG/2ML IJ SOLN
INTRAMUSCULAR | Status: DC | PRN
  Administered 2014-12-30: 4 mg via INTRAVENOUS

## 2014-12-30 MED ORDER — PROPOFOL 10 MG/ML IV BOLUS
INTRAVENOUS | Status: AC
  Filled 2014-12-30: qty 20

## 2014-12-30 MED ORDER — EPHEDRINE SULFATE 50 MG/ML IJ SOLN
INTRAMUSCULAR | Status: DC | PRN
  Administered 2014-12-30: 5 mg via INTRAVENOUS
  Administered 2014-12-30 (×3): 10 mg via INTRAVENOUS
  Administered 2014-12-30 (×2): 5 mg via INTRAVENOUS

## 2014-12-30 MED ORDER — BUPIVACAINE HCL 0.5 % IJ SOLN
INTRAMUSCULAR | Status: DC | PRN
  Administered 2014-12-30: 20 mL

## 2014-12-30 MED ORDER — ONDANSETRON HCL 4 MG/2ML IJ SOLN
4.0000 mg | INTRAMUSCULAR | Status: DC | PRN
Start: 2014-12-30 — End: 2014-12-31

## 2014-12-30 MED ORDER — ROCURONIUM BROMIDE 100 MG/10ML IV SOLN
INTRAVENOUS | Status: DC | PRN
  Administered 2014-12-30: 40 mg via INTRAVENOUS
  Administered 2014-12-30: 10 mg via INTRAVENOUS

## 2014-12-30 MED ORDER — SODIUM CHLORIDE 0.9 % IJ SOLN: 3.0000 mL | INTRAMUSCULAR | Status: DC | PRN

## 2014-12-30 MED ORDER — ACETAMINOPHEN 650 MG RE SUPP
650.0000 mg | RECTAL | Status: DC | PRN
Start: 2014-12-30 — End: 2014-12-31

## 2014-12-30 MED ORDER — DEXAMETHASONE SODIUM PHOSPHATE 4 MG/ML IJ SOLN
4.0000 mg | Freq: Four times a day (QID) | INTRAMUSCULAR | Status: DC
  Administered 2014-12-31 (×2): 4 mg via INTRAVENOUS
  Filled 2014-12-30 (×6): qty 1

## 2014-12-30 MED ORDER — OXYCODONE-ACETAMINOPHEN 5-325 MG PO TABS
1.0000 | ORAL_TABLET | ORAL | Status: DC | PRN
  Administered 2014-12-30 – 2014-12-31 (×4): 2 via ORAL
  Filled 2014-12-30 (×3): qty 2

## 2014-12-30 MED ORDER — FENTANYL CITRATE 0.05 MG/ML IJ SOLN
INTRAMUSCULAR | Status: AC
  Filled 2014-12-30: qty 5

## 2014-12-30 MED ORDER — POTASSIUM CHLORIDE IN NACL 20-0.45 MEQ/L-% IV SOLN
INTRAVENOUS | Status: DC
  Administered 2014-12-31: 01:00:00 via INTRAVENOUS
  Filled 2014-12-30 (×2): qty 1000

## 2014-12-30 MED ORDER — MENTHOL 3 MG MT LOZG: 1.0000 | LOZENGE | OROMUCOSAL | Status: DC | PRN

## 2014-12-30 MED ORDER — HYDROMORPHONE HCL 1 MG/ML IJ SOLN
0.2500 mg | INTRAMUSCULAR | Status: DC | PRN
  Administered 2014-12-30 (×4): 0.5 mg via INTRAVENOUS

## 2014-12-30 MED ORDER — ALUM & MAG HYDROXIDE-SIMETH 200-200-20 MG/5ML PO SUSP: 30.0000 mL | Freq: Four times a day (QID) | ORAL | Status: DC | PRN

## 2014-12-30 MED ORDER — MUPIROCIN 2 % EX OINT
TOPICAL_OINTMENT | CUTANEOUS | Status: AC
  Filled 2014-12-30: qty 22

## 2014-12-30 MED ORDER — BUPIVACAINE LIPOSOME 1.3 % IJ SUSP
20.0000 mL | Freq: Once | INTRAMUSCULAR | Status: DC
  Filled 2014-12-30: qty 20

## 2014-12-30 MED ORDER — CEFAZOLIN SODIUM-DEXTROSE 2-3 GM-% IV SOLR
2.0000 g | INTRAVENOUS | Status: AC
  Administered 2014-12-30: 2 g via INTRAVENOUS
  Filled 2014-12-30: qty 50

## 2014-12-30 MED ORDER — DEXAMETHASONE SODIUM PHOSPHATE 4 MG/ML IJ SOLN
INTRAMUSCULAR | Status: DC | PRN
  Administered 2014-12-30: 8 mg via INTRAVENOUS

## 2014-12-30 MED ORDER — 0.9 % SODIUM CHLORIDE (POUR BTL) OPTIME
TOPICAL | Status: DC | PRN
  Administered 2014-12-30: 1000 mL

## 2014-12-30 MED ORDER — GABAPENTIN 300 MG PO CAPS: 300.0000 mg | ORAL_CAPSULE | Freq: Two times a day (BID) | ORAL | Status: DC

## 2014-12-30 MED ORDER — MORPHINE SULFATE 2 MG/ML IJ SOLN
1.0000 mg | INTRAMUSCULAR | Status: DC | PRN
  Administered 2014-12-31 (×3): 2 mg via INTRAVENOUS
  Filled 2014-12-30 (×3): qty 1

## 2014-12-30 MED ORDER — FENTANYL CITRATE 0.05 MG/ML IJ SOLN
50.0000 ug | INTRAMUSCULAR | Status: DC | PRN
  Administered 2014-12-30 (×2): 50 ug via INTRAVENOUS

## 2014-12-30 MED ORDER — THROMBIN 20000 UNITS EX SOLR
CUTANEOUS | Status: AC
  Filled 2014-12-30: qty 20000

## 2014-12-30 MED ORDER — LEVOFLOXACIN 750 MG PO TABS
750.0000 mg | ORAL_TABLET | Freq: Every day | ORAL | Status: DC
  Administered 2014-12-31: 750 mg via ORAL
  Filled 2014-12-30: qty 1

## 2014-12-30 MED ORDER — OXYCODONE-ACETAMINOPHEN 5-325 MG PO TABS
ORAL_TABLET | ORAL | Status: AC
  Filled 2014-12-30: qty 1

## 2014-12-30 MED ORDER — LACTATED RINGERS IV SOLN
INTRAVENOUS | Status: DC
Start: 2014-12-30 — End: 2014-12-31
  Administered 2014-12-30: 15:00:00 via INTRAVENOUS

## 2014-12-30 MED ORDER — NEOSTIGMINE METHYLSULFATE 10 MG/10ML IV SOLN
INTRAVENOUS | Status: DC | PRN
  Administered 2014-12-30: 4 mg via INTRAVENOUS

## 2014-12-30 MED ORDER — HYDROMORPHONE HCL 1 MG/ML IJ SOLN
INTRAMUSCULAR | Status: AC
  Filled 2014-12-30: qty 1

## 2014-12-30 MED ORDER — ONDANSETRON HCL 4 MG/2ML IJ SOLN: 4.0000 mg | Freq: Once | INTRAMUSCULAR | Status: DC | PRN

## 2014-12-30 MED ORDER — OXYCODONE-ACETAMINOPHEN 5-325 MG PO TABS: 1.0000 | ORAL_TABLET | ORAL | Status: DC | PRN

## 2014-12-30 MED ORDER — SODIUM CHLORIDE 0.9 % IV SOLN: 250.0000 mL | INTRAVENOUS | Status: DC

## 2014-12-30 MED ORDER — PHENYLEPHRINE 40 MCG/ML (10ML) SYRINGE FOR IV PUSH (FOR BLOOD PRESSURE SUPPORT)
PREFILLED_SYRINGE | INTRAVENOUS | Status: AC
  Filled 2014-12-30: qty 10

## 2014-12-30 MED ORDER — METOCLOPRAMIDE HCL 10 MG PO TABS: 10.0000 mg | ORAL_TABLET | Freq: Four times a day (QID) | ORAL | Status: DC | PRN

## 2014-12-30 MED ORDER — GLYCOPYRROLATE 0.2 MG/ML IJ SOLN
INTRAMUSCULAR | Status: DC | PRN
  Administered 2014-12-30: 0.6 mg via INTRAVENOUS

## 2014-12-30 MED ORDER — BUPIVACAINE LIPOSOME 1.3 % IJ SUSP
INTRAMUSCULAR | Status: DC | PRN
  Administered 2014-12-30: 20 mL

## 2014-12-30 MED ORDER — OXYCODONE-ACETAMINOPHEN 5-325 MG PO TABS
ORAL_TABLET | ORAL | Status: AC
Start: 2014-12-30 — End: 2014-12-31
  Filled 2014-12-30: qty 1

## 2014-12-30 MED ORDER — MIDAZOLAM HCL 2 MG/2ML IJ SOLN
INTRAMUSCULAR | Status: AC
  Filled 2014-12-30: qty 2

## 2014-12-30 MED ORDER — PROPOFOL 10 MG/ML IV BOLUS
INTRAVENOUS | Status: DC | PRN
  Administered 2014-12-30: 140 mg via INTRAVENOUS

## 2014-12-30 MED ORDER — FENTANYL CITRATE 0.05 MG/ML IJ SOLN
INTRAMUSCULAR | Status: DC | PRN
  Administered 2014-12-30 (×3): 50 ug via INTRAVENOUS
  Administered 2014-12-30: 100 ug via INTRAVENOUS
  Administered 2014-12-30: 50 ug via INTRAVENOUS

## 2014-12-30 MED ORDER — HYDROCODONE-ACETAMINOPHEN 5-325 MG PO TABS: 1.0000 | ORAL_TABLET | ORAL | Status: DC | PRN

## 2014-12-30 MED ORDER — METHOCARBAMOL 1000 MG/10ML IJ SOLN
500.0000 mg | Freq: Four times a day (QID) | INTRAVENOUS | Status: DC | PRN
  Administered 2014-12-30: 500 mg via INTRAVENOUS
  Filled 2014-12-30 (×2): qty 5

## 2014-12-30 MED ORDER — METHOCARBAMOL 500 MG PO TABS
500.0000 mg | ORAL_TABLET | Freq: Four times a day (QID) | ORAL | Status: DC | PRN
  Administered 2014-12-31 (×2): 500 mg via ORAL
  Filled 2014-12-30 (×3): qty 1

## 2014-12-30 MED ORDER — ACETAMINOPHEN 325 MG PO TABS: 650.0000 mg | ORAL_TABLET | ORAL | Status: DC | PRN

## 2014-12-30 SURGICAL SUPPLY — 60 items
ADH SKN CLS APL DERMABOND .7 (GAUZE/BANDAGES/DRESSINGS) ×1
BUR MATCHSTICK NEURO 3.0 LAGG (BURR) ×1 IMPLANT
BUR SABER RD CUTTING 3.0 (BURR) ×1 IMPLANT
CANISTER SUCTION 2500CC (MISCELLANEOUS) ×2 IMPLANT
CATH FOLEY 2WAY SLVR  5CC 16FR (CATHETERS) ×1
CATH FOLEY 2WAY SLVR  5CC 24FR (CATHETERS) ×1
CATH FOLEY 2WAY SLVR 5CC 16FR (CATHETERS) IMPLANT
CATH FOLEY 2WAY SLVR 5CC 24FR (CATHETERS) IMPLANT
COVER MAYO STAND STRL (DRAPES) ×2 IMPLANT
COVER SURGICAL LIGHT HANDLE (MISCELLANEOUS) ×2 IMPLANT
DERMABOND ADVANCED (GAUZE/BANDAGES/DRESSINGS) ×1
DERMABOND ADVANCED .7 DNX12 (GAUZE/BANDAGES/DRESSINGS) ×1 IMPLANT
DRAPE C-ARM 42X72 X-RAY (DRAPES) ×2 IMPLANT
DRAPE MICROSCOPE LEICA (MISCELLANEOUS) ×2 IMPLANT
DRAPE PROXIMA HALF (DRAPES) IMPLANT
DRAPE SURG 17X23 STRL (DRAPES) ×8 IMPLANT
DRSG MEPILEX BORDER 4X4 (GAUZE/BANDAGES/DRESSINGS) IMPLANT
DRSG MEPILEX BORDER 4X8 (GAUZE/BANDAGES/DRESSINGS) ×1 IMPLANT
DURAPREP 26ML APPLICATOR (WOUND CARE) ×2 IMPLANT
DURASEAL SPINE SEALANT 3ML (MISCELLANEOUS) ×1 IMPLANT
ELECT BLADE 4.0 EZ CLEAN MEGAD (MISCELLANEOUS) ×2
ELECT BLADE 6.5 EXT (BLADE) ×1 IMPLANT
ELECT CAUTERY BLADE 6.4 (BLADE) ×2 IMPLANT
ELECT REM PT RETURN 9FT ADLT (ELECTROSURGICAL) ×2
ELECTRODE BLDE 4.0 EZ CLN MEGD (MISCELLANEOUS) ×1 IMPLANT
ELECTRODE REM PT RTRN 9FT ADLT (ELECTROSURGICAL) ×1 IMPLANT
GLOVE BIOGEL PI IND STRL 7.0 (GLOVE) IMPLANT
GLOVE BIOGEL PI IND STRL 8 (GLOVE) ×1 IMPLANT
GLOVE BIOGEL PI INDICATOR 7.0 (GLOVE) ×2
GLOVE BIOGEL PI INDICATOR 8 (GLOVE) ×2
GLOVE ECLIPSE 8.5 STRL (GLOVE) ×2 IMPLANT
GLOVE ORTHO TXT STRL SZ7.5 (GLOVE) ×2 IMPLANT
GLOVE SURG 8.5 LATEX PF (GLOVE) ×2 IMPLANT
GLOVE SURG SS PI 7.0 STRL IVOR (GLOVE) ×2 IMPLANT
GLOVE SURG SS PI 8.0 STRL IVOR (GLOVE) ×1 IMPLANT
GOWN STRL REUS W/ TWL LRG LVL3 (GOWN DISPOSABLE) ×2 IMPLANT
GOWN STRL REUS W/TWL 2XL LVL3 (GOWN DISPOSABLE) ×4 IMPLANT
GOWN STRL REUS W/TWL LRG LVL3 (GOWN DISPOSABLE) ×4
KIT BASIN OR (CUSTOM PROCEDURE TRAY) ×2 IMPLANT
KIT ROOM TURNOVER OR (KITS) ×2 IMPLANT
NDL SPNL 18GX3.5 QUINCKE PK (NEEDLE) ×2 IMPLANT
NEEDLE SPNL 18GX3.5 QUINCKE PK (NEEDLE) ×4 IMPLANT
NS IRRIG 1000ML POUR BTL (IV SOLUTION) ×2 IMPLANT
PACK LAMINECTOMY ORTHO (CUSTOM PROCEDURE TRAY) ×2 IMPLANT
PAD ARMBOARD 7.5X6 YLW CONV (MISCELLANEOUS) ×4 IMPLANT
PATTIES SURGICAL .5 X.5 (GAUZE/BANDAGES/DRESSINGS) IMPLANT
PATTIES SURGICAL .75X.75 (GAUZE/BANDAGES/DRESSINGS) IMPLANT
SPONGE LAP 4X18 X RAY DECT (DISPOSABLE) IMPLANT
SPONGE SURGIFOAM ABS GEL 100 (HEMOSTASIS) IMPLANT
SUT VIC AB 1 CT1 27 (SUTURE)
SUT VIC AB 1 CT1 27XBRD ANBCTR (SUTURE) IMPLANT
SUT VIC AB 2-0 CT1 27 (SUTURE)
SUT VIC AB 2-0 CT1 TAPERPNT 27 (SUTURE) IMPLANT
SUT VICRYL 0 UR6 27IN ABS (SUTURE) ×1 IMPLANT
SUT VICRYL 4-0 PS2 18IN ABS (SUTURE) IMPLANT
SYR 20CC LL (SYRINGE) ×2 IMPLANT
SYR CONTROL 10ML LL (SYRINGE) ×2 IMPLANT
TOWEL OR 17X24 6PK STRL BLUE (TOWEL DISPOSABLE) ×2 IMPLANT
TOWEL OR 17X26 10 PK STRL BLUE (TOWEL DISPOSABLE) ×2 IMPLANT
WATER STERILE IRR 1000ML POUR (IV SOLUTION) ×2 IMPLANT

## 2014-12-30 NOTE — Progress Notes (Addendum)
In Recovery room there is active plantar and dorsiflexion left foot, DF weakness is 5-/5 and plantarflex weakness 5-/5. c/o pain left leg and back.  I will reassess in am and have PT/OT see prior to discharge.

## 2014-12-30 NOTE — Brief Op Note (Addendum)
12/30/2014  9:31 PM  PATIENT:  [name] [name]  44 y.o. 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  PROCEDURE:  Procedure(s): LEFT L5-S1 Redo MICRODISCECTOMY (N/A)  SURGEON:  Surgeon(s) and Role: Jessy Oto, MD - Primary  PHYSICIAN ASSISTANT: CRNFA  ANESTHESIA:   local and general Dr. Therisa Doyne.  EBL:  Total I/O In: 2000 [I.V.:2000] Out: 100 [Blood:100]  BLOOD ADMINISTERED:none  DRAINS: none   LOCAL MEDICATIONS USED:  MARCAINE1/2% 1:1 EXPAREL 1.3%, Amount: 48ml  SPECIMEN:  No Specimen  DISPOSITION OF SPECIMEN:  N/A  COUNTS:  YES  TOURNIQUET:  * No tourniquets in log *  DICTATION: .Dragon Dictation  PLAN OF CARE: Admit to inpatient   PATIENT DISPOSITION:  PACU - hemodynamically stable.   Delay start of Pharmacological VTE agent (>24hrs) due to surgical blood loss or risk of bleeding: yes

## 2014-12-30 NOTE — Anesthesia Postprocedure Evaluation (Signed)
  Anesthesia Post-op Note  Patient: [name] [name]  Procedure(s) Performed: Procedure(s): LEFT L5-S1 Redo MICRODISCECTOMY (N/A)  Patient Location: PACU  Anesthesia Type:General  Level of Consciousness: awake and alert   Airway and Oxygen Therapy: Patient Spontanous Breathing  Post-op Pain: mild  Post-op Assessment: Post-op Vital signs reviewed, Patient's Cardiovascular Status Stable and Respiratory Function Stable  Post-op Vital Signs: Reviewed  Filed Vitals:   12/30/14 2230  BP: 97/51  Pulse: 64  Temp:   Resp: 13    Complications: No apparent anesthesia complications

## 2014-12-30 NOTE — Anesthesia Procedure Notes (Signed)
Procedure Name: Intubation Date/Time: 12/30/2014 5:40 PM Performed by: Shirlyn Goltz Pre-anesthesia Checklist: Patient identified, Emergency Drugs available, Suction available and Patient being monitored Patient Re-evaluated:Patient Re-evaluated prior to inductionOxygen Delivery Method: Circle system utilized Preoxygenation: Pre-oxygenation with 100% oxygen Intubation Type: IV induction Ventilation: Mask ventilation without difficulty Laryngoscope Size: Mac and 4 Grade View: Grade II Tube type: Oral Tube size: 7.0 mm Number of attempts: 1 Airway Equipment and Method: Stylet and LTA kit utilized Placement Confirmation: ETT inserted through vocal cords under direct vision,  positive ETCO2 and breath sounds checked- equal and bilateral Secured at: 22 cm Tube secured with: Tape Dental Injury: Teeth and Oropharynx as per pre-operative assessment

## 2014-12-30 NOTE — Discharge Instructions (Signed)
    No lifting greater than 10 lbs. Avoid bending, stooping and twisting. Walk in house for first week them may start to get out slowly increasing distance up to one mile by 3 weeks post op. Keep incision dry for 3 days, may use tegaderm or similar water impervious dressing.  

## 2014-12-30 NOTE — Interval H&P Note (Signed)
History and Physical Interval Note:  12/30/2014 5:34 PM  [name] [name]  has presented today for surgery, with the diagnosis of left L5-S1 recurrent herniated nucleus pulposus  The various methods of treatment have been discussed with the patient and family. After consideration of risks, benefits and other options for treatment, the patient has consented to  Procedure(s): LEFT L5-S1 Redo MICRODISCECTOMY (N/A) as a surgical intervention .  The patient's history has been reviewed, patient examined, no change in status, stable for surgery.  I have reviewed the patient's chart and labs.  Questions were answered to the patient's satisfaction.     [name] [name] E

## 2014-12-30 NOTE — Transfer of Care (Signed)
Immediate Anesthesia Transfer of Care Note  Patient: [name] [name]  Procedure(s) Performed: Procedure(s): LEFT L5-S1 Redo MICRODISCECTOMY (N/A)  Patient Location: PACU  Anesthesia Type:General  Level of Consciousness: awake, alert  and oriented  Airway & Oxygen Therapy: Patient connected to nasal cannula oxygen  Post-op Assessment: Report given to PACU RN, Post -op Vital signs reviewed and stable and Patient moving all extremities X 4  Post vital signs: Reviewed and stable  Complications: No apparent anesthesia complications
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
## 2014-12-30 NOTE — Op Note (Addendum)
12/30/2014  9:35 PM  PATIENT:  [name] [name]  44 y.o. female  MRN: [id number]  OPERATIVE REPORT  PRE-OPERATIVE DIAGNOSIS:  left L5-S1 recurrent herniated nucleus pulposus  POST-OPERATIVE DIAGNOSIS:  left L5-S1 recurrent herniated nucleus pulposus  PROCEDURE:  Procedure(s): LEFT L5-S1 Redo MICRODISCECTOMY    SURGEON:  Jessy Oto, MD     ASSISTANT:  CRNFA  (Present throughout the entire procedure and necessary for completion of procedure in a timely manner)     ANESTHESIA:  General,supplemented with local marcaine1/2% 1:1 exparel1.3%, Dr. Therisa Doyne.    COMPLICATIONS:  None.  FINDINGS: Extremely scarred left thecal sac, drilled left L5-S1 facet to 40% and the medialS1 pedicle, the left S1, S2 and S3 roots appeared compressed caudal to the upper aspect of the left S1 lamina. The left L5 nerve had a common origin from the left thecal sac and following the decompression and left L5-S1 discectomy the left L5 nerve root was erythrematous and swollen. Valsalva was negative for Dural leak.    DRAINS: Foley to SD.  PROCEDURE:The patient was met in the holding area, and the appropriate Left Lumbar level L5-S1 identified and marked with "x" and my initials.The patient was then transported to OR and was placed under general anesthesia without difficulty. The patient received appropriate preoperative antibiotic prophylaxis. The patient after intubation atraumatically was transferred to the operating room table, prone position, Jackson spine frame OR table. All pressure points were well padded. The arms in 90-90 well-padded at the elbows. Standard prep with DuraPrep solution lower dorsal spine to the mid sacral segment. Draped in the usual manner iodine Vi-Drape was used. Time-out procedure was called and correct. Skin about the previous incision scar was then infiltrated with Marcaine half percent 1:1 with Exparel 1.3%  total of 10 cc used. An incision approximately an inch inch and a half  in length was then made through skin and subcutaneous layers in line with the left side of the expected midline just superior to the spinal needle entry point. An incision made into the left lumbosacral fascia approximately an inch in length .   Smallest dilator was then introduced into the incision site and used to carefully form subperiosteal movement of the hip paralumbar muscles off of the posterior lamina of the expected L5-S1 level. Successive dilators were then carried up to the 11 mm size. The depth measured off of the dilators at about 90 mm and 70 mm retractors and placed on the scaffolding for the MIS equipment and guided over dilators down to and docking on the posterior aspect of the lamina at the expected L5-S1 level. This was sterilely attached to the articulating arm and it's up right which had been attached the OR table sterilely. Cross table lateral was identified the dilators and the retractors at the upper aspect of the level L5-S1. The operating room microscope sterilely draped brought into the field. Under the operating room microscope, the L5-S1 interspace carefully debrided the small amount of muscle attachment here and high-speed bur used to drill the medial aspect of the inferior articular process of L5 approximately 10%. A localization lateral C-arm view was obtained with Penfield 4 in the L5-S1 facet. 2 mm Kerrison then used to enter the spinal canal over the superior aspect of the S1 lamina carefully using the Kerrison to debris the attachment as a curet. Foraminotomy was then performed over theS1 nerve root. The medial 10% superior articular process of S1 and then resected using 2 mm Kerrison. The area of  previous laminotomy left L5-S1 was extremely scarred and the inferior lamina of L5 and the medial L5-S1 facet were drilled to allow for exposure of the left side L5-S1 disc above the S1 pedicle on the left side. The facet drilling required nearly 40% resection before encountering  the medial S1 pedicle.  The L5 and S1 nerve root were determined to be conjoined and the S1 root difficult to mobilize. Intra operative  Xray was repeated to determine the correct level and ensure exploration was directed to the L5-S1 disc and this was confirmed.with manipulation of the thecal sac the L5 nerve root appeared erythrematous and there was concern of a possible dural tear. That being the case the incision was extended caudally an additional inch and a half and electrocautery used to incise the muscles both sides of the spinous process of L5 and S1 to allow for placement of the boss McCollough retractor. The retractor was placed and visualization obtained. The L5 and S1 nerve root able to be evaluated and further laminotomy performed distally resecting superior aspect of the S1 lamina on the left side decompressing the left S1-S2 and S3 nerve roots further found to have entrapment. S1 nerve root was then carefully freed off the medial aspect of the S1 pedicle and mobilized medially over the posterior aspect of the herniated disc. Carefully with the lateral aspect of the S1 nerve root  identified and a Penfield 4 was used to mobilize the nerve medially such that the herniated disc was visible with microscope. Using a Penfield 4 for retraction and a 15 blade scalpel was used to incise the posterior longitudinal ligament within the lateral recess on the left side longitudinally. Disc material immediately extruded and this was removed using micropituitary rongeurs and nerve hook nerve root and then more easily able to be mobilized medially and retracted using a love retractor. Further foraminotomies was performed over the L5 nerve root the nerve root was noted to be X. without further compression. The nerve root able to be retracted along the medial aspect of the S1pedicle and disc material found to be subligamentous at this level was further resected current pituitary rongeurs. Ligamentum flavum was  further debrided superiorly to the level L5-S1 disc. Had a moderate amount of further resection of the S1 lamina inferiorly was performed. With this then the disc space at L5-S1 was easily visualized and entry into the disc at the sided disc herniation was possible using a Penfield 4 intraoperative.  Micropituitary was used to further debride this material superficially from the posterior aspect of the intervertebral disc is posterior lateral aspect of the disc. Small amount of further disc material was found subligamentous extending inferiorly from the disc this was removed using micropituitary rongeurs. Ligamentum flavum was debrided and lateral recess along the medial aspect L5-S1 facet no further decompression was necessary. Ball tip nerve probe was then able to carefully palpate the neuroforamen for L5and S1 finding these to be well decompressed. Bleeding was then controlled using thrombin-soaked Gelfoam small cottonoids.All Gelfoam  were then removed. Valsalva was carried out to 40 mmHg demonstrating no dural leak. Duraseal used to obtain hemostasis and provide definite layer of sealant over the left laminotomy area at L5 and S1.  Small amount of bleeding within the soft tissue mass the laminotomy area was controlled using bipolar electrocautery. Irrigation was carried out using copious amounts of irrigant solution.No significant active bleeding present at the time of removal. All instruments sponge counts were correct traction system was then carefully  removed carefully rotating retractors with this withdrawal and only bipolar electrocautery of any small bleeders. Lumbodorsal fascia was then carefully approximated with interrupted #1 Vicryl sutures,  deep subcutaneous layers were approximated with interrupted 0 Vicryl sutures, the deep fascial layers were then infiltrated with further local anesthetic up to a total of 25 mL exparel/Marcaine superficial layers approximated with interrupted 2-0 Vicryl sutures  and the skin closed with a running subcutaneous stitch of 4-0 Vicryl. Dermabond was applied allowed to dry and then Mepilex bandage applied. Patient was then carefully returned to supine position on a stretcher, reactivated and extubated. She was then returned to recovery room in satisfactory condition.  CRNFAs perform the duties of Pensions consultant during this case.  These nurses assisted with careful retraction of neural structures and performed closure of the incision from the fascia to the skin applying the dressing. Due to the prolonged length of the surgery a Foley catheter was inserted at the end of the case.     NITKA,JAMES E 12/30/2014, 9:35 PM

## 2014-12-30 NOTE — Anesthesia Preprocedure Evaluation (Addendum)
Anesthesia Evaluation  Patient identified by MRN, date of birth, ID band  Reviewed: Allergy & Precautions, NPO status , Patient's Chart, lab work & pertinent test results  Airway Mallampati: II  TM Distance: >3 FB Neck ROM: Full    Dental  (+) Dental Advisory Given, Teeth Intact   Pulmonary Current Smoker, former smoker,  PE 2009   Pulmonary exam normal       Cardiovascular     Neuro/Psych Pain in left "buttcheek"    GI/Hepatic GERD-  ,  Endo/Other    Renal/GU      Musculoskeletal   Abdominal   Peds  Hematology  (+) anemia ,   Anesthesia Other Findings Protein C Def  Reproductive/Obstetrics                            Anesthesia Physical Anesthesia Plan  ASA: II  Anesthesia Plan: General   Post-op Pain Management:    Induction: Intravenous  Airway Management Planned: Oral ETT  Additional Equipment:   Intra-op Plan:   Post-operative Plan: Extubation in OR  Informed Consent: I have reviewed the patients History and Physical, chart, labs and discussed the procedure including the risks, benefits and alternatives for the proposed anesthesia with the patient or authorized representative who has indicated his/her understanding and acceptance.     Plan Discussed with: CRNA, Anesthesiologist and Surgeon  Anesthesia Plan Comments:         Anesthesia Quick Evaluation

## 2014-12-31 DIAGNOSIS — M5126 Other intervertebral disc displacement, lumbar region: Secondary | ICD-10-CM | POA: Diagnosis present

## 2014-12-31 MED ORDER — PANTOPRAZOLE SODIUM 40 MG PO TBEC: 40.0000 mg | DELAYED_RELEASE_TABLET | Freq: Every day | ORAL | Status: DC

## 2014-12-31 MED ORDER — GABAPENTIN 300 MG PO CAPS: 300.0000 mg | ORAL_CAPSULE | Freq: Three times a day (TID) | ORAL | Status: DC

## 2014-12-31 NOTE — Evaluation (Signed)
Physical Therapy Evaluation Patient Details Name: [name] [name] MRN: [id number] DOB: 01/29/1971 Today's Date: 12/31/2014   History of Present Illness  This 44 year old female is status post left L5-S1 microdiscectomy nearly 1 year ago. She has had some persistent low back pain since time of surgery but over the last 4 weeks pain has become significantly worse with radiation down the left posterior thigh posterior calf and the lateral left foot. Pt s/p L5-S1 microdiscectomy  Clinical Impression  Pt pleasant but limited by pain and benefits from reassurance and encouragement to maximize mobility. Pt educated for all precautions, transfers and gait and is generally slow moving with all mobility. Pt will benefit from acute as well as HHPT to maximize function, gait and activity tolerance to decrease burden of care and increase adherence to precautions.     Follow Up Recommendations Home health PT    Equipment Recommendations  None recommended by PT    Recommendations for Other Services OT consult     Precautions / Restrictions Precautions Precautions: Back Precaution Booklet Issued: Yes (comment)      Mobility  Bed Mobility Overal bed mobility: Needs Assistance Bed Mobility: Rolling;Sidelying to Sit Rolling: Min assist Sidelying to sit: Mod assist       General bed mobility comments: cues for sequence with assist to elevate trunk and bring legs off of bed  Transfers Overall transfer level: Needs assistance   Transfers: Sit to/from Stand Sit to Stand: Min assist         General transfer comment: cues for hand placement, posture, sequence and precautions  Ambulation/Gait Ambulation/Gait assistance: Min guard Ambulation Distance (Feet): 60 Feet Assistive device: Rolling walker (2 wheeled) Gait Pattern/deviations: Step-to pattern;Decreased stride length;Decreased stance time - left;Narrow base of support   Gait velocity interpretation: Below normal speed for  age/gender General Gait Details: Pt with arms and back very stiff and tight throughout gait with cues for posture, sequence and breathing  Stairs            Wheelchair Mobility    Modified Rankin (Stroke Patients Only)       Balance Overall balance assessment: Needs assistance   Sitting balance-Leahy Scale: Good       Standing balance-Leahy Scale: Fair                               Pertinent Vitals/Pain Pain Assessment: 0-10 Pain Score: 8  Pain Location: left buttock Pain Descriptors / Indicators: Aching Pain Intervention(s): Repositioned;Patient requesting pain meds-RN notified    Home Living Family/patient expects to be discharged to:: Private residence Living Arrangements: Spouse/significant other;Children Available Help at Discharge: Family;Available 24 hours/day Type of Home: House Home Access: Stairs to enter   [employer] of Steps: 3 Home Layout: One level Home Equipment: Walker - 2 wheels;Bedside commode      Prior Function Level of Independence: Independent         Comments: Pt has been ambulating and working with pain as well as performing ADLs but when at home prefers to remain in bed     Hand Dominance        Extremity/Trunk Assessment   Upper Extremity Assessment: Overall WFL for tasks assessed           Lower Extremity Assessment: RLE deficits/detail;LLE deficits/detail RLE Deficits / Details: grossly 3/5 pt unable to resist currently secondary to pain LLE Deficits / Details: pt reports decreased sensation lateral leg from knee to toes, hip  flexion 2+/5, dorsiflexion 3+/5, did not assess knee flexion and extension resisted due to pt pain  Cervical / Trunk Assessment: Normal  Communication   Communication: No difficulties  Cognition Arousal/Alertness: Awake/alert Behavior During Therapy: WFL for tasks assessed/performed Overall Cognitive Status: Within Functional Limits for tasks assessed                       General Comments      Exercises        Assessment/Plan    PT Assessment Patient needs continued PT services  PT Diagnosis Difficulty walking;Generalized weakness;Acute pain   PT Problem List Decreased strength;Decreased activity tolerance;Decreased balance;Pain;Decreased knowledge of use of DME;Decreased mobility  PT Treatment Interventions Stair training;Gait training;DME instruction;Functional mobility training;Therapeutic activities;Therapeutic exercise;Patient/family education   PT Goals (Current goals can be found in the Care Plan section) Acute Rehab PT Goals Patient Stated Goal: be able to walk and work without pain PT Goal Formulation: With patient/family Time For Goal Achievement: 01/07/15 Potential to Achieve Goals: Good    Frequency Min 5X/week   Barriers to discharge        Co-evaluation               End of Session   Activity Tolerance: Patient limited by pain Patient left: in chair;with call bell/phone within reach;with nursing/sitter in room;with family/visitor present Nurse Communication: Mobility status;Precautions         Time: 0093-8182 PT Time Calculation (min) (ACUTE ONLY): 38 min   Charges:   PT Evaluation $Initial PT Evaluation Tier I: 1 Procedure PT Treatments $Gait Training: 8-22 mins $Therapeutic Activity: 8-22 mins   PT G CodesMelford Aase 12/31/2014, 11:11 AM Elwyn Reach, [place]

## 2014-12-31 NOTE — Progress Notes (Signed)
Patient ID: [name] [name], female   DOB: 1971/03/24, 44 y.o.   MRN: [id number] Subjective: 1 Day Post-Op Procedure(s) (LRB): LEFT L5-S1 Redo MICRODISCECTOMY (N/A) Awake, alert and oriented x 4. The leg feels like it is waking up. Some numbness left wrist and left mid proximal forearm into the left mid finger long and index.  Patient reports pain as moderate.    Objective:   VITALS:  Temp:  [97.5 F (36.4 C)-98.6 F (37 C)] 97.7 F (36.5 C) (01/16 0600) Pulse Rate:  [61-84] 65 (01/16 0600) Resp:  [9-18] 16 (01/16 0600) BP: (96-137)/(51-78) 105/62 mmHg (01/16 0634) SpO2:  [98 %-100 %] 100 % (01/16 0600) Weight:  [70.308 kg (155 lb)] 70.308 kg (155 lb) (01/15 1458)  Neurologically intact ABD soft Neurovascular intact Sensation intact distally Intact pulses distally Dorsiflexion/Plantar flexion intact Incision: no drainage Left foot DF is 5-/5.   LABS  Recent Labs  12/30/14 1444  HGB 14.0  WBC 5.6  PLT 298    Recent Labs  12/30/14 1444  NA 139  K 4.2  CL 104  CO2 25  BUN <5*  CREATININE 0.80  GLUCOSE 95   No results for input(s): LABPT, INR in the last 72 hours.   Assessment/Plan: 1 Day Post-Op Procedure(s) (LRB): LEFT L5-S1 Redo MICRODISCECTOMY (N/A)  Advance diet Up with therapy Discharge home with home health  Jeannie Mallinger E 12/31/2014, 11:31 AM

## 2014-12-31 NOTE — Care Management Note (Signed)
    Page 1 of 2   12/31/2014     2:45:40 PM CARE MANAGEMENT NOTE 12/31/2014  Patient:  [name] [name]   Account Number:  [id number]  Date Initiated:  12/31/2014  Documentation initiated by:  [facility]  Subjective/Objective Assessment:   SUP:JSRP L5-S1 Redo MICRODISCECTOMY (N/A)     Action/Plan:   discharge planning   Anticipated DC Date:  12/31/2014   Anticipated DC Plan:  [place]  CM consult      [facility] Choice  HOME HEALTH   Choice offered to / List presented to:  C-1 Patient   DME arranged  NA      DME agency  NA     [place] arranged  HH-2 PT      [place].   Status of service:  Completed, signed off Medicare Important Message given?   (If response is "NO", the following Medicare IM given date fields will be blank) Date Medicare IM given:   Medicare IM given by:   Date Additional Medicare IM given:   Additional Medicare IM given by:    Discharge Disposition:  [place]  Per UR Regulation:    If discussed at Long Length of Stay Meetings, dates discussed:    Comments:  12/31/14 14:40 CM spoke with pt to offer choice of home health agency.  Pt chooses [facility] to render HHPT.  No DME is needed.  Address and contact information verified with pt. Referral called to [facility] rep, [name] [name].  No other CM needs were communicated.  [name] [name], BSN, CM [phone].

## 2014-12-31 NOTE — Progress Notes (Signed)
Discharge instructions and prescriptions reviewed with patient. Patient denies questions or concerns at this time. IV removed with no complications. VS stable. Patient has rolling walker and BSC at home. Patient set up with advanced home health PT. Patient discharged via wheelchair with all personal belongings, prescriptions, and Discharge packet.

## 2014-12-31 NOTE — Evaluation (Signed)
Occupational Therapy Evaluation Patient Details Name: [name] [name] MRN: [id number] DOB: Dec 22, 1970 Today's Date: 12/31/2014    History of Present Illness This 44 year old female is status post left L5-S1 microdiscectomy nearly 1 year ago. She has had some persistent low back pain since time of surgery but over the last 4 weeks pain has become significantly worse with radiation down the left posterior thigh posterior calf and the lateral left foot. Pt s/p L5-S1 microdiscectomy   Clinical Impression   Pt s/p above. Pt independent with ADLs, PTA. Feel pt will benefit from acute OT to increase independence with BADLs and increase activity tolerance prior to d/c.     Follow Up Recommendations  No OT follow up;Supervision - Intermittent    Equipment Recommendations  None recommended by OT    Recommendations for Other Services       Precautions / Restrictions Precautions Precautions: Back Precaution Booklet Issued: No Precaution Comments: reviewed precautions Restrictions Weight Bearing Restrictions: No      Mobility Bed Mobility    General bed mobility comments: not assessed  Transfers Overall transfer level: Needs assistance Equipment used: Rolling walker (2 wheeled) Transfers: Sit to/from Stand Sit to Stand: Min guard         General transfer comment: cues for technique.       ADL Overall ADL's : Needs assistance/impaired     Grooming: Oral care;Set up;Supervision/safety;Standing Grooming Details (indicate cue type and reason): cues for precautions             Lower Body Dressing: Min guard;Sit to/from stand   Toilet Transfer: Min guard;Ambulation;RW (chair)       Tub/ Shower Transfer: Min guard;Ambulation;Rolling walker;Tub transfer   Functional mobility during ADLs: Min guard;Rolling walker General ADL Comments: Educated on LB dressing technique and pt able to cross legs over knees. Educated on tub transfer technique and recommended spouse be  with her for this. Educated on AE. Educated on what pt could use for toilet aide if hygiene is an issue. Educated on safety such as safe shoewear, use of bag on walker, rugs/items on floor.  Educated on use of cup for oral care and placement of grooming items to avoid breaking precautions. Educated on incorporating precautions into functional activities. Educated on options for tub transfer and positioning of 3 in 1 for tub transfer. Explained that moving is beneficial.     Vision                     Perception     Praxis      Pertinent Vitals/Pain Pain Assessment: 0-10 Pain Score:  (over 10) Pain Location: back Pain Intervention(s): Repositioned;Monitored during session     Hand Dominance Right   Extremity/Trunk Assessment Upper Extremity Assessment Upper Extremity Assessment: Overall WFL for tasks assessed   Lower Extremity Assessment Lower Extremity Assessment: Defer to PT evaluation LLE Deficits / Details: pt reports decreased sensation lateral leg from knee to toes, hip flexion 2+/5, dorsiflexion 3+/5, did not assess knee flexion and extension resisted due to pt pain   Cervical / Trunk Assessment Cervical / Trunk Assessment: Normal   Communication Communication Communication: No difficulties   Cognition Arousal/Alertness: Awake/alert Behavior During Therapy: WFL for tasks assessed/performed Overall Cognitive Status: Within Functional Limits for tasks assessed                     General Comments       Exercises       Shoulder Instructions  Home Living Family/patient expects to be discharged to:: Private residence Living Arrangements: Spouse/significant other;Children Available Help at Discharge: Family;Available 24 hours/day Type of Home: House Home Access: Stairs to enter [employer] of Steps: 3 Entrance Stairs-Rails: None Home Layout: One level     Bathroom Shower/Tub: Teacher, early years/pre:  Standard Bathroom Accessibility: Yes How Accessible: Accessible via walker Home Equipment: [place] - 2 wheels;Bedside commode;Hand held shower head          Prior Functioning/Environment Level of Independence: Independent        Comments: Pt has been ambulating and working with pain as well as performing ADLs but when at home prefers to remain in bed    OT Diagnosis: Acute pain   OT Problem List: Decreased strength;Decreased activity tolerance;Decreased knowledge of use of DME or AE;Decreased knowledge of precautions;Pain   OT Treatment/Interventions: Self-care/ADL training;DME and/or AE instruction;Therapeutic activities;Patient/family education;Balance training    OT Goals(Current goals can be found in the care plan section) Acute Rehab OT Goals Patient Stated Goal: not stated OT Goal Formulation: With patient Time For Goal Achievement: 01/07/15 Potential to Achieve Goals: Good ADL Goals Pt Will Perform Grooming: with modified independence;standing Pt Will Perform Lower Body Dressing: with modified independence;sit to/from stand;with caregiver independent in assisting Pt Will Transfer to Toilet: with modified independence;ambulating (3 in 1 over commode) Pt Will Perform Toileting - Clothing Manipulation and hygiene: with modified independence;sit to/from stand;with caregiver independent in assisting  OT Frequency: Min 2X/week   Barriers to D/C:            Co-evaluation              End of Session Equipment Utilized During Treatment: Gait belt;Rolling walker  Activity Tolerance: Patient tolerated treatment well Patient left: in chair;with call bell/phone within reach   Time: 1202-1229 OT Time Calculation (min): 27 min Charges:  OT General Charges $OT Visit: 1 Procedure OT Evaluation $Initial OT Evaluation Tier I: 1 Procedure OT Treatments $Self Care/Home Management : 8-22 mins G-Codes: OT G-codes **NOT FOR INPATIENT CLASS** Functional Assessment Tool Used:  clinical judgment Functional Limitation: Self care Self Care Current Status (A7681): At least 1 percent but less than 20 percent impaired, limited or restricted Self Care Goal Status (L5726): 0 percent impaired, limited or restricted  Benito Mccreedy OTR/L [phone] 12/31/2014, 1:14 PM

## 2015-01-02 MED FILL — Thrombin For Soln 20000 Unit: CUTANEOUS | Qty: 1 | Status: AC

## 2015-01-02 NOTE — Progress Notes (Signed)
Addendum to P.T. eval 01-16-2015   January 16, 2015 1110  PT G-Codes **NOT FOR INPATIENT CLASS**  Functional Assessment Tool Used clinical judgement  Functional Limitation Mobility: Walking and moving around  Mobility: Walking and Moving Around Current Status (S8546) CJ  Mobility: Walking and Moving Around Goal Status (E7035) CI  Elwyn Reach, [place]

## 2015-01-04 ENCOUNTER — Encounter (HOSPITAL_COMMUNITY): Payer: Self-pay | Admitting: Specialist
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
## 2015-11-20 ENCOUNTER — Other Ambulatory Visit: Payer: Self-pay | Admitting: Specialist

## 2015-11-20 DIAGNOSIS — G8929 Other chronic pain: Secondary | ICD-10-CM

## 2015-11-20 DIAGNOSIS — M545 Low back pain, unspecified: Secondary | ICD-10-CM

## 2015-12-05 ENCOUNTER — Ambulatory Visit
Admission: RE | Admit: 2015-12-05 | Discharge: 2015-12-05 | Disposition: A | Discharge status: Home / Self Care | Payer: No Typology Code available for payment source | Source: Ambulatory Visit | Attending: Specialist | Admitting: Specialist

## 2015-12-05 VITALS — BP 93/59 | HR 71

## 2015-12-05 DIAGNOSIS — M545 Low back pain, unspecified: Secondary | ICD-10-CM

## 2015-12-05 DIAGNOSIS — G8929 Other chronic pain: Secondary | ICD-10-CM

## 2015-12-05 DIAGNOSIS — M5126 Other intervertebral disc displacement, lumbar region: Secondary | ICD-10-CM

## 2015-12-05 MED ORDER — MEPERIDINE HCL 100 MG/ML IJ SOLN
25.0000 mg | Freq: Once | INTRAMUSCULAR | Status: AC
  Administered 2015-12-05: 25 mg via INTRAMUSCULAR

## 2015-12-05 MED ORDER — IOHEXOL 180 MG/ML  SOLN
15.0000 mL | Freq: Once | INTRAMUSCULAR | Status: AC | PRN
  Administered 2015-12-05: 15 mL via INTRATHECAL

## 2015-12-05 MED ORDER — ONDANSETRON HCL 4 MG/2ML IJ SOLN
4.0000 mg | Freq: Once | INTRAMUSCULAR | Status: AC
  Administered 2015-12-05: 4 mg via INTRAMUSCULAR

## 2015-12-05 MED ORDER — DIAZEPAM 5 MG PO TABS
10.0000 mg | ORAL_TABLET | Freq: Once | ORAL | Status: AC
  Administered 2015-12-05: 10 mg via ORAL

## 2015-12-05 NOTE — Discharge Instructions (Signed)

## 2016-01-05 ENCOUNTER — Encounter: Payer: Self-pay | Admitting: Vascular Surgery

## 2016-01-08 ENCOUNTER — Encounter: Payer: Self-pay | Admitting: Vascular Surgery

## 2016-01-16 ENCOUNTER — Ambulatory Visit (INDEPENDENT_AMBULATORY_CARE_PROVIDER_SITE_OTHER): Payer: 59 | Admitting: Vascular Surgery

## 2016-01-16 ENCOUNTER — Encounter: Payer: Self-pay | Admitting: Vascular Surgery

## 2016-01-16 VITALS — BP 113/81 | HR 86 | Temp 97.2°F | Resp 16 | Ht 66.0 in | Wt 164.7 lb

## 2016-01-16 DIAGNOSIS — M5137 Other intervertebral disc degeneration, lumbosacral region: Secondary | ICD-10-CM | POA: Diagnosis not present
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
## 2016-02-01 ENCOUNTER — Other Ambulatory Visit: Payer: Self-pay | Admitting: Specialist

## 2016-02-05 ENCOUNTER — Other Ambulatory Visit: Payer: Self-pay | Admitting: *Deleted

## 2016-02-08 ENCOUNTER — Encounter (HOSPITAL_COMMUNITY): Payer: Self-pay

## 2016-02-08 ENCOUNTER — Telehealth: Payer: Self-pay | Admitting: Vascular Surgery

## 2016-02-08 ENCOUNTER — Encounter (HOSPITAL_COMMUNITY)
Admission: RE | Admit: 2016-02-08 | Discharge: 2016-02-08 | Disposition: A | Discharge status: Home / Self Care | Payer: 59 | Source: Ambulatory Visit | Attending: Specialist | Admitting: Specialist

## 2016-02-08 DIAGNOSIS — Z01812 Encounter for preprocedural laboratory examination: Secondary | ICD-10-CM | POA: Insufficient documentation

## 2016-02-08 DIAGNOSIS — Z0183 Encounter for blood typing: Secondary | ICD-10-CM | POA: Insufficient documentation

## 2016-02-08 LAB — CBC
HEMATOCRIT: 42.1 % (ref 36.0–46.0)
Hemoglobin: 14.4 g/dL (ref 12.0–15.0)
MCH: 30.6 pg (ref 26.0–34.0)
MCHC: 34.2 g/dL (ref 30.0–36.0)
MCV: 89.4 fL (ref 78.0–100.0)
Platelets: 343 10*3/uL (ref 150–400)
RBC: 4.71 MIL/uL (ref 3.87–5.11)
RDW: 12.6 % (ref 11.5–15.5)
WBC: 5.5 10*3/uL (ref 4.0–10.5)

## 2016-02-08 LAB — COMPREHENSIVE METABOLIC PANEL
ALT: 20 U/L (ref 14–54)
ANION GAP: 9 (ref 5–15)
AST: 24 U/L (ref 15–41)
Albumin: 4 g/dL (ref 3.5–5.0)
Alkaline Phosphatase: 81 U/L (ref 38–126)
BILIRUBIN TOTAL: 0.4 mg/dL (ref 0.3–1.2)
BUN: 5 mg/dL — AB (ref 6–20)
CO2: 27 mmol/L (ref 22–32)
Calcium: 9.6 mg/dL (ref 8.9–10.3)
Chloride: 103 mmol/L (ref 101–111)
Creatinine, Ser: 0.82 mg/dL (ref 0.44–1.00)
GFR calc Af Amer: >60 mL/min (ref >60)
Glucose, Bld: 108 mg/dL — ABNORMAL HIGH (ref 65–99)
POTASSIUM: 4.1 mmol/L (ref 3.5–5.1)
Sodium: 139 mmol/L (ref 135–145)
TOTAL PROTEIN: 7.4 g/dL (ref 6.5–8.1)

## 2016-02-08 LAB — PROTIME-INR
INR: 0.95 (ref 0.00–1.49)
Prothrombin Time: 12.9 seconds (ref 11.6–15.2)

## 2016-02-08 LAB — URINALYSIS, ROUTINE W REFLEX MICROSCOPIC
Bilirubin Urine: NEGATIVE
Glucose, UA: NEGATIVE mg/dL
Ketones, ur: NEGATIVE mg/dL
Nitrite: NEGATIVE
Protein, ur: NEGATIVE mg/dL
SPECIFIC GRAVITY, URINE: 1.013 (ref 1.005–1.030)
pH: 6 (ref 5.0–8.0)

## 2016-02-08 LAB — APTT: aPTT: 29 seconds (ref 24–37)

## 2016-02-08 LAB — URINE MICROSCOPIC-ADD ON

## 2016-02-08 LAB — TYPE AND SCREEN
ABO/RH(D): O POS
Antibody Screen: NEGATIVE

## 2016-02-08 LAB — ABO/RH: ABO/RH(D): O POS

## 2016-02-08 LAB — SURGICAL PCR SCREEN
MRSA, PCR: NEGATIVE
Staphylococcus aureus: NEGATIVE

## 2016-02-08 NOTE — Pre-Procedure Instructions (Signed)
[name] [name]  02/08/2016      [id number] [place], [place] - [id number] [place] [address] Phone: [phone] Fax: [phone]    Your procedure is scheduled on Monday February 27th .   Report to [facility] Admitting at 530 A.M.  Call this number if you have problems the morning of surgery:  [phone]   Remember:  Do not eat food or drink liquids after midnight Sunday Feb 26.  Take these medicines the morning of surgery with A SIP OF WATER gabapentin (neurontin) if needed, oxycodone if needed  STOP: ALL Vitamins, Supplements, Effient and Herbal Medications, Fish Oils, Aspirins, NSAIDs (Nonsteroidal Anti-inflammatories such as Ibuprofen, Aleve, or Advil), and Goody's/BC Powders 7 days prior to surgery, until after surgery as directed by your physician.    Do not wear jewelry, make-up or nail polish.  Do not wear lotions, powders, or perfumes.  You may wear deodorant.  Do not shave 48 hours prior to surgery.    Do not bring valuables to the hospital.  [facility] is not responsible for any belongings or valuables.  Contacts, dentures or bridgework may not be worn into surgery.  Leave your suitcase in the car.  After surgery it may be brought to your room.  For patients admitted to the hospital, discharge time will be determined by your treatment team.  Patients discharged the day of surgery will not be allowed to drive home.        Preparing for Surgery at [facility]  Before surgery, you can play an important role.  Because skin is not sterile, your skin needs to be as free of germs as possible.  You can reduce the number of germs on your skin by washing with CHG (chlorahexidine gluconate) Soap before surgery.  CHG is an antiseptic cleaner with kills germs and bonds with the skin to continue killing germs even after washing.   Please do not use if you have an allergy to CHG or  antibacterial soaps.  If your skin becomes reddened/irritated stop using the CHG.  Do not shave (including legs and underarms) for at least 48 hours prior to first CHG shower.  It is okay to shave your face.  Please follow these instructions carefully:  1. Shower with CHG Soap the night before surgery and the morning of Surgery. 2. If you choose to wash your hair, wash your hair first as usual with your normal shampoo. 3. After you shampoo, rinse your hair and body thoroughly to remove the Shampoo. 4. Use CHG as you would any other liquid soap. You can apply chg directly to the skin and wash gently with scrungie or a clean washcloth. 5. Apply the CHG Soap to your body ONLY FROM THE NECK DOWN. Do not use on open wounds or open sores. Avoid contact with your eyes, ears, mouth and genitals (private parts). Wash genitals (private parts) with your normal soap. 6. Wash thoroughly, paying special attention to the area where your surgery will be performed. 7. Thoroughly rinse your body with warm water from the neck down. 8. DO NOT shower/wash with your normal soap after using and rinsing off the CHG Soap. 9. Pat yourself dry with a clean towel.  10. Wear clean pajamas.  11. Place clean sheets on your bed the night of your first shower and do not sleep with pets.  Day of Surgery  Do not apply any lotions/deodorants  the morning of surgery. Please wear clean clothes to the hospital/surgery center.   Please read over the following fact sheets that you were given. Pain Booklet, Coughing and Deep Breathing, Blood Transfusion Information, MRSA Information and Surgical Site Infection Prevention

## 2016-02-08 NOTE — Telephone Encounter (Signed)
Selinda Eon is reaching out to Mrs. [name] [name] regarding patient; she states she is returning call; and needs to know if she needs to cnc request please call number [phone] ext [phone]. Thanks

## 2016-02-12 ENCOUNTER — Inpatient Hospital Stay (HOSPITAL_COMMUNITY): Admission: RE | Admit: 2016-02-12 | Payer: 59 | Source: Ambulatory Visit | Admitting: Specialist

## 2016-02-12 ENCOUNTER — Encounter (HOSPITAL_COMMUNITY): Admission: RE | Payer: Self-pay | Source: Ambulatory Visit

## 2016-02-12 SURGERY — ANTERIOR LUMBAR FUSION 1 LEVEL
Anesthesia: General

## 2016-06-10 ENCOUNTER — Emergency Department (HOSPITAL_COMMUNITY)
Admission: EM | Admit: 2016-06-10 | Discharge: 2016-06-11 | Disposition: A | Discharge status: Home / Self Care | Payer: Non-veteran care | Attending: Emergency Medicine | Admitting: Emergency Medicine

## 2016-06-10 ENCOUNTER — Encounter (HOSPITAL_COMMUNITY): Payer: Self-pay | Admitting: Emergency Medicine

## 2016-06-10 DIAGNOSIS — F17210 Nicotine dependence, cigarettes, uncomplicated: Secondary | ICD-10-CM | POA: Insufficient documentation

## 2016-06-10 DIAGNOSIS — M545 Low back pain: Secondary | ICD-10-CM | POA: Diagnosis not present

## 2016-06-10 DIAGNOSIS — R200 Anesthesia of skin: Secondary | ICD-10-CM | POA: Diagnosis not present

## 2016-06-10 DIAGNOSIS — M549 Dorsalgia, unspecified: Secondary | ICD-10-CM

## 2016-06-10 NOTE — ED Notes (Signed)
Pt presents to the ED for assessment of right lower back pain radiating down to foot.  Pt sts she has a hx of chronic back pain, but she was grocery shopping yesterday and "felt a jolt" and began to have sudden right lower back pain.  Pt sts her chronic pain is usually on the left side.  Pt sts she called her back surgeon, Dr. Louanne Skye, and they told her to come to the ER for an MRI.

## 2016-06-11 ENCOUNTER — Emergency Department (HOSPITAL_COMMUNITY): Payer: Non-veteran care

## 2016-06-11 LAB — CBC WITH DIFFERENTIAL/PLATELET
Basophils Absolute: 0.1 K/uL (ref 0.0–0.1)
Basophils Relative: 1 %
Eosinophils Absolute: 0.1 K/uL (ref 0.0–0.7)
Eosinophils Relative: 2 %
HCT: 43.5 % (ref 36.0–46.0)
Hemoglobin: 15 g/dL (ref 12.0–15.0)
Lymphocytes Relative: 42 %
Lymphs Abs: 3.1 K/uL (ref 0.7–4.0)
MCH: 30.9 pg (ref 26.0–34.0)
MCHC: 34.5 g/dL (ref 30.0–36.0)
MCV: 89.5 fL (ref 78.0–100.0)
Monocytes Absolute: 0.7 K/uL (ref 0.1–1.0)
Monocytes Relative: 10 %
Neutro Abs: 3.4 K/uL (ref 1.7–7.7)
Neutrophils Relative %: 45 %
Platelets: 334 K/uL (ref 150–400)
RBC: 4.86 MIL/uL (ref 3.87–5.11)
RDW: 12.8 % (ref 11.5–15.5)
WBC: 7.5 K/uL (ref 4.0–10.5)

## 2016-06-11 LAB — SEDIMENTATION RATE: Sed Rate: 6 mm/hr (ref 0–22)

## 2016-06-11 LAB — BASIC METABOLIC PANEL WITH GFR
Anion gap: 6 (ref 5–15)
BUN: 7 mg/dL (ref 6–20)
CO2: 31 mmol/L (ref 22–32)
Calcium: 9.4 mg/dL (ref 8.9–10.3)
Chloride: 102 mmol/L (ref 101–111)
Creatinine, Ser: 0.96 mg/dL (ref 0.44–1.00)
GFR calc Af Amer: >60 mL/min
GFR calc non Af Amer: >60 mL/min
Glucose, Bld: 62 mg/dL — ABNORMAL LOW (ref 65–99)
Potassium: 4.8 mmol/L (ref 3.5–5.1)
Sodium: 139 mmol/L (ref 135–145)

## 2016-06-11 LAB — MAGNESIUM: Magnesium: 1.9 mg/dL (ref 1.7–2.4)

## 2016-06-11 LAB — C-REACTIVE PROTEIN: CRP: 0.7 mg/dL

## 2016-06-11 MED ORDER — MORPHINE SULFATE (PF) 4 MG/ML IV SOLN
4.0000 mg | Freq: Once | INTRAVENOUS | Status: AC
  Administered 2016-06-11: 4 mg via INTRAVENOUS
  Filled 2016-06-11: qty 1

## 2016-06-11 MED ORDER — PREDNISONE 20 MG PO TABS: ORAL_TABLET | ORAL | Status: DC

## 2016-06-11 MED ORDER — KETOROLAC TROMETHAMINE 30 MG/ML IJ SOLN
30.0000 mg | Freq: Once | INTRAMUSCULAR | Status: AC
  Administered 2016-06-11: 30 mg via INTRAVENOUS
  Filled 2016-06-11: qty 1

## 2016-06-11 MED ORDER — METHYLPREDNISOLONE SODIUM SUCC 125 MG IJ SOLR
125.0000 mg | Freq: Once | INTRAMUSCULAR | Status: AC
  Administered 2016-06-11: 125 mg via INTRAVENOUS
  Filled 2016-06-11: qty 2

## 2016-06-11 MED ORDER — FENTANYL CITRATE (PF) 100 MCG/2ML IJ SOLN
100.0000 ug | Freq: Once | INTRAMUSCULAR | Status: AC
  Administered 2016-06-11: 100 ug via INTRAVENOUS
  Filled 2016-06-11: qty 2

## 2016-06-11 MED ORDER — SODIUM CHLORIDE 0.9 % IV SOLN
INTRAVENOUS | Status: DC
  Administered 2016-06-11: 08:00:00 via INTRAVENOUS

## 2016-06-11 MED ORDER — METHOCARBAMOL 500 MG PO TABS: 500.0000 mg | ORAL_TABLET | Freq: Two times a day (BID) | ORAL | Status: DC

## 2016-06-11 MED ORDER — DEXTROSE 5 % IV SOLN
500.0000 mg | Freq: Once | INTRAVENOUS | Status: AC
  Administered 2016-06-11: 500 mg via INTRAVENOUS
  Filled 2016-06-11: qty 5

## 2016-06-11 MED ORDER — FENTANYL CITRATE (PF) 100 MCG/2ML IJ SOLN
50.0000 ug | Freq: Once | INTRAMUSCULAR | Status: AC
  Administered 2016-06-11: 50 ug via INTRAVENOUS
  Filled 2016-06-11: qty 2

## 2016-06-11 MED ORDER — METHOCARBAMOL 1000 MG/10ML IJ SOLN
500.0000 mg | Freq: Once | INTRAMUSCULAR | Status: DC
  Filled 2016-06-11: qty 5

## 2016-06-11 MED ORDER — HYDROMORPHONE HCL 1 MG/ML IJ SOLN
1.0000 mg | Freq: Once | INTRAMUSCULAR | Status: DC
  Filled 2016-06-11: qty 1

## 2016-06-11 MED ORDER — HYDROMORPHONE HCL 1 MG/ML IJ SOLN
0.5000 mg | Freq: Once | INTRAMUSCULAR | Status: AC
  Administered 2016-06-11: 0.5 mg via INTRAVENOUS
  Filled 2016-06-11: qty 1

## 2016-06-11 MED ORDER — SODIUM CHLORIDE 0.9 % IV BOLUS (SEPSIS)
1000.0000 mL | Freq: Once | INTRAVENOUS | Status: AC
  Administered 2016-06-11: 1000 mL via INTRAVENOUS

## 2016-06-11 MED ORDER — GADOBENATE DIMEGLUMINE 529 MG/ML IV SOLN
15.0000 mL | Freq: Once | INTRAVENOUS | Status: AC | PRN
  Administered 2016-06-11: 15 mL via INTRAVENOUS

## 2016-06-11 NOTE — ED Notes (Signed)
Pt able to ambulate w/ walker in room.
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
## 2016-06-11 NOTE — ED Notes (Signed)
Dr Leonides Schanz notified of BP 93/64 prior to dilaudid admin and she is to change the order.

## 2016-06-11 NOTE — Discharge Instructions (Signed)
Take the prescribed medication as directed.  You may take these with your home percocet. Follow-up with Dr. Louanne Skye tomorrow in clinic as scheduled at 9:30am. Return to the ED for new or worsening symptoms.

## 2016-06-11 NOTE — ED Provider Notes (Signed)
Care assumed from Dr. Leonides Schanz at shift change.  See her note for full H&P.  Briefly, [name] scheduled for lumbar fusion next week here with right sided back pain, right leg numbness and weakness.  Called office earlier, told to come here for MRI which has been done.  MRI shows post-surgical changes as well as 7 x 12 enhancing nodule at left facet joint-- question of granulation tissue vs early abscess vs synovial cyst.  Also noted L5 enhancement compatible with neuritis.  Discogenic endplate changes at L5- S1-- discitis vs osteomyelitis.  Patient afebrile with normal white count.  Normal ESR and CRP.  Plan:  Patient has been unable to stand here in ED.  Have tried to contact her surgeon, Dr. Louanne Skye, however does not come into office until 8am.  Will try to re-page then as patient will need admission for pain control.  Results for orders placed or performed during the hospital encounter of 06/10/16  CBC with Differential  Result Value Ref Range   WBC 7.5 4.0 - 10.5 K/uL   RBC 4.86 3.87 - 5.11 MIL/uL   Hemoglobin 15.0 12.0 - 15.0 g/dL   HCT 43.5 36.0 - 46.0 %   MCV 89.5 78.0 - 100.0 fL   MCH 30.9 26.0 - 34.0 pg   MCHC 34.5 30.0 - 36.0 g/dL   RDW 12.8 11.5 - 15.5 %   Platelets 334 150 - 400 K/uL   Neutrophils Relative % 45 %   Neutro Abs 3.4 1.7 - 7.7 K/uL   Lymphocytes Relative 42 %   Lymphs Abs 3.1 0.7 - 4.0 K/uL   Monocytes Relative 10 %   Monocytes Absolute 0.7 0.1 - 1.0 K/uL   Eosinophils Relative 2 %   Eosinophils Absolute 0.1 0.0 - 0.7 K/uL   Basophils Relative 1 %   Basophils Absolute 0.1 0.0 - 0.1 K/uL  Basic metabolic panel  Result Value Ref Range   Sodium 139 135 - 145 mmol/L   Potassium 4.8 3.5 - 5.1 mmol/L   Chloride 102 101 - 111 mmol/L   CO2 31 22 - 32 mmol/L   Glucose, Bld 62 (L) 65 - 99 mg/dL   BUN 7 6 - 20 mg/dL   Creatinine, Ser 0.96 0.44 - 1.00 mg/dL   Calcium 9.4 8.9 - 10.3 mg/dL   GFR calc non Af Amer >60 >60 mL/min   GFR calc Af Amer >60  >60 mL/min   Anion gap 6 5 - 15  Magnesium  Result Value Ref Range   Magnesium 1.9 1.7 - 2.4 mg/dL  Sedimentation rate  Result Value Ref Range   Sed Rate 6 0 - 22 mm/hr  C-reactive protein  Result Value Ref Range   CRP 0.7 <1.0 mg/dL   Mr Lumbar Spine W Wo Contrast  06/11/2016  CLINICAL DATA:  Gradually worsening chronic low back pain, severe RIGHT back pain radiating to RIGHT lower extremity. History of lumbar surgeries, last December 2016. History of pulmonary embolism, Protein CNS deficiency. EXAM: MRI LUMBAR SPINE WITHOUT CONTRAST TECHNIQUE: Multiplanar, multisequence MR imaging of the lumbar spine was performed. No intravenous contrast was administered. COMPARISON:  MRI of the lumbar spine December 29, 2014 FINDINGS: SEGMENTATION: For the purposes of this report, the last well-formed intervertebral disc will be described as L5-S1. ALIGNMENT: No malalignment.  Maintenance of the lumbar lordosis. VERTEBRAE:Lumbar vertebral bodies are intact. Status post LEFT L5 laminectomy. Moderate to severe L5-S1 disc height loss, progressed from prior examination. Decreased T2  signal within the L5-S1 disc, to lesser extent L4-5 compatible with desiccation. Low T1, bright STIR, mildly enhancing signal without the L5 and S1 vertebra consistent with acute discogenic endplate changes. CONUS MEDULLARIS: Conus medullaris terminates at L1-2 and demonstrates normal morphology and signal characteristics. Cauda equina is normal. PARASPINAL AND SOFT TISSUES: Included prevertebral and paraspinal soft tissues are normal. DISC LEVELS: T12-L1 through L4-5: No disc bulge, canal stenosis nor neural foraminal narrowing. L5-S1: Status post LEFT hemilaminectomy. Within the LEFT lateral recess, contiguous with the medial margin LEFT facet joint is a 7 x 12 millimeter low T1, bright T2 enhancing signal nodule contacting the traversing LEFT S1 nerve. Small ventral disc extrusion. Enhancing exiting LEFT L5 nerve. Faint enhancement LEFT  annulus. No canal stenosis. Mild RIGHT, moderate LEFT neural foraminal narrowing. IMPRESSION: Status post LEFT L5 hemilaminectomy. 7 x 12 mm enhancing nodule contiguous with the LEFT facet joint, differential diagnosis includes granulation tissue, less likely phlegmon/early abscess or synovial cyst. Enhancing LEFT L5 nerve compatible with neuritis. Acute discogenic endplate changes S5-K5, less likely early discitis/osteomyelitis. Recommend correlation with ESR/CRP. Electronically Signed   By: Elon Alas M.D.   On: 06/11/2016 05:17     9:18 AM Dr. Louanne Skye in [place]-- case discussed with his assistant and scrub RN who has relayed message to him including MRI results, labs, etc.  Dr. Louanne Skye agrees that without fever or leukocytosis this is unlikely to be abscess, discitis, or osteomyelitis.  He feels this is largely her chronic pain.  Feels she can be discharged with pain meds, steroids, etc and follow-up in clinic later this week.  This was discussed with patient-- she feels this is manageable from her standpoint.  States she has actually has follow-up appt scheduled for tomorrow morning at 0930 with Dr. Louanne Skye.  Patient given dose of solu-medrol here.  Will attempt to ambulate.  10:19 AM Patient has been able to ambulate in the room with assistance of walker.  Will d/c home with prednisone taper and robaxin.  Continue home percocet.  Follow-up with Dr. Louanne Skye in clinic tomorrow.  Discussed plan with patient, he/she acknowledged understanding and agreed with plan of care.  Return precautions given for new or worsening symptoms.  [name] Pickett, PA-C 06/11/16 1100

## 2016-06-11 NOTE — ED Notes (Signed)
Patient asleep at this time. Will reevaluate pain when patient wakes up/give medications.

## 2016-06-11 NOTE — ED Notes (Signed)
Transport here to take patient back to MRI, Pt requested some more pain medicine, Dr Leonides Schanz notified and to placed order for morphine.

## 2016-08-09 ENCOUNTER — Other Ambulatory Visit: Payer: Self-pay

## 2016-08-20 ENCOUNTER — Encounter (HOSPITAL_COMMUNITY): Payer: Self-pay

## 2016-08-20 ENCOUNTER — Encounter (HOSPITAL_COMMUNITY)
Admission: RE | Admit: 2016-08-20 | Discharge: 2016-08-20 | Disposition: A | Discharge status: Home / Self Care | Payer: Non-veteran care | Source: Ambulatory Visit | Attending: Specialist | Admitting: Specialist

## 2016-08-20 ENCOUNTER — Encounter (HOSPITAL_COMMUNITY)
Admission: RE | Admit: 2016-08-20 | Discharge: 2016-08-20 | Disposition: A | Discharge status: Home / Self Care | Payer: Non-veteran care | Source: Ambulatory Visit | Attending: Surgery | Admitting: Surgery

## 2016-08-20 DIAGNOSIS — Z01818 Encounter for other preprocedural examination: Secondary | ICD-10-CM | POA: Diagnosis present

## 2016-08-20 DIAGNOSIS — Z0183 Encounter for blood typing: Secondary | ICD-10-CM | POA: Diagnosis not present

## 2016-08-20 DIAGNOSIS — Z01812 Encounter for preprocedural laboratory examination: Secondary | ICD-10-CM | POA: Diagnosis not present

## 2016-08-20 DIAGNOSIS — M4806 Spinal stenosis, lumbar region: Secondary | ICD-10-CM | POA: Insufficient documentation

## 2016-08-20 HISTORY — DX: Dorsalgia, unspecified: M54.9

## 2016-08-20 HISTORY — DX: Personal history of pulmonary embolism: Z86.711

## 2016-08-20 HISTORY — DX: Pneumonia, unspecified organism: J18.9

## 2016-08-20 HISTORY — DX: Urgency of urination: R39.15

## 2016-08-20 HISTORY — DX: Other chronic pain: G89.29

## 2016-08-20 HISTORY — DX: Weakness: R53.1

## 2016-08-20 LAB — COMPREHENSIVE METABOLIC PANEL
ALK PHOS: 88 U/L (ref 38–126)
ALT: 17 U/L (ref 14–54)
ANION GAP: 5 (ref 5–15)
AST: 22 U/L (ref 15–41)
Albumin: 3.8 g/dL (ref 3.5–5.0)
BILIRUBIN TOTAL: 0.4 mg/dL (ref 0.3–1.2)
BUN: 8 mg/dL (ref 6–20)
CALCIUM: 9.2 mg/dL (ref 8.9–10.3)
CO2: 26 mmol/L (ref 22–32)
CREATININE: 0.77 mg/dL (ref 0.44–1.00)
Chloride: 106 mmol/L (ref 101–111)
Glucose, Bld: 101 mg/dL — ABNORMAL HIGH (ref 65–99)
Potassium: 4.4 mmol/L (ref 3.5–5.1)
SODIUM: 137 mmol/L (ref 135–145)
TOTAL PROTEIN: 6.8 g/dL (ref 6.5–8.1)

## 2016-08-20 LAB — APTT: aPTT: 29 seconds (ref 24–36)

## 2016-08-20 LAB — URINE MICROSCOPIC-ADD ON

## 2016-08-20 LAB — URINALYSIS, ROUTINE W REFLEX MICROSCOPIC
BILIRUBIN URINE: NEGATIVE
GLUCOSE, UA: NEGATIVE mg/dL
KETONES UR: NEGATIVE mg/dL
Leukocytes, UA: NEGATIVE
Nitrite: NEGATIVE
PROTEIN: NEGATIVE mg/dL
Specific Gravity, Urine: 1.018 (ref 1.005–1.030)
pH: 5.5 (ref 5.0–8.0)

## 2016-08-20 LAB — TYPE AND SCREEN
ABO/RH(D): O POS
Antibody Screen: NEGATIVE

## 2016-08-20 LAB — CBC
HCT: 43.9 % (ref 36.0–46.0)
HEMOGLOBIN: 14.6 g/dL (ref 12.0–15.0)
MCH: 30.8 pg (ref 26.0–34.0)
MCHC: 33.3 g/dL (ref 30.0–36.0)
MCV: 92.6 fL (ref 78.0–100.0)
Platelets: 376 10*3/uL (ref 150–400)
RBC: 4.74 MIL/uL (ref 3.87–5.11)
RDW: 13.2 % (ref 11.5–15.5)
WBC: 4.8 10*3/uL (ref 4.0–10.5)

## 2016-08-20 LAB — SURGICAL PCR SCREEN
MRSA, PCR: NEGATIVE
STAPHYLOCOCCUS AUREUS: NEGATIVE

## 2016-08-20 LAB — PROTIME-INR
INR: 0.96
PROTHROMBIN TIME: 12.8 s (ref 11.4–15.2)

## 2016-08-20 MED ORDER — CHLORHEXIDINE GLUCONATE 4 % EX LIQD: 60.0000 mL | Freq: Once | CUTANEOUS | Status: DC

## 2016-08-20 NOTE — Progress Notes (Signed)
Pt states she can't remember if she gets an injection of Lovenox or Heparin prior to surgery b/c of her history of PE's.Called and spoke with Sherrie at Dr.Nitka's to let her know pt is questioning if she will be receiving this.Sherrie will talk with [facility]

## 2016-08-20 NOTE — Pre-Procedure Instructions (Signed)
[name] [name]  08/20/2016      [id number] [address], [place] [address] [place] [address] Phone: [phone] Fax: [phone]    Your procedure is scheduled on Mon, Sept 11 @ 7:30 AM  Report to [facility] Admitting at 5:30 AM  Call this number if you have problems the morning of surgery:  [phone]   Remember:  Do not eat food or drink liquids after midnight.  Take these medicines the morning of surgery with A SIP OF WATER Pain Pill(if needed)              No Goody's,BC's,Aleve,Aspirin,Ibuprofen,Advil,Fish Oil,or any Herbal Medications.   Do not wear jewelry, make-up or nail polish.  Do not wear lotions, powders, or perfumes, or deoderant.  Do not shave 48 hours prior to surgery.     Do not bring valuables to the hospital.  [facility] is not responsible for any belongings or valuables.  Contacts, dentures or bridgework may not be worn into surgery.  Leave your suitcase in the car.  After surgery it may be brought to your room.  For patients admitted to the hospital, discharge time will be determined by your treatment team.  Patients discharged the day of surgery will not be allowed to drive home.   [facility]  Before surgery, you can play an important role.  Because skin is not sterile, your skin needs to be as free of germs as possible.  You can reduce the number of germs on you skin by washing with CHG (chlorahexidine gluconate) soap before surgery.  CHG is an antiseptic cleaner which kills germs and bonds with the skin to continue killing germs even after washing.  Please DO NOT use if you have an allergy to CHG or antibacterial soaps.  If your skin becomes reddened/irritated stop using the CHG and inform your nurse when you arrive at Short Stay.  Do not shave (including legs and underarms) for at least 48 hours prior to the first CHG  shower.  You may shave your face.  Please follow these instructions carefully:   1.  Shower with CHG Soap the night before surgery and the                                morning of Surgery.  2.  If you choose to wash your hair, wash your hair first as usual with your       normal shampoo.  3.  After you shampoo, rinse your hair and body thoroughly to remove the                      Shampoo.  4.  Use CHG as you would any other liquid soap.  You can apply chg directly       to the skin and wash gently with scrungie or a clean washcloth.  5.  Apply the CHG Soap to your body ONLY FROM THE NECK DOWN.        Do not use on open wounds or open sores.  Avoid contact with your eyes,       ears, mouth and genitals (private parts).  Wash genitals (private parts)       with your normal soap.  6.  Wash thoroughly, paying special attention to the area where  your surgery        will be performed.  7.  Thoroughly rinse your body with warm water from the neck down.  8.  DO NOT shower/wash with your normal soap after using and rinsing off       the CHG Soap.  9.  Pat yourself dry with a clean towel.            10.  Wear clean pajamas.            11.  Place clean sheets on your bed the night of your first shower and do not        sleep with pets.  Day of Surgery  Do not apply any lotions/deoderants the morning of surgery.  Please wear clean clothes to the hospital/surgery center.    Please read over the following fact sheets that you were given. Pain Booklet and MRSA Information

## 2016-08-20 NOTE — Progress Notes (Addendum)
Cardiologist denies   Medical Md is with [facility] in April 2017  Echo denies  Stress test denies  Heart cath denies  EKG denies in past yr  CXR denies in past yr

## 2016-08-25 MED ORDER — CEFAZOLIN SODIUM-DEXTROSE 2-4 GM/100ML-% IV SOLN
2.0000 g | INTRAVENOUS | Status: AC
  Administered 2016-08-26: 2 g via INTRAVENOUS
  Filled 2016-08-25 (×2): qty 100

## 2016-08-26 ENCOUNTER — Inpatient Hospital Stay (HOSPITAL_COMMUNITY): Payer: Non-veteran care | Admitting: Certified Registered Nurse Anesthetist

## 2016-08-26 ENCOUNTER — Inpatient Hospital Stay (HOSPITAL_COMMUNITY): Payer: Non-veteran care

## 2016-08-26 ENCOUNTER — Inpatient Hospital Stay (HOSPITAL_COMMUNITY)
Admission: RE | Admit: 2016-08-26 | Discharge: 2016-08-30 | DRG: 455 | Disposition: A | Discharge status: Home Health | Payer: Non-veteran care | Source: Ambulatory Visit | Attending: Specialist | Admitting: Specialist

## 2016-08-26 ENCOUNTER — Inpatient Hospital Stay (HOSPITAL_COMMUNITY)
Admission: RE | Disposition: A | Discharge status: Home Health | Payer: Self-pay | Source: Ambulatory Visit | Attending: Specialist

## 2016-08-26 ENCOUNTER — Encounter (HOSPITAL_COMMUNITY): Payer: Self-pay | Admitting: Urology

## 2016-08-26 DIAGNOSIS — M51379 Other intervertebral disc degeneration, lumbosacral region without mention of lumbar back pain or lower extremity pain: Secondary | ICD-10-CM | POA: Diagnosis present

## 2016-08-26 DIAGNOSIS — Z86718 Personal history of other venous thrombosis and embolism: Secondary | ICD-10-CM | POA: Diagnosis not present

## 2016-08-26 DIAGNOSIS — M5137 Other intervertebral disc degeneration, lumbosacral region: Secondary | ICD-10-CM | POA: Diagnosis present

## 2016-08-26 DIAGNOSIS — F17210 Nicotine dependence, cigarettes, uncomplicated: Secondary | ICD-10-CM | POA: Diagnosis present

## 2016-08-26 DIAGNOSIS — J449 Chronic obstructive pulmonary disease, unspecified: Secondary | ICD-10-CM | POA: Diagnosis present

## 2016-08-26 DIAGNOSIS — Z79899 Other long term (current) drug therapy: Secondary | ICD-10-CM | POA: Diagnosis not present

## 2016-08-26 DIAGNOSIS — M5127 Other intervertebral disc displacement, lumbosacral region: Secondary | ICD-10-CM | POA: Diagnosis present

## 2016-08-26 DIAGNOSIS — M549 Dorsalgia, unspecified: Secondary | ICD-10-CM | POA: Diagnosis present

## 2016-08-26 DIAGNOSIS — Z86711 Personal history of pulmonary embolism: Secondary | ICD-10-CM | POA: Diagnosis not present

## 2016-08-26 DIAGNOSIS — Z419 Encounter for procedure for purposes other than remedying health state, unspecified: Secondary | ICD-10-CM

## 2016-08-26 DIAGNOSIS — M5126 Other intervertebral disc displacement, lumbar region: Secondary | ICD-10-CM | POA: Diagnosis present

## 2016-08-26 HISTORY — PX: ABDOMINAL EXPOSURE: SHX5708

## 2016-08-26 HISTORY — PX: ANTERIOR LUMBAR FUSION: SHX1170

## 2016-08-26 SURGERY — ANTERIOR LUMBAR FUSION 1 LEVEL
Anesthesia: General

## 2016-08-26 MED ORDER — HYDROCODONE-ACETAMINOPHEN 5-325 MG PO TABS
1.0000 | ORAL_TABLET | ORAL | Status: DC | PRN
  Administered 2016-08-26: 2 via ORAL
  Filled 2016-08-26 (×3): qty 2

## 2016-08-26 MED ORDER — PHENYLEPHRINE HCL 10 MG/ML IJ SOLN
INTRAMUSCULAR | Status: DC | PRN
  Administered 2016-08-26: 120 ug via INTRAVENOUS
  Administered 2016-08-26: 160 ug via INTRAVENOUS
  Administered 2016-08-26: 120 ug via INTRAVENOUS

## 2016-08-26 MED ORDER — SODIUM CHLORIDE 0.9% FLUSH: 3.0000 mL | INTRAVENOUS | Status: DC | PRN

## 2016-08-26 MED ORDER — THROMBIN 5000 UNITS EX SOLR
CUTANEOUS | Status: AC
  Filled 2016-08-26: qty 5000

## 2016-08-26 MED ORDER — ACETAMINOPHEN 650 MG RE SUPP: 650.0000 mg | RECTAL | Status: DC | PRN

## 2016-08-26 MED ORDER — ONDANSETRON HCL 4 MG/2ML IJ SOLN
INTRAMUSCULAR | Status: DC | PRN
  Administered 2016-08-26 (×2): 4 mg via INTRAVENOUS

## 2016-08-26 MED ORDER — MIDAZOLAM HCL 2 MG/2ML IJ SOLN
0.5000 mg | Freq: Once | INTRAMUSCULAR | Status: AC | PRN
  Administered 2016-08-26: 2 mg via INTRAVENOUS

## 2016-08-26 MED ORDER — OXYCODONE-ACETAMINOPHEN 5-325 MG PO TABS
1.0000 | ORAL_TABLET | ORAL | Status: DC | PRN
  Administered 2016-08-26: 2 via ORAL
  Filled 2016-08-26 (×2): qty 2

## 2016-08-26 MED ORDER — KETOROLAC TROMETHAMINE 30 MG/ML IJ SOLN
30.0000 mg | Freq: Once | INTRAMUSCULAR | Status: AC
  Administered 2016-08-26: 30 mg via INTRAVENOUS
  Filled 2016-08-26: qty 1

## 2016-08-26 MED ORDER — ARTIFICIAL TEARS OP OINT
TOPICAL_OINTMENT | OPHTHALMIC | Status: DC | PRN
  Administered 2016-08-26: 1 via OPHTHALMIC

## 2016-08-26 MED ORDER — KETAMINE HCL-SODIUM CHLORIDE 100-0.9 MG/10ML-% IV SOSY
PREFILLED_SYRINGE | INTRAVENOUS | Status: AC
  Filled 2016-08-26: qty 10

## 2016-08-26 MED ORDER — SODIUM CHLORIDE 0.9 % IV SOLN
INTRAVENOUS | Status: DC
  Administered 2016-08-26 – 2016-08-28 (×5): via INTRAVENOUS

## 2016-08-26 MED ORDER — BUPIVACAINE HCL (PF) 0.25 % IJ SOLN
INTRAMUSCULAR | Status: DC | PRN
  Administered 2016-08-26: 5 mL

## 2016-08-26 MED ORDER — GLYCOPYRROLATE 0.2 MG/ML IV SOSY
PREFILLED_SYRINGE | INTRAVENOUS | Status: AC
  Filled 2016-08-26: qty 3

## 2016-08-26 MED ORDER — METOPROLOL TARTARATE 1 MG/ML SYRINGE (5ML)
Status: DC | PRN
  Administered 2016-08-26 (×5): 1 mg via INTRAVENOUS

## 2016-08-26 MED ORDER — THROMBIN 20000 UNITS EX SOLR
CUTANEOUS | Status: AC
  Filled 2016-08-26: qty 20000

## 2016-08-26 MED ORDER — BUPIVACAINE HCL (PF) 0.25 % IJ SOLN
INTRAMUSCULAR | Status: AC
  Filled 2016-08-26: qty 30

## 2016-08-26 MED ORDER — CEFAZOLIN IN D5W 1 GM/50ML IV SOLN
1.0000 g | Freq: Three times a day (TID) | INTRAVENOUS | Status: AC
  Administered 2016-08-26 (×2): 1 g via INTRAVENOUS
  Filled 2016-08-26 (×2): qty 50

## 2016-08-26 MED ORDER — PROPOFOL 10 MG/ML IV BOLUS
INTRAVENOUS | Status: AC
  Filled 2016-08-26: qty 20

## 2016-08-26 MED ORDER — ONDANSETRON HCL 4 MG/2ML IJ SOLN
INTRAMUSCULAR | Status: AC
  Filled 2016-08-26: qty 2

## 2016-08-26 MED ORDER — ROCURONIUM BROMIDE 100 MG/10ML IV SOLN
INTRAVENOUS | Status: DC | PRN
  Administered 2016-08-26: 10 mg via INTRAVENOUS
  Administered 2016-08-26: 20 mg via INTRAVENOUS
  Administered 2016-08-26: 50 mg via INTRAVENOUS
  Administered 2016-08-26 (×2): 20 mg via INTRAVENOUS

## 2016-08-26 MED ORDER — SODIUM CHLORIDE 0.9 % IV SOLN: 250.0000 mL | INTRAVENOUS | Status: DC

## 2016-08-26 MED ORDER — BISACODYL 5 MG PO TBEC: 5.0000 mg | DELAYED_RELEASE_TABLET | Freq: Every day | ORAL | Status: DC | PRN

## 2016-08-26 MED ORDER — LACTATED RINGERS IV SOLN
INTRAVENOUS | Status: DC | PRN
  Administered 2016-08-26 (×3): via INTRAVENOUS

## 2016-08-26 MED ORDER — DOCUSATE SODIUM 100 MG PO CAPS
100.0000 mg | ORAL_CAPSULE | Freq: Two times a day (BID) | ORAL | Status: DC
  Administered 2016-08-26 – 2016-08-30 (×8): 100 mg via ORAL
  Filled 2016-08-26 (×8): qty 1

## 2016-08-26 MED ORDER — PHENOL 1.4 % MT LIQD: 1.0000 | OROMUCOSAL | Status: DC | PRN

## 2016-08-26 MED ORDER — SUGAMMADEX SODIUM 200 MG/2ML IV SOLN
INTRAVENOUS | Status: DC | PRN
  Administered 2016-08-26: 200 mg via INTRAVENOUS

## 2016-08-26 MED ORDER — PROPOFOL 10 MG/ML IV BOLUS
INTRAVENOUS | Status: DC | PRN
  Administered 2016-08-26: 150 mg via INTRAVENOUS
  Administered 2016-08-26: 50 mg via INTRAVENOUS

## 2016-08-26 MED ORDER — ACETAMINOPHEN 325 MG PO TABS: 650.0000 mg | ORAL_TABLET | ORAL | Status: DC | PRN

## 2016-08-26 MED ORDER — GABAPENTIN 300 MG PO CAPS
300.0000 mg | ORAL_CAPSULE | Freq: Two times a day (BID) | ORAL | Status: DC
  Administered 2016-08-26 – 2016-08-30 (×8): 300 mg via ORAL
  Filled 2016-08-26 (×8): qty 1

## 2016-08-26 MED ORDER — ARTIFICIAL TEARS OP OINT
TOPICAL_OINTMENT | OPHTHALMIC | Status: AC
  Filled 2016-08-26: qty 3.5

## 2016-08-26 MED ORDER — FENTANYL CITRATE (PF) 100 MCG/2ML IJ SOLN
INTRAMUSCULAR | Status: AC
  Filled 2016-08-26: qty 4

## 2016-08-26 MED ORDER — BUPIVACAINE LIPOSOME 1.3 % IJ SUSP
20.0000 mL | INTRAMUSCULAR | Status: AC
  Administered 2016-08-26: 5 mL
  Filled 2016-08-26: qty 20

## 2016-08-26 MED ORDER — MIDAZOLAM HCL 2 MG/2ML IJ SOLN
INTRAMUSCULAR | Status: AC
  Filled 2016-08-26: qty 2

## 2016-08-26 MED ORDER — ONDANSETRON HCL 4 MG/2ML IJ SOLN
4.0000 mg | INTRAMUSCULAR | Status: DC | PRN
  Administered 2016-08-26 – 2016-08-27 (×5): 4 mg via INTRAVENOUS
  Filled 2016-08-26 (×5): qty 2

## 2016-08-26 MED ORDER — PROMETHAZINE HCL 25 MG/ML IJ SOLN
6.2500 mg | INTRAMUSCULAR | Status: DC | PRN
Start: 2016-08-26 — End: 2016-08-26

## 2016-08-26 MED ORDER — SODIUM CHLORIDE 0.9% FLUSH
3.0000 mL | Freq: Two times a day (BID) | INTRAVENOUS | Status: DC
  Administered 2016-08-28 – 2016-08-30 (×3): 3 mL via INTRAVENOUS

## 2016-08-26 MED ORDER — PANTOPRAZOLE SODIUM 40 MG PO TBEC
40.0000 mg | DELAYED_RELEASE_TABLET | Freq: Every day | ORAL | Status: DC
  Administered 2016-08-26 – 2016-08-30 (×5): 40 mg via ORAL
  Filled 2016-08-26 (×5): qty 1

## 2016-08-26 MED ORDER — KETAMINE HCL 10 MG/ML IJ SOLN
INTRAMUSCULAR | Status: DC | PRN
  Administered 2016-08-26 (×3): 20 mg via INTRAVENOUS

## 2016-08-26 MED ORDER — HEMOSTATIC AGENTS (NO CHARGE) OPTIME
TOPICAL | Status: DC | PRN
  Administered 2016-08-26: 1

## 2016-08-26 MED ORDER — FLEET ENEMA 7-19 GM/118ML RE ENEM: 1.0000 | ENEMA | Freq: Once | RECTAL | Status: DC | PRN

## 2016-08-26 MED ORDER — HYDROMORPHONE HCL 1 MG/ML IJ SOLN
INTRAMUSCULAR | Status: AC
  Filled 2016-08-26: qty 1

## 2016-08-26 MED ORDER — KETOROLAC TROMETHAMINE 30 MG/ML IJ SOLN
INTRAMUSCULAR | Status: AC
  Filled 2016-08-26: qty 1

## 2016-08-26 MED ORDER — MEPERIDINE HCL 25 MG/ML IJ SOLN: 6.2500 mg | INTRAMUSCULAR | Status: DC | PRN

## 2016-08-26 MED ORDER — MENTHOL 3 MG MT LOZG: 1.0000 | LOZENGE | OROMUCOSAL | Status: DC | PRN

## 2016-08-26 MED ORDER — MORPHINE SULFATE (PF) 2 MG/ML IV SOLN
1.0000 mg | INTRAVENOUS | Status: DC | PRN
  Administered 2016-08-26 (×2): 2 mg via INTRAVENOUS
  Administered 2016-08-26: 3 mg via INTRAVENOUS
  Administered 2016-08-27: 4 mg via INTRAVENOUS
  Administered 2016-08-27: 2 mg via INTRAVENOUS
  Administered 2016-08-27 – 2016-08-28 (×7): 4 mg via INTRAVENOUS
  Administered 2016-08-28 (×3): 2 mg via INTRAVENOUS
  Administered 2016-08-29 (×2): 4 mg via INTRAVENOUS
  Administered 2016-08-29: 2 mg via INTRAVENOUS
  Administered 2016-08-29 – 2016-08-30 (×3): 4 mg via INTRAVENOUS
  Filled 2016-08-26 (×3): qty 2
  Filled 2016-08-26 (×2): qty 1
  Filled 2016-08-26 (×3): qty 2
  Filled 2016-08-26: qty 1
  Filled 2016-08-26: qty 2
  Filled 2016-08-26 (×3): qty 1
  Filled 2016-08-26 (×5): qty 2
  Filled 2016-08-26: qty 1
  Filled 2016-08-26: qty 2

## 2016-08-26 MED ORDER — METHOCARBAMOL 1000 MG/10ML IJ SOLN
500.0000 mg | Freq: Four times a day (QID) | INTRAVENOUS | Status: DC | PRN
  Administered 2016-08-26 – 2016-08-27 (×2): 500 mg via INTRAVENOUS
  Filled 2016-08-26 (×3): qty 5

## 2016-08-26 MED ORDER — THROMBIN 20000 UNITS EX SOLR
CUTANEOUS | Status: DC | PRN
  Administered 2016-08-26: 20 mL via TOPICAL

## 2016-08-26 MED ORDER — METHOCARBAMOL 500 MG PO TABS
500.0000 mg | ORAL_TABLET | Freq: Four times a day (QID) | ORAL | Status: DC | PRN
  Administered 2016-08-27 – 2016-08-29 (×7): 500 mg via ORAL
  Filled 2016-08-26 (×9): qty 1

## 2016-08-26 MED ORDER — HYDROMORPHONE HCL 1 MG/ML IJ SOLN
0.2500 mg | INTRAMUSCULAR | Status: DC | PRN
  Administered 2016-08-26 (×4): 0.5 mg via INTRAVENOUS

## 2016-08-26 MED ORDER — FENTANYL CITRATE (PF) 100 MCG/2ML IJ SOLN
INTRAMUSCULAR | Status: DC | PRN
  Administered 2016-08-26: 200 ug via INTRAVENOUS

## 2016-08-26 MED ORDER — THROMBIN 5000 UNITS EX SOLR
CUTANEOUS | Status: DC | PRN
  Administered 2016-08-26: 2000 [IU] via TOPICAL

## 2016-08-26 MED ORDER — LIDOCAINE 2% (20 MG/ML) 5 ML SYRINGE
INTRAMUSCULAR | Status: AC
  Filled 2016-08-26: qty 5

## 2016-08-26 MED ORDER — GLYCOPYRROLATE 0.2 MG/ML IJ SOLN
INTRAMUSCULAR | Status: DC | PRN
  Administered 2016-08-26: 0.2 mg via INTRAVENOUS

## 2016-08-26 MED ORDER — ROCURONIUM BROMIDE 10 MG/ML (PF) SYRINGE
PREFILLED_SYRINGE | INTRAVENOUS | Status: AC
  Filled 2016-08-26: qty 10

## 2016-08-26 MED ORDER — MIDAZOLAM HCL 2 MG/2ML IJ SOLN
INTRAMUSCULAR | Status: DC | PRN
  Administered 2016-08-26 (×2): 1 mg via INTRAVENOUS

## 2016-08-26 MED ORDER — METOPROLOL TARTRATE 5 MG/5ML IV SOLN
INTRAVENOUS | Status: AC
  Filled 2016-08-26: qty 5

## 2016-08-26 MED ORDER — THROMBIN 20000 UNITS EX KIT
PACK | CUTANEOUS | Status: AC
  Filled 2016-08-26: qty 1

## 2016-08-26 MED ORDER — POLYETHYLENE GLYCOL 3350 17 G PO PACK
17.0000 g | PACK | Freq: Every day | ORAL | Status: DC | PRN
  Administered 2016-08-29: 17 g via ORAL
  Filled 2016-08-26: qty 1

## 2016-08-26 MED ORDER — PHENYLEPHRINE 40 MCG/ML (10ML) SYRINGE FOR IV PUSH (FOR BLOOD PRESSURE SUPPORT)
PREFILLED_SYRINGE | INTRAVENOUS | Status: AC
  Filled 2016-08-26: qty 10

## 2016-08-26 MED ORDER — 0.9 % SODIUM CHLORIDE (POUR BTL) OPTIME
TOPICAL | Status: DC | PRN
  Administered 2016-08-26: 1000 mL

## 2016-08-26 MED ORDER — PHENYLEPHRINE HCL 10 MG/ML IJ SOLN
INTRAVENOUS | Status: DC | PRN
  Administered 2016-08-26: 50 ug/min via INTRAVENOUS

## 2016-08-26 MED ORDER — LIDOCAINE HCL (CARDIAC) 20 MG/ML IV SOLN
INTRAVENOUS | Status: DC | PRN
  Administered 2016-08-26: 20 mg via INTRATRACHEAL

## 2016-08-26 MED FILL — Heparin Sodium (Porcine) Inj 1000 Unit/ML: INTRAMUSCULAR | Qty: 30 | Status: AC

## 2016-08-26 SURGICAL SUPPLY — 85 items
ADH SKN CLS APL DERMABOND .7 (GAUZE/BANDAGES/DRESSINGS) ×2
BUR MATCHSTICK NEURO 3.0 LAGG (BURR) IMPLANT
CLSR STERI-STRIP ANTIMIC 1/2X4 (GAUZE/BANDAGES/DRESSINGS) ×1 IMPLANT
COVER SURGICAL LIGHT HANDLE (MISCELLANEOUS) ×2 IMPLANT
DECANTER SPIKE VIAL GLASS SM (MISCELLANEOUS) ×2 IMPLANT
DERMABOND ADVANCED (GAUZE/BANDAGES/DRESSINGS) ×2
DERMABOND ADVANCED .7 DNX12 (GAUZE/BANDAGES/DRESSINGS) ×2 IMPLANT
DRAIN PENROSE 1/4X12 LTX STRL (WOUND CARE) ×1 IMPLANT
DRAPE C-ARM 42X72 X-RAY (DRAPES) ×3 IMPLANT
DRAPE C-ARMOR (DRAPES) ×1 IMPLANT
DRAPE INCISE IOBAN 66X45 STRL (DRAPES) IMPLANT
DRAPE MICROSCOPE LEICA (MISCELLANEOUS) ×1 IMPLANT
DRAPE SURG 17X23 STRL (DRAPES) ×7 IMPLANT
DRSG MEPILEX BORDER 4X4 (GAUZE/BANDAGES/DRESSINGS) ×2 IMPLANT
DRSG MEPILEX BORDER 4X8 (GAUZE/BANDAGES/DRESSINGS) ×3 IMPLANT
DURAPREP 26ML APPLICATOR (WOUND CARE) ×2 IMPLANT
ELECT BLADE 4.0 EZ CLEAN MEGAD (MISCELLANEOUS) ×2
ELECT CAUTERY BLADE 6.4 (BLADE) ×2 IMPLANT
ELECT REM PT RETURN 9FT ADLT (ELECTROSURGICAL) ×2
ELECTRODE BLDE 4.0 EZ CLN MEGD (MISCELLANEOUS) ×1 IMPLANT
ELECTRODE REM PT RTRN 9FT ADLT (ELECTROSURGICAL) ×1 IMPLANT
GAUZE SPONGE 4X4 16PLY XRAY LF (GAUZE/BANDAGES/DRESSINGS) ×2 IMPLANT
GLOVE BIO SURGEON STRL SZ7 (GLOVE) ×1 IMPLANT
GLOVE BIOGEL PI IND STRL 7.0 (GLOVE) IMPLANT
GLOVE BIOGEL PI IND STRL 8 (GLOVE) ×1 IMPLANT
GLOVE BIOGEL PI INDICATOR 7.0 (GLOVE) ×4
GLOVE BIOGEL PI INDICATOR 8 (GLOVE) ×1
GLOVE ECLIPSE 9.0 STRL (GLOVE) ×2 IMPLANT
GLOVE ORTHO TXT STRL SZ7.5 (GLOVE) ×2 IMPLANT
GLOVE SS BIOGEL STRL SZ 7.5 (GLOVE) ×1 IMPLANT
GLOVE SUPERSENSE BIOGEL SZ 7.5 (GLOVE) ×1
GLOVE SURG 8.5 LATEX PF (GLOVE) ×2 IMPLANT
GLOVE SURG SS PI 7.0 STRL IVOR (GLOVE) ×2 IMPLANT
GLOVE SURG SS PI 7.5 STRL IVOR (GLOVE) ×2 IMPLANT
GOWN STRL REUS W/ TWL LRG LVL3 (GOWN DISPOSABLE) ×4 IMPLANT
GOWN STRL REUS W/TWL 2XL LVL3 (GOWN DISPOSABLE) ×4 IMPLANT
GOWN STRL REUS W/TWL LRG LVL3 (GOWN DISPOSABLE) ×8
INSERT FOGARTY SM (MISCELLANEOUS) IMPLANT
KIT BASIN OR (CUSTOM PROCEDURE TRAY) ×2 IMPLANT
KIT ROOM TURNOVER OR (KITS) ×3 IMPLANT
LOOP VESSEL MINI RED (MISCELLANEOUS) IMPLANT
NS IRRIG 1000ML POUR BTL (IV SOLUTION) ×2 IMPLANT
PACK LAMINECTOMY ORTHO (CUSTOM PROCEDURE TRAY) ×2 IMPLANT
PAD ARMBOARD 7.5X6 YLW CONV (MISCELLANEOUS) ×8 IMPLANT
PEEK SYNFIX 26X32X15 12D STR (Peek) ×1 IMPLANT
PUTTY BONE DBX 2.5 MIS (Bone Implant) ×1 IMPLANT
SCREW 30MM (Screw) ×2 IMPLANT
SCREW LOCK FINE TIP 25MM (Screw) ×4 IMPLANT
SPONGE INTESTINAL PEANUT (DISPOSABLE) ×8 IMPLANT
SPONGE LAP 18X18 X RAY DECT (DISPOSABLE) IMPLANT
SPONGE LAP 4X18 X RAY DECT (DISPOSABLE) ×1 IMPLANT
SPONGE SURGIFOAM ABS GEL 100 (HEMOSTASIS) ×1 IMPLANT
STAPLER VISISTAT 35W (STAPLE) ×1 IMPLANT
SURGIFLO W/THROMBIN 8M KIT (HEMOSTASIS) ×1 IMPLANT
SUT MNCRL AB 4-0 PS2 18 (SUTURE) ×1 IMPLANT
SUT PDS AB 1 CT  36 (SUTURE) ×1
SUT PDS AB 1 CT 36 (SUTURE) IMPLANT
SUT PROLENE 4 0 RB 1 (SUTURE)
SUT PROLENE 4-0 RB1 .5 CRCL 36 (SUTURE) ×4 IMPLANT
SUT PROLENE 5 0 C 1 24 (SUTURE) IMPLANT
SUT PROLENE 5 0 CC1 (SUTURE) IMPLANT
SUT PROLENE 6 0 C 1 30 (SUTURE) ×1 IMPLANT
SUT PROLENE 6 0 CC (SUTURE) IMPLANT
SUT SILK 2 0 TIES 10X30 (SUTURE) ×3 IMPLANT
SUT SILK 2 0SH CR/8 30 (SUTURE) IMPLANT
SUT SILK 3 0 TIES 10X30 (SUTURE) ×2 IMPLANT
SUT SILK 3 0SH CR/8 30 (SUTURE) IMPLANT
SUT VIC AB 0 CT1 27 (SUTURE) ×2
SUT VIC AB 0 CT1 27XBRD ANBCTR (SUTURE) ×2 IMPLANT
SUT VIC AB 1 CT1 27 (SUTURE) ×2
SUT VIC AB 1 CT1 27XBRD ANBCTR (SUTURE) IMPLANT
SUT VIC AB 1 CTX 36 (SUTURE) ×2
SUT VIC AB 1 CTX36XBRD ANBCTR (SUTURE) ×2 IMPLANT
SUT VIC AB 2-0 CT1 18 (SUTURE) ×2 IMPLANT
SUT VIC AB 2-0 CT1 27 (SUTURE) ×2
SUT VIC AB 2-0 CT1 36 (SUTURE) ×2 IMPLANT
SUT VIC AB 2-0 CT1 TAPERPNT 27 (SUTURE) IMPLANT
SUT VIC AB 3-0 FS2 27 (SUTURE) ×2 IMPLANT
SUT VIC AB 3-0 SH 27 (SUTURE) ×2
SUT VIC AB 3-0 SH 27X BRD (SUTURE) ×1 IMPLANT
SYR BULB IRRIGATION 50ML (SYRINGE) ×2 IMPLANT
TOWEL OR 17X24 6PK STRL BLUE (TOWEL DISPOSABLE) ×2 IMPLANT
TOWEL OR 17X26 10 PK STRL BLUE (TOWEL DISPOSABLE) ×2 IMPLANT
TRAY FOLEY CATH 16FR SILVER (SET/KITS/TRAYS/PACK) ×2 IMPLANT
YANKAUER SUCT BULB TIP NO VENT (SUCTIONS) ×2 IMPLANT

## 2016-08-26 NOTE — Op Note (Addendum)
08/26/2016  11:14 AM  PATIENT:  [name] [name]  45 y.o. 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     COMPONENTS:   Implant Name Type Inv. Item Serial No. Manufacturer Lot No. LRB No. Used  PEEK SYNFIX [id number] 12D STR - [id number] Peek PEEK SYNFIX [id number] 12D STR  SYNTHES SPINE [id number]  1  PUTTY 2.5 DBX MIS - [id number] Bone Implant PUTTY 2.5 DBX MIS [id number] MUSCULOSKELETL TRANSPLANT FNDN 1111  1  SCREW LOCK FINE TIP 25MM - [id number] Screw SCREW LOCK FINE TIP 25MM  SYNTHES SPINE  N/A 4  SCREW 30MM - [id number] Screw SCREW 30MM   SYNTHES SPINE   N/A 2    PROCEDURE:The patient was met in the holding area, and the appropriate lumbar level Left L5-S1 and left iliac crest  identified and marked with an x and my initials.The patient was then transported to OR. The patient was then placed under general anesthesia without difficulty.The patient received appropriate preoperative antibiotic prophylaxis ancef.  Nursing staff inserted a Foley catheter under sterile conditions. She was then placed in a supine position Jackson spine table was used for this case.A transverse bolster placed at the level of the lumbosacral junction using a gel pad. All pressure points were well padded PAS stocking applied bilateral lower extremity to prevent DVT. Standard prep DuraPrep solution. Draped in the usual  manner. Time-out procedure was called and correct .  Dr. Sherren Mocha Early performed the exposure. Prior to intervention he used C-arm fluoroscopy to identify the level for the incision to approach the L5-S1 level anteriorly. He performed a standard anterior retroperitoneal approach to the above sacral spine using a transverse incision just below the expected L5-S1 level. Rectus muscle was mobilized and the interval between the peroneal sac and the lateral wall was then developed exposing the iliac vessels and identifying the ureter. This was then mobilized across midline and small blood vessels over the anterior aspect of the L5-S1 level was mobilized soft tissue was mobilized bilaterally exposing the 5 S1 level between the common iliac vessels that had bifurcated at the level above L4. Careful exposure was obtained. Thompson retractor is placed with self-retaining blades. A small vessel clamp was then placed at the expected midline and a 22-gauge needle placed into the L5-S1 disc space and sterilely draped C-arm fluoroscopy used to identify the disc space and marked marking the central decompression of the anterior aspect of the disc space at L5-S1. Marked with a marking and. A 12 mm trial implant then used to determine the sagitally oriented lateral incisions over the anterior aspect of the disc a 10 blade scalpel was then used to incise both sides of the into the disc space and transverse incisions made removing the annulus anteriorly as well as the anterior longitudinal ligament. Anterior lip osteophytes resected off the anterior inferior aspect of L5 and the disc was then carefully debrided using curettage as well as  pituitary rongeurs with loope magnification and headlight. Straight and reversed angled curettes on curettes were used for this portion procedure. Disc space then dilated using twist dilators first  11 mm then 13 mm and then 15 mm or so used to dilate this space. Dilators in place in the posterior  aspect of the disc space was then debrided further using curettage as well as a 3 mm Kerrisons. Debridement was carried out until the endplates were felt to be a down to the bony endplate. All cartilaginous material been resected off of the inferior aspect of L5 and the superior aspect of S1. The operating room microscope was then sterilely draped brought into the field under the operating room microscope then the posterior left side of the disc space was explored. The rent or tear in the posterior annular fibers noted extending into the left lateral recess at L5-S1. The area was debrided using a 3 mm Kerrisons as well as pituitary rongeurs piece of cartilage material was found to be impressing upon the left S1-S2 nerve roots resected after first thing freed up with curettes anterior rongeur was used to debride this. Disc debridement was further carried out until a Barbra Sarks could be passed out the left S1 neuroforamen as well as left L5 neuroforamen demonstrating patency with no further compression on the side thecal sac. Using the OR microscope then carefully further debridement of disc endplates was carried out superior aspect of S1 and inferior aspect of L5 until bleeding endplate bone was noted. Heart endplate was left intact as best as possible. Osteotome was used to smooth some areas of the superior aspect of S1 and inferior aspect of L5 in order to provide for better implant. Trial implants were then carried out using oh millimeters and then 13 mm and 15 mm cages the trials then for a 17 mm implant the 17 mm implant was felt to be too large and extended the anterior to the anterior aspect of the disc space at L5-S1. Therefore the 15 mm implant Synfix was brought onto the field. Left iliac crest bone graft was then harvested through a separate incision approximately 1-1/2 inch incision was made of the superficial aspect of the left iliac crest laterally to fingerbreadths posterior to the left anterior superior  iliac crest spine. Incision through skin and subcutaneous layers recommended down to bone electrocautery used to control bleeders. Subperiosteal dissection then used to place the fibers inserting into the anterior edge of the iliac crest near over an area of about an inch and a half to 2 inches. Subperiosteal dissection then carried inferiorly using the here. Or inch osteotome then used to disc or the iliac crest laterally and superiorly window then of cortical cancellus bone was then resected the superior lateral and of the iliac crest rim 4 inch osteotomes. Additional cancellus bone was harvested from the inner plate area using curettes. This bone graft was then used to fill the Synthes 6:15 millimeter height implant implant 26 mm a 52 mm wide was chosen with a 15 mm height. His carefully placed within the feet cage portion of the implant packing until the packing material was protruding from both superior and inferior margin implant. Bone putty was used to hold graft in place. DBX bone graft putty. The depth of the vertebral disc space was measured using the backward angled curette and a tonsil clamp to marsh and to check the depth against posterior margin of the disc space correct at about 30 mm depth. Her against the  implant implant was lesser size. 15 mm 12 lordosis implant was then impacted into place subset beneath the anterior aspect of the disc space by 3 or 4 mm. Fluoroscopy used to verify the the correct position alignment of the implant on the AP and lateral views. The jig and then used for placement of the oblique screws in the vertebral bodies of L5 and S1 locking the skin fix into place was then carefully aligned vertically and then attached to the anterior plate of the implant. Then using the hand-held awl opening was then made into the superior endplate of S1 left-hand side. Directed across from the right-hand side obliquely and posteriorly and inferiorly she is then to obtain correct depth about  20 mm a 25 mm screw was chosen this was then used to lace initial screw into the superior endplate of S1 on the left side. Then using all on the left-hand side going from inferior to superior posterior to the screw for the left-hand side into the left inferior endplate of L5 first awl was placed and then screws placed there was significant difficulty placing the screw primarily because of the parallel this to the S1 body and significant lordosis here. East 2 screws stripped before surgery was able be placed and the insertion device was removed in order to find the seat screw into the plate at the L5 level. R was then used to verify correct this alignment of the screws 30 mm screw was used on the left side at the L5 level obtaining excellent purchase here. Attention then turned to the right-hand side the used for placing the the awl and then the screws was then done 180 and then replaced in the anterior aspect of the implant again all used for making the initial entry into both the inferior portion of the plate into the superior endplate of S1 going from the left side across I and inferiorly. Was done to 20 mm and then a 25 mm screw was placed and this was done without difficulty. Next on the right-hand side L5 screw was placed similarly a significant angle inferior to superior was necessary. Did provide for some difficulty placing the screw however the screw did proceed to recur with all been placed and then the 5 mm screw placed at this level paining excellent purchase. Bone graft was then packed on both sides of the implant using remaining cortical cancellus bone graft that been harvested from left iliac crest. Additional bone graft putty was used. Following this then the C-arm fluoroscopy was used to ascertain the correct position alignment of the implant both AP and lateral views. Permanent C-arm images were obtained for documentation purposes. Following irrigation and then the retractors were the [facility] self  retraction system were then removed and again further images obtained documenting the position alignment of the 4 locking screws with the Synthes fix the implant in good position alignment. No evidence retropulsion of the implant noted. Left iliac crest bone graft harvest site was then irrigated with copious amounts of irrigant solution periosteal layer reapproximated with interrupted 0 Vicryl suture the subcutaneous layers approximated with interrupted 2-0 Vicryl sutures and skin closed with running subcutaneous stitch of 3-0 Vicryl this area subcutaneous was infiltrated with local Marcaine/exparel solution. Dr. Tawni Millers performed closure of the left lower abdominal insight to use for the exposure the anterior aspect lumbar spine. Irrigation and closure performed of the fascial layers and subcutaneous layers and skin in layered the fact. Dressing was then applied here. For details  regarding this please see Dr. Luther Parody note. Additionally left lower abdominal incision was infiltrated with Marcaine half percent with 1 to 1 Exparel 1.3% total 30 mL used for the case. Dressings then applied likes the left iliac crest and left lower, incision site. Patient was then reactivated extubated and returned to recovery room in satisfactory condition   Benjiman Core PA-C perform the duties of assistant surgeon during this case. He was present from the beginning of the case to the end of the case assisting in transfer the patient from his stretcher to the OR table and back to the stretcher at the end of the case. Assisted in careful retraction and suction of thdiscectomy site deabdominal tructures operating under the operating room microscope. He performed closure of the incision from the left iliac crest bone graft harvest site  fascia to the skin applying the dressing.    Isamu Trammel E 08/26/2016, 11:14 AM

## 2016-08-26 NOTE — Op Note (Signed)
    OPERATIVE REPORT  DATE OF SURGERY: 08/26/2016  PATIENT: [name] [name], 45 y.o. female MRN: [id number]  DOB: 12-07-1971  PRE-OPERATIVE DIAGNOSIS: Degenerative disc disease  POST-OPERATIVE DIAGNOSIS:  Same  PROCEDURE: Anterior exposure for L5-S1 disc surgery  SURGEON:  Curt Jews, M.D.  Co-surgeon for the exposure: Dr. Herminio Commons  ANESTHESIA:  Gen.  EBL: Minimal ml  Total I/O In: 2600 [I.V.:2600] Out: 475 [Urine:350; Blood:125]  BLOOD ADMINISTERED: None  DRAINS: None  SPECIMEN: None  COUNTS CORRECT:  YES  PLAN OF CARE: PACU   PATIENT DISPOSITION:  PACU - hemodynamically stable  PROCEDURE DETAILS: Patient was taken of replaced supine position where the area of the abdomen was prepped in usual sterile fashion. Crosstable lateral C-arm projection reveal the level of the L5-S1 disc. Incision was made transversely from the midline to the left and carried down through the subcutaneous tissue of left cautery. The fat was mobilized off the anterior rectus sheath. Anterior rectus sheath was opened in line with the incision. The rectus muscle was mobilized circumferentially. The risks peroneal space was entered and the left lower quadrant lateral to the rectus muscle. The peritoneal contents were mobilized to the right. The left ureter was identified and mobilized to the right as well. Dissection extended above the level of the psoas muscle and anterior to the iliac vessels. Blunt dissection over the L5-S1 disc was continued to give adequate exposure to the right and left of the disc. The Thompson retractor was brought onto the field. Reverse lip 120 blades were positioned to the right and left of the L5-S1 disc. The table 140 blades were positioned for superior exposure. The discectomy and fusion will be dictated as a separate note by Dr.Nitka..  Following the fusion. I re-scrubbed and examined the peritoneal space. There was no active bleeding. This was irrigated. The  abdominal incision was closed with a running 0 PDS suture in the anterior rectus sheath. The wound again was irrigated with saline. The skin was closed with a 3-0 subcuticular Vicryl suture. Sterile dressing was applied the patient was transferred to the recovery room stable condition   Curt Jews, M.D. 08/26/2016 3:32 PM

## 2016-08-26 NOTE — Anesthesia Preprocedure Evaluation (Addendum)
Anesthesia Evaluation  Patient identified by MRN, date of birth, ID band Patient awake    Reviewed: Allergy & Precautions, NPO status , Patient's Chart, lab work & pertinent test results  History of Anesthesia Complications Negative for: history of anesthetic complications  Airway Mallampati: II  TM Distance: >3 FB Neck ROM: Full    Dental  (+) Dental Advisory Given, Poor Dentition   Pulmonary COPD, Current Smoker, PE (2009)   breath sounds clear to auscultation       Cardiovascular negative cardio ROS   Rhythm:Regular Rate:Normal     Neuro/Psych Chronic back pain: narcotics negative psych ROS   GI/Hepatic negative GI ROS, Neg liver ROS,   Endo/Other  negative endocrine ROS  Renal/GU negative Renal ROS     Musculoskeletal negative musculoskeletal ROS (+)   Abdominal   Peds  Hematology  (+) Blood dyscrasia (protein C deficiency), ,   Anesthesia Other Findings   Reproductive/Obstetrics negative OB ROS                            Anesthesia Physical Anesthesia Plan  ASA: II  Anesthesia Plan: General   Post-op Pain Management:    Induction: Intravenous  Airway Management Planned: Oral ETT  Additional Equipment:   Intra-op Plan:   Post-operative Plan: Extubation in OR  Informed Consent: I have reviewed the patients History and Physical, chart, labs and discussed the procedure including the risks, benefits and alternatives for the proposed anesthesia with the patient or authorized representative who has indicated his/her understanding and acceptance.   Dental advisory given  Plan Discussed with: CRNA and Surgeon  Anesthesia Plan Comments: (Plan routine monitors, GETA)        Anesthesia Quick Evaluation

## 2016-08-26 NOTE — Interval H&P Note (Signed)
Patient was seen and examined in the preop holding area. There has been no interval  Change in this patient's exam preop  history and physical exam  Lab tests and images have been examined and reviewed.  The Risks benefits and alternative treatments have been discussed  extensively,questions answered.  The patient has elected to undergo the discussed surgical treatment. 

## 2016-08-26 NOTE — Transfer of Care (Signed)
Immediate Anesthesia Transfer of Care Note  Patient: [name] [name]  Procedure(s) Performed: Procedure(s): ANTERIOR LUMBAR FUSION L5-S1, EXCISION OF HERNIATED DISC LEFT L5-S1, ZERO P LUMBAR INTERBODY IMPLANT, LEFT ILIAC CREST BONE GRAFT  (N/A) ABDOMINAL EXPOSURE (N/A)  Patient Location: PACU  Anesthesia Type:General  Level of Consciousness: awake, alert  and patient cooperative  Airway & Oxygen Therapy: Patient Spontanous Breathing and Patient connected to nasal cannula oxygen  Post-op Assessment: Report given to RN, Post -op Vital signs reviewed and stable, Patient moving all extremities X 4 and Patient able to stick tongue midline  Post vital signs: Reviewed and stable  Last Vitals:  Vitals:   08/26/16 1143 08/26/16 1146  BP:  (P) 127/81  Pulse:  (P) 90  Resp:  (P) 18  Temp: 36.2 C 36.2 C    Last Pain:  Vitals:   08/26/16 0552  TempSrc: Oral  PainSc:          Complications: No apparent anesthesia complications

## 2016-08-26 NOTE — Discharge Instructions (Signed)
° ° °  No lifting greater than 10 lbs. Avoid bending, stooping and twisting. Walk in house for first week them may start to get out slowly increasing distance up to one mile by 3 weeks post op. Keep incision dry for 3 days, may use tegaderm or similar water impervious dressing.    Call if there is increasing drainage, fever greater than 101.5, severe head aches, and worsening nausea or light sensitivity. If shortness of breath, bloody cough or chest tightness or pain go to an emergency room. No lifting greater than 10 lbs. Avoid bending, stooping and twisting. Use brace when sitting and out of bed even to go to bathroom. Walk in house for first 2 weeks then may start to get out slowly increasing distances up to one half mile by 4-6 weeks post op. After 5 days may shower and change dressing following bathing with shower.When bathing remove the brace shower and replace brace before getting out of the shower. If drainage, keep dry dressing and do not bathe the incision, use an moisture impervious dressing. Please call and return for scheduled follow up appointment 2 weeks from the time of surgery.

## 2016-08-26 NOTE — Brief Op Note (Addendum)
PATIENT ID:      [name] [name]  MRN:     [id number] DOB/AGE:    [birth year] / 45 y.o.       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    PROCEDURE:  Procedure(s): ANTERIOR LUMBAR FUSION L5-S1, EXCISION OF HERNIATED DISC LEFT L5-S1, ZERO P LUMBAR INTERBODY IMPLANT, LEFT ILIAC CREST BONE GRAFT  ABDOMINAL EXPOSURE    SURGEON: NITKA,JAMES E   ASSISTANT: Esaw Grandchild          ANESTHESIA:  General supplemented with local marcaine 0/5% 1:1 exparel 1.3% total 30cc,  Dr. Glennon Mac.  EBL:100cc  DRAINS:Foley to SD  COMPONENTS:   Implant Name Type Inv. Item Serial No. Manufacturer Lot No. LRB No. Used  PEEK SYNFIX [id number] 12D STR - [id number] Peek PEEK SYNFIX [id number] 12D STR  [employer] [id number]  1  PUTTY 2.5 DBX MIS - [id number] Bone Implant PUTTY 2.5 DBX MIS [id number] MUSCULOSKELETL TRANSPLANT FNDN 1111  1  SCREW LOCK FINE TIP 25MM - [id number] Screw SCREW LOCK FINE TIP 25MM  [employer]  N/A 4  SCREW 30MM - [id number] Screw SCREW 30MM   [employer]   N/A 2   FINDINGS: Degenerative Disc Disease L5-S1, HNP left L5-S1 with small osteocartilage fragment impressing on the left S1.  COMPLICATIONS:  None   CONDITION:  stable    NITKA,JAMES E 08/26/2016, 11:08 AM

## 2016-08-26 NOTE — Progress Notes (Signed)
Orthopedic Tech Progress Note Patient Details:  [name] [name] 07/11/1971 [id number] Called [employer] for brace order. Patient ID: [name], female   DOB: [birth year], 45 y.o.   MRN: [id number]   Braulio Bosch 08/26/2016, 2:59 PM

## 2016-08-26 NOTE — H&P (Signed)
[name] [name] is an 45 y.o. female.   HISTORY OF PRESENT ILLNESS:  [name] [name] is seen today.  She was seen in the emergency room yesterday and she was complaining of pain in her back and discomfort she has had into her left lower extremity.  She apparently had a significant worsening of discomfort.  She reports having difficulty even extending her back, trying to stand upright due to severe pain.  The pain is over the right lower back and into her right leg.  She was seen at the emergency room.  I received a phone call as I was going in for surgery, unable to really contact or see the patient in the emergency room, as I was in surgery for 5 or 6 hours.  She, however, was seen.  Underwent an MRI scan and the results of the MRI scan showed severe modic changes occurring across the L5-S1 level that is worse compared with previous studies that she had prior to undergoing left-sided redo microdiscectomy for herniated nucleus pulposus.  The results of the study indicate the changes that are occurring in the vertebral body of both L5 and S1.  These are type 2 modic changes and represent edema and can be a source of severe mechanical back pain.  Her pain radiates into her buttocks and down her thighs.  It is worse with any kind of bending or stooping.  She ambulates today using a walker and has been using a cane as well. In the past we had requested clearance by the insurance company to perform an anterior lumbar interbody fusion at the L5-S1 level and this was going to be done primarily because of recurrent disc protrusion at this segment and concern of persistent nerve compression; however, it does not appear on the most recent MRI that this is a concern as much as the severe modic changes and severe degenerative disc disease that is noted here, single level.    Past Medical History:  Diagnosis Date  . Chronic back pain    stenosis and HNP  . History of pulmonary embolus (PE) 03/2008  . Pneumonia     "walking" in the 90's  . Urinary urgency   . Weakness    numbness and tingling on left side    Past Surgical History:  Procedure Laterality Date  . ABDOMINAL HYSTERECTOMY    . BACK SURGERY    . DIAGNOSTIC LAPAROSCOPY  2004   x3  . DILATION AND EVACUATION N/A 01/28/2013   Procedure: DILATATION AND EVACUATION;  Surgeon: Princess Bruins, MD;  Location: [facility];  Service: Gynecology;  Laterality: N/A;  . KNEE ARTHROSCOPY Left    x 3  . LUMBAR LAMINECTOMY N/A 12/10/2013   Procedure: LEFT L5-S1 MICRODISCECTOMY USING MIS;  Surgeon: Jessy Oto, MD;  Location: [facility];  Service: Orthopedics;  Laterality: N/A;  . LUMBAR LAMINECTOMY N/A 12/30/2014   Procedure: LEFT L5-S1 Redo MICRODISCECTOMY;  Surgeon: Jessy Oto, MD;  Location: [facility];  Service: Orthopedics;  Laterality: N/A;  . ROBOTIC ASSISTED TOTAL HYSTERECTOMY N/A 02/09/2013   Procedure: ROBOTIC ASSISTED TOTAL HYSTERECTOMY;  Surgeon: Princess Bruins, MD;  Location: [facility];  Service: Gynecology;  Laterality: N/A;  . UNILATERAL SALPINGECTOMY Right 02/09/2013   Procedure: UNILATERAL SALPINGECTOMY;  Surgeon: Princess Bruins, MD;  Location: [facility];  Service: Gynecology;  Laterality: Right;  . WISDOM TOOTH EXTRACTION      History reviewed. No pertinent family history. Social History:  reports that she has been smoking Cigarettes.  She  has a 12.50 pack-year smoking history. She has never used smokeless tobacco. She reports that she drinks alcohol. She reports that she does not use drugs.  Allergies:  Allergies  Allergen Reactions  . No Known Allergies     Medications Prior to Admission  Medication Sig Dispense Refill  . Oxycodone HCl 20 MG TABS Take 20 mg by mouth every 4 (four) hours as needed for pain.  0  . gabapentin (NEURONTIN) 300 MG capsule Take 1 capsule (300 mg total) by mouth 2 (two) times daily. Started 12/26/14, 1st day 1 capsule (300 mg) at bedtime, 2nd - 4th day: 1 capsule (300 mg) twice daily, 5th day and forward 1 capsule  (300 mg) 3 times daily (Patient not taking: Reported on 08/15/2016) 60 capsule 3  . methocarbamol (ROBAXIN) 500 MG tablet Take 1 tablet (500 mg total) by mouth 2 (two) times daily. (Patient not taking: Reported on 08/15/2016) 20 tablet 0  . predniSONE (DELTASONE) 20 MG tablet Take 40 mg by mouth daily for 3 days, then 20mg  by mouth daily for 3 days, then 10mg  daily for 3 days (Patient not taking: Reported on 08/15/2016) 12 tablet 0    No results found for this or any previous visit (from the past 48 hour(s)). No results found.  Review of Systems  Constitutional: Negative.   HENT: Negative.   Eyes: Negative.   Respiratory: Negative.   Cardiovascular: Negative.   Gastrointestinal: Negative.   Genitourinary: Negative.   Musculoskeletal: Positive for back pain.  Skin: Negative.   Psychiatric/Behavioral: Negative.     Blood pressure 116/86, pulse 81, temperature 97.8 F (36.6 C), temperature source Oral, resp. rate 20, height 5\' 7"  (1.702 m), weight 75.1 kg (165 lb 8 oz), SpO2 100 %. Physical Exam  Constitutional: She is oriented to person, place, and time. She appears well-developed and well-nourished. No distress.  HENT:  Head: Normocephalic and atraumatic.  Eyes: EOM are normal. Pupils are equal, round, and reactive to light.  Neck: Normal range of motion.  Cardiovascular: Normal rate.   Respiratory: Effort normal. No respiratory distress.  GI: She exhibits no distension.  Neurological: She is alert and oriented to person, place, and time.  Skin: Skin is warm and dry.  Psychiatric: She has a normal mood and affect.     PHYSICAL EXAMINATION:  Her strength in her lower extremities, dorsiflexion, and plantar flexion of the toes is intact.  Knee extension and flexion strength is intact.  She has pain, though, with any attempts of flexion of the hip here.  It is not in the hip, though, that is referred to her back.  Reflexes at the knee are 2+ and symmetric, at the ankle 0 on the left and  2+ on the right.  Her clinical exam does not show right-sided sciatic tension sign.   RADIOGRAPHS/TEST:  I reviewed this patient's MRI scan.  The radiologist indicated a nodule over the left side at the L5-S1 level and felt that this could represent scar tissue or could represent a cyst or a collection of fluid.  In reviewing the study, it is apparent that this nodule is the conjoined nerve root that we have found in both her most recent surgery and the surgery prior to that.  Likely, it is the source of some persistent discomfort in her back and into her legs.  Pain that is worsened by movement of the joint that is adjacent to this level in flexion and extension.  Pain also into the right side.  There are no significant nerve compression findings on the right side.   ASSESSMENT/DIAGNOSIS/PLAN:  This patient has severe mechanical back pain.  By her MRI scan it is confined to the L5-S1 level with modic changes on both sides of the disc space at this segment.  Her laboratory tests from the emergency room do not show any signs of infection.  Her white cell count was 7500.  Her CRP was 0.7 and her differential on her white cell count was negative for any left shift.  She had a sed rate of 6.  Her glucose level was 62.  The written report also on her MRI scan had noted foraminal narrowing present at L5-S1 and some enhancing signal from the exiting left L5 nerve root.  Disc space height had also decreased significantly at the L5-S1 level and progressed since her previous exam.  I believe that this patient's pain is primarily discogenic as much as it is neurogenic and that because she has a conjoined nerve root, any attempts at a fusion of an interbody type should be performed anteriorly.  Performing a TLIF in the presence of a conjoined nerve root would not be possible and a posterolateral fusion is not likely to be as beneficial in the face of discogenic pain as a TLIF and decreasing motion across the anterior  segments here and providing for relief of her pain.  I think that overall the steroid medicine given to her by the emergency room is certainly reasonable.  I also think, though, that we should go ahead and petition her insurance company to consider allowing for an anterior lumbar interbody fusion to be done at the L5-S1 level.  Decompression of the nerve root is not as necessary.  Indirect decompression of the neural foramen by performing an ALIF and elevating the disc space height likely would be very beneficial in decreasing L5-related discomfort.  Her recent surgery associated with this involved risks of anterior lumbar exposure, risks of bleeding, and risks to the nerves themselves.  As this is the lowest segment in the lumbar spine, sacral promontory, the patient's veins and arteries are primarily bifurcated at the level above and should be able to be mobilized to allow for performing the anterior procedure at this level with less risks, but still some risks associated with the major vessels here.  We had consulted Dr. Sherren Mocha Early for performing exposure in this case.  A vascular surgeon is usually the best in terms for allowing for mobilization of the vessels here for performance of the ALIF.  It is possible to do an ALIF without necessarily proceeding with posterior instrumention; however, in this patient's situation I think performing an ALIF and then turning to a supine position and performing a posterior fusion with Impact screws likely would provide [place] with the best amount of stabilization of this segment and improve the chances that this will go on to a solid fusion and perhaps be associated with the best chances of improving her overall function and decreasing mechanical pain associated with the L5-S1 level.  She will likely have some persistent neurogenic discomfort that the surgery will not improve; however, I do think that overall this stands the best chances of allowing [place] to be able to find some  improvement in her overall levels of pain and discomfort.  She is presently taking oxycodone to relieve her pain on a constant basis.  I think that at this point intervention is perhaps her best way of placing this segment at rest  and decreasing pain associated with mechanical findings and modic changes at the L5-S1 level.  Will go ahead and schedule her for surgery.  Risks of infection are 1 in 300.  Risks of bleeding less than 1% chance of transfusion.  The surgery would entail an ALIF at the L5-S1 level with placement of a cage and local bone graft from the iliac crest, use of instrumentation anteriorly and then posterior approach and placement of Impact screws and rods.  Posterior fusion using a combination of local bone graft, iliac crest bone graft and Vivigen.    Lanae Crumbly, PA-C 08/26/2016, 7:09 AM  Patient examined and lab reviewed with Ricard Dillon, PA-C.

## 2016-08-26 NOTE — Anesthesia Procedure Notes (Signed)
Procedure Name: Intubation Performed by: Annye Asa Pre-anesthesia Checklist: Patient identified, Emergency Drugs available, Suction available and Patient being monitored Patient Re-evaluated:Patient Re-evaluated prior to inductionOxygen Delivery Method: Circle system utilized Preoxygenation: Pre-oxygenation with 100% oxygen Intubation Type: IV induction Ventilation: Mask ventilation without difficulty Laryngoscope Size: Mac and 3 Grade View: Grade I Tube type: Oral Tube size: 7.0 mm Number of attempts: 1 Airway Equipment and Method: Stylet Placement Confirmation: ETT inserted through vocal cords under direct vision,  positive ETCO2 and breath sounds checked- equal and bilateral Secured at: 23 cm Tube secured with: Tape Dental Injury: Teeth and Oropharynx as per pre-operative assessment

## 2016-08-26 NOTE — Care Management Note (Signed)
Case Management Note  Patient Details  Name: [name] [name] MRN: [id number] Date of Birth: 1971-08-11  Subjective/Objective:    Pt admitted s/p ANTERIOR LUMBAR FUSION L5-S1, EXCISION OF HERNIATED DISC LEFT L5-S1, ZERO P LUMBAR INTERBODY IMPLANT, LEFT ILIAC CREST BONE GRAFT                 Action/Plan:  Pt is a English as a second language teacher - CM contacted both [facility] and [facility] to inform of admit and request HH/DME associated agencies.  CM will continue to follow for discharge needs   Expected Discharge Date:                  Expected Discharge Plan:     In-House Referral:     Discharge planning Services     Post Acute Care Choice:    Choice offered to:     DME Arranged:    DME Agency:     HH Arranged:    HH Agency:     Status of Service:     If discussed at [employer] of Stay Meetings, dates discussed:    Additional Comments:  Maryclare Labrador, RN 08/26/2016, 1:31 PM

## 2016-08-27 ENCOUNTER — Encounter (HOSPITAL_COMMUNITY): Payer: Self-pay | Admitting: Specialist

## 2016-08-27 LAB — BASIC METABOLIC PANEL
ANION GAP: 8 (ref 5–15)
BUN: <5 mg/dL — ABNORMAL LOW (ref 6–20)
CHLORIDE: 105 mmol/L (ref 101–111)
CO2: 23 mmol/L (ref 22–32)
CREATININE: 0.72 mg/dL (ref 0.44–1.00)
Calcium: 8.5 mg/dL — ABNORMAL LOW (ref 8.9–10.3)
GFR calc non Af Amer: >60 mL/min (ref >60)
GLUCOSE: 107 mg/dL — AB (ref 65–99)
Potassium: 4.1 mmol/L (ref 3.5–5.1)
Sodium: 136 mmol/L (ref 135–145)

## 2016-08-27 LAB — CBC
HEMATOCRIT: 37.7 % (ref 36.0–46.0)
HEMOGLOBIN: 12.9 g/dL (ref 12.0–15.0)
MCH: 31.1 pg (ref 26.0–34.0)
MCHC: 34.2 g/dL (ref 30.0–36.0)
MCV: 90.8 fL (ref 78.0–100.0)
Platelets: 284 10*3/uL (ref 150–400)
RBC: 4.15 MIL/uL (ref 3.87–5.11)
RDW: 12.9 % (ref 11.5–15.5)
WBC: 13.2 10*3/uL — ABNORMAL HIGH (ref 4.0–10.5)

## 2016-08-27 MED ORDER — INFLUENZA VAC SPLIT QUAD 0.5 ML IM SUSY: 0.5000 mL | PREFILLED_SYRINGE | INTRAMUSCULAR | Status: DC

## 2016-08-27 MED ORDER — OXYCODONE HCL 5 MG PO TABS
20.0000 mg | ORAL_TABLET | ORAL | Status: DC | PRN
  Administered 2016-08-27 – 2016-08-30 (×16): 20 mg via ORAL
  Filled 2016-08-27 (×17): qty 4

## 2016-08-27 MED ORDER — ENOXAPARIN SODIUM 30 MG/0.3ML ~~LOC~~ SOLN
30.0000 mg | Freq: Two times a day (BID) | SUBCUTANEOUS | Status: DC
  Administered 2016-08-27 – 2016-08-29 (×6): 30 mg via SUBCUTANEOUS
  Filled 2016-08-27 (×6): qty 0.3

## 2016-08-27 NOTE — Anesthesia Postprocedure Evaluation (Signed)
Anesthesia Post Note  Patient: [name] [name]  Procedure(s) Performed: Procedure(s) (LRB): ANTERIOR LUMBAR FUSION L5-S1, EXCISION OF HERNIATED DISC LEFT L5-S1, ZERO P LUMBAR INTERBODY IMPLANT, LEFT ILIAC CREST BONE GRAFT  (N/A) ABDOMINAL EXPOSURE (N/A)  Patient location during evaluation: PACU Anesthesia Type: General Level of consciousness: sedated, patient cooperative and oriented Pain management: pain level controlled Vital Signs Assessment: post-procedure vital signs reviewed and stable Respiratory status: spontaneous breathing, nonlabored ventilation, respiratory function stable and patient connected to nasal cannula oxygen Cardiovascular status: blood pressure returned to baseline and stable Postop Assessment: no signs of nausea or vomiting Anesthetic complications: no Comments: Delayed entry: pt eval in PACU    Last Vitals:  Vitals:   08/27/16 0625 08/27/16 1207  BP: 124/71 103/60  Pulse: 72 71  Resp: 17   Temp: 37.1 C 36.7 C    Last Pain:  Vitals:   08/27/16 1800  TempSrc:   PainSc: 10-Worst pain ever                 Corin Tilly,E. Markeem Noreen

## 2016-08-27 NOTE — Evaluation (Signed)
Occupational Therapy Evaluation Patient Details Name: [name] [name] MRN: [id number] DOB: October 10, 1971 Today's Date: 08/27/2016    History of Present Illness This 45 y.o. 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  PMH includes:  h/o PE, chronic back pain ; h/o back surgeries   Clinical Impression   Pt admitted with above. She demonstrates the below listed deficits and will benefit from continued OT to maximize safety and independence with BADLs.  Pt limited by pain.  She requires max A +2 for bed mobility and mod A +2 for functional transfers, and max A for LB ADLs.  She has good family support and should be able to progress to level that family can safely assist her at home.  Recommend HHOT       Follow Up Recommendations  Home health OT;Supervision/Assistance - 24 hour    Equipment Recommendations  3 in 1 bedside comode;Tub/shower bench    Recommendations for Other Services       Precautions / Restrictions Precautions Precautions: Back;Fall Precaution Booklet Issued: No Precaution Comments: Pt instructed in back precautions  Required Braces or Orthoses: Spinal Brace Spinal Brace: Thoracolumbosacral orthotic;Applied in sitting position (with leg extension )      Mobility Bed Mobility Overal bed mobility: Needs Assistance;+2 for physical assistance Bed Mobility: Rolling;Sidelying to Sit;Sit to Sidelying Rolling: Max assist Sidelying to sit: +2 for physical assistance;Max assist     Sit to sidelying: Max assist;+2 for physical assistance General bed mobility comments: Pt requires cues for seqencing and back precautions.  Assist to roll due to pain, and assist to move LEs off bed and to lift trunk   Transfers Overall transfer level: Needs assistance Equipment used: Rolling walker (2 wheeled) Transfers: Sit to/from [facility] Sit to Stand: +2 physical assistance;Mod assist Stand pivot transfers: Min assist;+2 physical assistance        General transfer comment: Pt requires increased time and assist to lift buttocks from bed.  Min a +2 for transfer.  She requires increased time to extend knees and hips once in standing position     Balance Overall balance assessment: Needs assistance Sitting-balance support: Feet supported Sitting balance-Leahy Scale: Poor     Standing balance support: Bilateral upper extremity supported Standing balance-Leahy Scale: Poor                              ADL Overall ADL's : Needs assistance/impaired Eating/Feeding: Set up;Bed level   Grooming: Wash/dry hands;Wash/dry face;Oral care;Brushing hair;Set up;Bed level   Upper Body Bathing: Minimal assitance;Bed level;Sitting   Lower Body Bathing: Total assistance;Sit to/from stand   Upper Body Dressing : Moderate assistance;Sitting   Lower Body Dressing: Total assistance;Sit to/from stand   Toilet Transfer: Moderate assistance;+2 for physical assistance;Stand-pivot;BSC;RW   Toileting- Clothing Manipulation and Hygiene: Total assistance;Sit to/from stand       Functional mobility during ADLs: Moderate assistance;+2 for physical assistance;Rolling walker General ADL Comments: Pt limited by pain      Vision     Perception     Praxis      Pertinent Vitals/Pain Pain Assessment: 0-10 Pain Score: 9  Pain Location: Abdomen  Pain Descriptors / Indicators: Operative site guarding;Sharp Pain Intervention(s): Limited activity within patient's tolerance;Monitored during session;Repositioned     Hand Dominance Right   Extremity/Trunk Assessment Upper Extremity Assessment Upper Extremity Assessment: Overall WFL for tasks assessed   Lower Extremity Assessment Lower Extremity Assessment: Defer to PT evaluation  Communication Communication Communication: No difficulties   Cognition Arousal/Alertness: Awake/alert Behavior During Therapy: WFL for tasks assessed/performed Overall Cognitive Status: Within  Functional Limits for tasks assessed                     General Comments       Exercises       Shoulder Instructions      Home Living Family/patient expects to be discharged to:: Private residence Living Arrangements: Spouse/significant other;Parent Available Help at Discharge: Family;Available 24 hours/day Type of Home: House Home Access: Stairs to enter [employer] of Steps: 3 Entrance Stairs-Rails: None Home Layout: One level     Bathroom Shower/Tub: Tub/shower unit;Curtain Shower/tub characteristics: Architectural technologist: Standard     Home Equipment: None          Prior Functioning/Environment Level of Independence: Independent with assistive device(s)             OT Diagnosis: Generalized weakness;Acute pain   OT Problem List: Decreased strength;Decreased activity tolerance;Impaired balance (sitting and/or standing);Decreased safety awareness;Decreased knowledge of use of DME or AE;Decreased knowledge of precautions;Pain   OT Treatment/Interventions: Self-care/ADL training;DME and/or AE instruction;Therapeutic activities;Patient/family education    OT Goals(Current goals can be found in the care plan section) Acute Rehab OT Goals Patient Stated Goal: to have less pain  OT Goal Formulation: With patient Time For Goal Achievement: 09/10/16 Potential to Achieve Goals: Good ADL Goals Pt Will Perform Grooming: with min guard assist;standing Pt Will Perform Upper Body Bathing: with supervision;sitting Pt Will Perform Lower Body Bathing: with min guard assist;sit to/from stand;with adaptive equipment Pt Will Perform Upper Body Dressing: with supervision;sitting Pt Will Perform Lower Body Dressing: with min guard assist;with adaptive equipment;sit to/from stand Pt Will Transfer to Toilet: with min guard assist;ambulating;regular height toilet;bedside commode;grab bars Pt Will Perform Toileting - Clothing Manipulation and hygiene: with min  guard assist;sit to/from stand;with adaptive equipment Pt Will Perform Tub/Shower Transfer: Tub transfer;with min guard assist;ambulating;tub bench;rolling walker Additional ADL Goal #1: Pt will be independent with back precautions during ADLs.   OT Frequency: Min 2X/week   Barriers to D/C:            Co-evaluation PT/OT/SLP Co-Evaluation/Treatment: Yes Reason for Co-Treatment: For patient/therapist safety   OT goals addressed during session: Strengthening/ROM;ADL's and self-care      End of Session Equipment Utilized During Treatment: Rolling walker;Back brace Nurse Communication: Mobility status;Patient requests pain meds  Activity Tolerance: Patient limited by pain Patient left: in bed;with call bell/phone within reach;with family/visitor present   Time: [id number] OT Time Calculation (min): 25 min Charges:  OT General Charges $OT Visit: 1 Procedure OT Evaluation $OT Eval Moderate Complexity: 1 Procedure G-Codes:    Lucille Passy M 2016/09/21, 12:26 PM

## 2016-08-27 NOTE — Progress Notes (Addendum)
     Subjective: 1 Day Post-Op Procedure(s) (LRB): ANTERIOR LUMBAR FUSION L5-S1, EXCISION OF HERNIATED DISC LEFT L5-S1, ZERO P LUMBAR INTERBODY IMPLANT, LEFT ILIAC CREST BONE GRAFT  (N/A) ABDOMINAL EXPOSURE (N/A)Awake, alert and oriented x 4. Foley to d/ced today. PT and OT scheduled. Needs L-S with a left thigh lacer and drop lock left hip hinge.   Patient reports pain as marked.    Objective:   VITALS:  Temp:  [97.2 F (36.2 C)-100.1 F (37.8 C)] 98.7 F (37.1 C) (09/12 0625) Pulse Rate:  [62-90] 72 (09/12 0625) Resp:  [17-21] 17 (09/12 0625) BP: (100-140)/(54-81) 124/71 (09/12 0625) SpO2:  [92 %-100 %] 100 % (09/12 0625)  Neurologically intact ABD soft Neurovascular intact Sensation intact distally Intact pulses distally Dorsiflexion/Plantar flexion intact Incision: dressing C/D/I   LABS No results for input(s): HGB, WBC, PLT in the last 72 hours. No results for input(s): NA, K, CL, CO2, BUN, CREATININE, GLUCOSE in the last 72 hours. No results for input(s): LABPT, INR in the last 72 hours.   Assessment/Plan: 1 Day Post-Op Procedure(s) (LRB): ANTERIOR LUMBAR FUSION L5-S1, EXCISION OF HERNIATED DISC LEFT L5-S1, ZERO P LUMBAR INTERBODY IMPLANT, LEFT ILIAC CREST BONE GRAFT  (N/A) ABDOMINAL EXPOSURE (N/A)  Advance diet Up with therapy  DIscontinue foley today and mobilize  Start lovenox today. Adjust narcotics to help relieve discomfort, previous to admit on Oxycodone IR 20 mg every 6 hours. Start lovenox due to history of previous DVT and increased risk of TE disease. Brace today, needs order via ortho tech to [facility].  NITKA,JAMES E 08/27/2016, 7:47 AM

## 2016-08-27 NOTE — Evaluation (Signed)
Physical Therapy Evaluation Patient Details Name: [name] [name] MRN: [id number] DOB: [birth year] Today's Date: 08/27/2016   History of Present Illness  This 45 y.o. 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  PMH includes:  h/o PE, chronic back pain ; h/o back surgeries  Clinical Impression  Patient seen with OT and orthotist present for brace fitting.  She presents with decreased mobility with weakness in LE's and pain.  She will benefit from skilled PT in the acute setting to allow d/c home with family assist and follow up HHPT.     Follow Up Recommendations Home health PT;Supervision/Assistance - 24 hour    Equipment Recommendations  Rolling walker with 5" wheels    Recommendations for Other Services       Precautions / Restrictions Precautions Precautions: Back;Fall Precaution Booklet Issued: No Precaution Comments: Pt instructed in back precautions  Required Braces or Orthoses: Spinal Brace Spinal Brace: Thoracolumbosacral orthotic;Applied in sitting position (with L thigh extension and drop lock)      Mobility  Bed Mobility Overal bed mobility: Needs Assistance;+2 for physical assistance Bed Mobility: Rolling;Sidelying to Sit;Sit to Sidelying Rolling: Max assist Sidelying to sit: +2 for physical assistance;Max assist     Sit to sidelying: Max assist;+2 for physical assistance General bed mobility comments: Pt requires cues for seqencing and back precautions.  Assist to roll due to pain, and assist to move LEs off bed and to lift trunk; returned to supine due to orthotist needing to make adjustments on brace  Transfers Overall transfer level: Needs assistance Equipment used: Rolling walker (2 wheeled) Transfers: Sit to/from Stand Sit to Stand: +2 physical assistance;Mod assist Stand pivot transfers: Min assist;+2 physical assistance       General transfer comment: Pt requires increased time and assist to lift buttocks from bed.  Min a +2 for  transfer.  She requires increased time to extend knees and hips once in standing position   Ambulation/Gait Ambulation/Gait assistance: Min assist;+2 safety/equipment Ambulation Distance (Feet): 12 Feet Assistive device: Rolling walker (2 wheeled) Gait Pattern/deviations: Step-to pattern;Decreased stance time - left;Decreased step length - right;Antalgic;Trunk flexed     General Gait Details: assist to manage walker, pt kept eyes closed throughout  Stairs            Wheelchair Mobility    Modified Rankin (Stroke Patients Only)       Balance Overall balance assessment: Needs assistance Sitting-balance support: Bilateral upper extremity supported;No upper extremity supported Sitting balance-Leahy Scale: Poor Sitting balance - Comments: needs UE support, mainly due to pain   Standing balance support: Bilateral upper extremity supported Standing balance-Leahy Scale: Poor Standing balance comment: needs UE support and min A for balance                             Pertinent Vitals/Pain Pain Assessment: 0-10 Pain Score: 9  Pain Location: abdomen Pain Descriptors / Indicators: Operative site guarding Pain Intervention(s): Monitored during session;Limited activity within patient's tolerance;Repositioned    Home Living Family/patient expects to be discharged to:: Private residence Living Arrangements: Spouse/significant other;Parent Available Help at Discharge: Family;Available 24 hours/day Type of Home: House Home Access: Stairs to enter Entrance Stairs-Rails: None Entrance Stairs-Number of Steps: 3 Home Layout: One level Home Equipment: None      Prior Function Level of Independence: Independent with assistive device(s)         Comments: able to walk and continue working with pain, but stayed  in bed mostly at home     Hand Dominance   Dominant Hand: Right    Extremity/Trunk Assessment   Upper Extremity Assessment: Defer to OT evaluation            Lower Extremity Assessment: Generalized weakness      Cervical / Trunk Assessment: Other exceptions  Communication   Communication: No difficulties  Cognition Arousal/Alertness: Awake/alert Behavior During Therapy: WFL for tasks assessed/performed Overall Cognitive Status: Within Functional Limits for tasks assessed                      General Comments General comments (skin integrity, edema, etc.): Orthotist present for brace fitting in standing    Exercises        Assessment/Plan    PT Assessment Patient needs continued PT services  PT Diagnosis Acute pain;Difficulty walking   PT Problem List Decreased strength;Decreased mobility;Pain;Decreased knowledge of use of DME;Decreased activity tolerance;Decreased knowledge of precautions  PT Treatment Interventions DME instruction;Gait training;Stair training;Functional mobility training;Balance training;Therapeutic exercise;Therapeutic activities;Patient/family education   PT Goals (Current goals can be found in the Care Plan section) Acute Rehab PT Goals Patient Stated Goal: to have less pain  PT Goal Formulation: With patient Time For Goal Achievement: 08/31/16 Potential to Achieve Goals: Good    Frequency Min 5X/week   Barriers to discharge        Co-evaluation PT/OT/SLP Co-Evaluation/Treatment: Yes Reason for Co-Treatment: For patient/therapist safety PT goals addressed during session: Mobility/safety with mobility;Proper use of DME OT goals addressed during session: Strengthening/ROM;ADL's and self-care       End of Session Equipment Utilized During Treatment: Back brace Activity Tolerance: Patient limited by pain Patient left: in bed;with call bell/phone within reach           Time: 1130-1148 PT Time Calculation (min) (ACUTE ONLY): 18 min   Charges:   PT Evaluation $PT Eval Moderate Complexity: 1 Procedure     PT G CodesReginia Naas 11-Sep-2016, 1:08 PM  Magda Kiel,  [place] 11-Sep-2016

## 2016-08-27 NOTE — Care Management Note (Signed)
Case Management Note  Patient Details  Name: [name] [name] MRN: [id number] Date of Birth: [birth year]  Subjective/Objective:    Pt admitted s/p ANTERIOR LUMBAR FUSION L5-S1, EXCISION OF HERNIATED DISC LEFT L5-S1, ZERO P LUMBAR INTERBODY IMPLANT, LEFT ILIAC CREST BONE GRAFT                 Action/Plan:  Pt is a English as a second language teacher - CM contacted both [facility] and [place] [facility] to inform of admit and request HH/DME associated agencies.  CM will continue to follow for discharge needs   Expected Discharge Date:                  Expected Discharge Plan:     In-House Referral:     Discharge planning Services     Post Acute Care Choice:    Choice offered to:     DME Arranged:    DME Agency:     HH Arranged:    HH Agency:     Status of Service:     If discussed at [employer] of Stay Meetings, dates discussed:    Additional Comments: 08/27/2016 CM contacted by April with the [facility] confirmed that pt is 60% service connected with out additional resources.  Pt goes to [facility] for PCP.   Maryclare Labrador, RN 08/27/2016, 9:09 AM

## 2016-08-27 NOTE — Care Management Note (Addendum)
08/30/2016 1540 Pt received DME prior to dc. Contacted [facility] to make aware of dc home today. Contacted April, [place] with update on pt's dc today. Faxed op note, dc note, HH orders and facesheet to PCP, Dr Enedina Finner. Jonnie Finner RN CCM Case Mgmt phone [phone]  Case Management Note  Patient Details  Name: [name] [name] MRN: [id number] Date of Birth: 07-19-1971  Subjective/Objective:     ALF L5-S1               Action/Plan: Discharge Planning:  NCM spoke to [facility], [place], [place] ext [phone]. Requested DME order be faxed to [facility] fax [phone], phone # [phone] ext [phone]. They will arrange delivery of DME to hospital prior to dc or home. Pt states she lives at home with husband but is is disable. His assistance is limited. Her mother will assist as needed. Pt has two small children in the home age 22 and 28. She is requesting aide to assist with bathing and ADL. NCM contacted [facility] to make aware. Got approval to send [facility] referral to [facility]. Requested NCM send completed [place] orders to [facility] PCP # Dr. Sherian Maroon office, fax [phone].   PCP -  Dr Sherian Maroon   Expected Discharge Date:  9/15/017             Expected Discharge Plan:  [place]  In-House Referral:  NA  Discharge planning Services  CM Consult  Post Acute Care Choice:  Home Health Choice offered to:  Patient  DME Arranged:  3-N-1, Walker rolling, Tub bench DME Agency: [facility]   HH Arranged:  PT, Nurse's Aide [place] Agency:  [place]  Status of Service:  complete  If discussed at [place] of Stay Meetings, dates discussed:    Additional Comments:  Erenest Rasher, RN 08/27/2016, 4:08 PM

## 2016-08-27 NOTE — Progress Notes (Addendum)
[facility]  Subjective  - Nauseated.   Objective 124/71 72 98.7 F (37.1 C) (Oral) 17 100%  Intake/Output Summary (Last 24 hours) at 08/27/16 0727 Last data filed at 08/27/16 0700  Gross per 24 hour  Intake             3600 ml  Output             2078 ml  Net             1522 ml    Abdomin soft Dressing in place   Assessment/Planning: POD #1 Anterior abdominal spine exposure  Nausea is her biggest problem Pending tolerating PO's Disposition stable   [name] [name] 08/27/2016 7:27 AM --  Laboratory Lab Results: No results for input(s): WBC, HGB, HCT, PLT in the last 72 hours. BMET No results for input(s): NA, K, CL, CO2, GLUCOSE, BUN, CREATININE, CALCIUM in the last 72 hours.  COAG Lab Results  Component Value Date   INR 0.96 08/20/2016   INR 0.95 02/08/2016   INR 1.06 10/19/2012   No results found for: PTT

## 2016-08-28 NOTE — Progress Notes (Signed)
Patient ID: [name] [name], female   DOB: 1971/04/09, 44 y.o.   MRN: [id number] Reports left lower quadrant abdominal soreness. Dressing intact with mild tenderness. Unfortunately was not encouraged to walk yesterday. Will mobilize today per Dr.Nitka 2+ pedal pulses bilaterally. Will not follow actively. Please call if we can assist.

## 2016-08-28 NOTE — Progress Notes (Signed)
     Subjective: 2 Days Post-Op Procedure(s) (LRB): ANTERIOR LUMBAR FUSION L5-S1, EXCISION OF HERNIATED DISC LEFT L5-S1, ZERO P LUMBAR INTERBODY IMPLANT, LEFT ILIAC CREST BONE GRAFT  (N/A) ABDOMINAL EXPOSURE (N/A) Awake, alert and oriented x 4. Dressing changed with a lot of additional tape, painful to remove. Legs NV nl. Ambulated in the hallway today short distances.  No BM or Flatus. On lovenox, may use xarelto for short term anti DVT prophylaxis.  Patient reports pain as moderate.    Objective:   VITALS:  Temp:  [97.8 F (36.6 C)-98.4 F (36.9 C)] 97.8 F (36.6 C) (09/13 1300) Pulse Rate:  [81-94] 81 (09/13 1300) Resp:  [16-18] 16 (09/13 1300) BP: (100-106)/(59-65) 101/59 (09/13 1300) SpO2:  [97 %-100 %] 99 % (09/13 1300)  Neurologically intact ABD soft Neurovascular intact Sensation intact distally Intact pulses distally Dorsiflexion/Plantar flexion intact Incision: scant drainage   LABS  Recent Labs  08/27/16 0857  HGB 12.9  WBC 13.2*  PLT 284    Recent Labs  08/27/16 0857  NA 136  K 4.1  CL 105  CO2 23  BUN <5*  CREATININE 0.72  GLUCOSE 107*   No results for input(s): LABPT, INR in the last 72 hours.   Assessment/Plan: 2 Days Post-Op Procedure(s) (LRB): ANTERIOR LUMBAR FUSION L5-S1, EXCISION OF HERNIATED DISC LEFT L5-S1, ZERO P LUMBAR INTERBODY IMPLANT, LEFT ILIAC CREST BONE GRAFT  (N/A) ABDOMINAL EXPOSURE (N/A)  Advance diet Up with therapy  See how see does with PT/OT, possible d/c tomorrow but she is showing a lot of dependency, hospital bed, ?W/C and request for a walker with a seat. Plan it to return to independent ambulation without external assistance. Hospital bed is not necessary.  Will see how she does with continued PT.  Enrigue Hashimi E 08/28/2016, 6:29 PM

## 2016-08-28 NOTE — Progress Notes (Signed)
OT Cancellation Note  Patient Details Name: [name] [name] MRN: [id number] DOB: 04/21/1971   Cancelled Treatment:    Reason Eval/Treat Not Completed: Fatigue/lethargy limiting ability to participate;Pain limiting ability to participate.  Pt reports increased pain - just got back in bed.  Will try back later.   [place], OTR/L [id number]    Lucille Passy M 08/28/2016, 2:38 PM

## 2016-08-28 NOTE — Progress Notes (Signed)
Physical Therapy Treatment Patient Details Name: [name] [name] MRN: [id number] DOB: 07-06-1971 Today's Date: 08/28/2016    History of Present Illness This 45 y.o. 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  PMH includes:  h/o PE, chronic back pain ; h/o back surgeries    PT Comments    Pt remains guarded due to pain but motivated.  Please try stairs next session.  Also education needed for brace application at home.    Follow Up Recommendations  Home health PT;Supervision/Assistance - 24 hour     Equipment Recommendations  Rolling walker with 5" wheels    Recommendations for Other Services       Precautions / Restrictions Precautions Precautions: Back;Fall Precaution Booklet Issued: No Precaution Comments: Pt instructed in back precautions  Required Braces or Orthoses: Spinal Brace Spinal Brace: Thoracolumbosacral orthotic;Applied in sitting position (reports more pain in sitting, try to apply brace in supine.) Restrictions Weight Bearing Restrictions: No    Mobility  Bed Mobility Overal bed mobility: Needs Assistance   Rolling: Mod assist Sidelying to sit: Mod assist       General bed mobility comments: Cues for rolling prefers to roll to R side.  Required assist to advance LEs to edge of bed and assist to elevate trunk into sitting.  Transfers Overall transfer level: Needs assistance Equipment used: Rolling walker (2 wheeled) Transfers: Sit to/from Stand Sit to Stand: Mod assist;Min assist Stand pivot transfers: Min assist       General transfer comment: Cues for hand placement, require assist to lock brace in extension.  Cues for upper trunk control and knee extension.    Ambulation/Gait Ambulation/Gait assistance: Min assist Ambulation Distance (Feet): 60 Feet Assistive device: Rolling walker (2 wheeled) Gait Pattern/deviations: Step-through pattern;Shuffle;Trunk flexed;Antalgic     General Gait Details: Cues for upper trunk control and  RW safety.  Slow guarded movements.     Stairs            Wheelchair Mobility    Modified Rankin (Stroke Patients Only)       Balance Overall balance assessment: Needs assistance   Sitting balance-Leahy Scale: Poor Sitting balance - Comments: needs UE support, mainly due to pain     Standing balance-Leahy Scale: Poor                      Cognition Arousal/Alertness: Awake/alert Behavior During Therapy: WFL for tasks assessed/performed Overall Cognitive Status: Within Functional Limits for tasks assessed                      Exercises Total Joint Exercises Ankle Circles/Pumps: AROM;Both;10 reps Quad Sets: AROM;Both;10 reps Gluteal Sets: AROM;Both;10 reps    General Comments        Pertinent Vitals/Pain Pain Assessment: 0-10 Pain Score: 7  Pain Location: abdomen Pain Descriptors / Indicators: Grimacing;Guarding;Operative site guarding;Sore Pain Intervention(s): Monitored during session;Repositioned    Home Living                      Prior Function            PT Goals (current goals can now be found in the care plan section) Acute Rehab PT Goals Patient Stated Goal: to have less pain  Potential to Achieve Goals: Good Progress towards PT goals: Progressing toward goals    Frequency  Min 5X/week    PT Plan Current plan remains appropriate    Co-evaluation  End of Session Equipment Utilized During Treatment: Back brace;Gait belt Activity Tolerance: Patient tolerated treatment well Patient left: in bed;with call bell/phone within reach     Time: 1140-1215 PT Time Calculation (min) (ACUTE ONLY): 35 min  Charges:  $Gait Training: 8-22 mins $Therapeutic Activity: 8-22 mins                    G Codes:      Cristela Blue 09-15-2016, 6:40 PM  Governor Rooks, PTA pager [phone]

## 2016-08-29 LAB — BASIC METABOLIC PANEL
Anion gap: 5 (ref 5–15)
BUN: <5 mg/dL — ABNORMAL LOW (ref 6–20)
CHLORIDE: 107 mmol/L (ref 101–111)
CO2: 26 mmol/L (ref 22–32)
Calcium: 8.5 mg/dL — ABNORMAL LOW (ref 8.9–10.3)
Creatinine, Ser: 0.69 mg/dL (ref 0.44–1.00)
GFR calc non Af Amer: >60 mL/min (ref >60)
Glucose, Bld: 116 mg/dL — ABNORMAL HIGH (ref 65–99)
Potassium: 3.7 mmol/L (ref 3.5–5.1)
SODIUM: 138 mmol/L (ref 135–145)

## 2016-08-29 LAB — CBC
HCT: 37.8 % (ref 36.0–46.0)
HEMOGLOBIN: 12.6 g/dL (ref 12.0–15.0)
MCH: 30.7 pg (ref 26.0–34.0)
MCHC: 33.3 g/dL (ref 30.0–36.0)
MCV: 92 fL (ref 78.0–100.0)
PLATELETS: 267 10*3/uL (ref 150–400)
RBC: 4.11 MIL/uL (ref 3.87–5.11)
RDW: 12.7 % (ref 11.5–15.5)
WBC: 6.6 10*3/uL (ref 4.0–10.5)

## 2016-08-29 NOTE — Progress Notes (Signed)
Physical Therapy Treatment Patient Details Name: [name] [name] MRN: [id number] DOB: [birth year] Today's Date: 08/29/2016    History of Present Illness This 45 y.o. 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  PMH includes:  h/o PE, chronic back pain ; h/o back surgeries    PT Comments    Pt making gradual progress with mobility during PT sessions. During session pt able to ambulate 100 ft with rw and go up/down 2 stairs with HHA and rail. The patient's mobility remains guarded and slow but improving with activity tolerance. Anticipate D/C to home with HHPT services to follow. PT to continue to work with pt to improve independence while still in acute care. Pt denies any questions or concerns after PT session.   Follow Up Recommendations  Home health PT;Supervision/Assistance - 24 hour     Equipment Recommendations  Rolling walker with 5" wheels    Recommendations for Other Services       Precautions / Restrictions Precautions Precautions: Back;Fall Precaution Comments: reviewed back precautions Required Braces or Orthoses: Spinal Brace Spinal Brace: Thoracolumbosacral orthotic;Other (comment) Spinal Brace Comments: pt preference is to apply in bed Restrictions Weight Bearing Restrictions: No    Mobility  Bed Mobility Overal bed mobility: Needs Assistance   Rolling: Min guard Sidelying to sit: Mod assist       General bed mobility comments: Educating pt and spouse on best way and position to assist pt with side to sitting as well as brace application in bed.   Transfers Overall transfer level: Needs assistance Equipment used: Rolling walker (2 wheeled) Transfers: Sit to/from Stand Sit to Stand: Min assist         General transfer comment: min assist plus time, cues for full upright position and use of droplock on brace.   Ambulation/Gait Ambulation/Gait assistance: Min guard Ambulation Distance (Feet): 100 Feet Assistive device: Rolling walker (2  wheeled) Gait Pattern/deviations: Decreased step length - right;Decreased step length - left Gait velocity: slow pattern   General Gait Details: cues for posture provided as needed.    Stairs Stairs: Yes Stairs assistance: Min assist Stair Management: Step to pattern;Forwards;One rail Left Number of Stairs: 2 General stair comments: Attempted without rail to simulate main home entrance but pt unable to perform. Attempted stairs with rail and pt able to perform with HHA with minimal discomfort and good stability. Pt states that they can drive around to the side entrance which has a rail. Pt and spouse report feeling confident with performing stairs at home, denies needed to attempt any further steps at this time.   Wheelchair Mobility    Modified Rankin (Stroke Patients Only)       Balance Overall balance assessment: Needs assistance Sitting-balance support: Single extremity supported Sitting balance-Leahy Scale: Fair     Standing balance support: Bilateral upper extremity supported Standing balance-Leahy Scale: Poor Standing balance comment: using rw                    Cognition Arousal/Alertness: Awake/alert Behavior During Therapy: WFL for tasks assessed/performed Overall Cognitive Status: Within Functional Limits for tasks assessed                      Exercises      General Comments        Pertinent Vitals/Pain Pain Assessment: Faces Faces Pain Scale: Hurts even more Pain Location: Lt hip and back/abdomen Pain Descriptors / Indicators: Aching;Sore Pain Intervention(s): Limited activity within patient's tolerance;Monitored during session  Home Living                      Prior Function            PT Goals (current goals can now be found in the care plan section) Acute Rehab PT Goals Patient Stated Goal: move without pain again PT Goal Formulation: With patient Time For Goal Achievement: 08/31/16 Potential to Achieve Goals:  Good Progress towards PT goals: Progressing toward goals    Frequency  Min 5X/week    PT Plan Current plan remains appropriate    Co-evaluation             End of Session Equipment Utilized During Treatment: Back brace;Gait belt Activity Tolerance: Patient tolerated treatment well Patient left: in chair;with call bell/phone within reach     Time: 0907-1014 PT Time Calculation (min) (ACUTE ONLY): 67 min  Charges:  $Gait Training: 23-37 mins $Therapeutic Activity: 23-37 mins                    G Codes:      Cassell Clement, PT, CSCS Pager [phone] Office [phone]  08/29/2016, 11:30 AM

## 2016-08-29 NOTE — Progress Notes (Signed)
DME  NCM contacted [facility] to follow up on delivery. States they will plan to deliver DME to hospital this evening. Jonnie Finner RN CCM Case Mgmt phone [phone]

## 2016-08-29 NOTE — Progress Notes (Signed)
Occupational Therapy Treatment Patient Details Name: [name] [name] MRN: [id number] DOB: 17-Oct-1971 Today's Date: 08/29/2016    History of present illness This 45 y.o. 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  PMH includes:  h/o PE, chronic back pain ; h/o back surgeries   OT comments  Pt making progress with functional goals. Pt educated on ADL A/E for home use for LB ADLs and safety. OT will continue to follow acutely  Follow Up Recommendations  Home health OT;Supervision/Assistance - 24 hour    Equipment Recommendations  3 in 1 bedside comode;Tub/shower bench;Other (comment) (ADL A/E kit)    Recommendations for Other Services      Precautions / Restrictions Precautions Precautions: Back;Fall Precaution Comments: reviewed back precautions Required Braces or Orthoses: Spinal Brace Spinal Brace: Thoracolumbosacral orthotic Spinal Brace Comments: pt preference is to apply in bed Restrictions Weight Bearing Restrictions: No       Mobility Bed Mobility Overal bed mobility: Needs Assistance Bed Mobility: Rolling;Sit to Sidelying Rolling: Min guard Sidelying to sit: Mod assist     Sit to sidelying: +2 for physical assistance;Mod assist General bed mobility comments: assist with LEs onto bed  Transfers Overall transfer level: Needs assistance Equipment used: Rolling walker (2 wheeled) Transfers: Sit to/from Stand Sit to Stand: Min assist         General transfer comment: min assist plus time, cues for full upright position and use of droplock on brace.     Balance Overall balance assessment: Needs assistance Sitting-balance support: Single extremity supported Sitting balance-Leahy Scale: Fair     Standing balance support: Bilateral upper extremity supported Standing balance-Leahy Scale: Poor Standing balance comment: using rw                   ADL Overall ADL's : Needs assistance/impaired     Grooming: Wash/dry hands;Wash/dry face;Bed  level;Min guard   Upper Body Bathing: Min guard;Sitting Upper Body Bathing Details (indicate cue type and reason): simulated Lower Body Bathing: Maximal assistance;Sitting/lateral leans Lower Body Bathing Details (indicate cue type and reason): simulated Upper Body Dressing : Minimal assistance;Sitting   Lower Body Dressing: Total assistance   Toilet Transfer: BSC;RW;Minimal assistance   Toileting- Clothing Manipulation and Hygiene: Moderate assistance;Sit to/from Nurse, children's Details (indicate cue type and reason): declined tub bench transfer, however states that she will be getting a tub bench from [place] Functional mobility during ADLs: Minimal assistance;Rolling walker General ADL Comments: Pt educated on ADL A/E for home use as well as DME for bathroom      Vision  no change from baseline                              Cognition   Behavior During Therapy: WFL for tasks assessed/performed Overall Cognitive Status: Within Functional Limits for tasks assessed                       Extremity/Trunk Assessment   WFL                        General Comments  pt very pleasant and cooperative, very talkative    Pertinent Vitals/ Pain       Pain Assessment: 0-10 Pain Score: 8  Faces Pain Scale: Hurts even more Pain Location: back, pain meds given at start of session Pain Descriptors / Indicators: Aching;Sore Pain Intervention(s): Limited  activity within patient's tolerance;Monitored during session;RN gave pain meds during session;Repositioned  Home Living  at home with husband                                        Prior Functioning/Environment  independent           Frequency Min 2X/week     Progress Toward Goals  OT Goals(current goals can now be found in the care plan section)  Progress towards OT goals: Progressing toward goals  Acute Rehab OT Goals Patient Stated Goal: move without pain again   Plan Discharge plan remains appropriate                     End of Session Equipment Utilized During Treatment: Rolling walker;Back brace;Other (comment);Gait belt (BSC)   Activity Tolerance Patient limited by pain   Patient Left in bed;with call bell/phone within reach;with family/visitor present             Time: 1022-1101 OT Time Calculation (min): 39 min  Charges: OT General Charges $OT Visit: 1 Procedure OT Treatments $Self Care/Home Management : 23-37 mins $Therapeutic Activity: 8-22 mins  Britt Bottom 08/29/2016, 1:10 PM

## 2016-08-29 NOTE — Progress Notes (Signed)
     Subjective: 3 Days Post-Op Procedure(s) (LRB): ANTERIOR LUMBAR FUSION L5-S1, EXCISION OF HERNIATED DISC LEFT L5-S1, ZERO P LUMBAR INTERBODY IMPLANT, LEFT ILIAC CREST BONE GRAFT  (N/A) ABDOMINAL EXPOSURE (N/A)Awake,alert and oriented x 4. PT assisted ambulation into the hallway today, feeling better. [facility] +. [facility] delivering DME to hospital today.   Patient reports pain as moderate.    Objective:   VITALS:  Temp:  [98.1 F (36.7 C)-98.6 F (37 C)] 98.1 F (36.7 C) (09/14 2034) Pulse Rate:  [76-79] 79 (09/14 2034) Resp:  [16] 16 (09/14 2034) BP: (103-110)/(69-70) 103/69 (09/14 2034) SpO2:  [99 %-100 %] 100 % (09/14 2034)  Neurologically intact ABD soft Neurovascular intact Sensation intact distally Intact pulses distally Dorsiflexion/Plantar flexion intact Incision: no drainage No cellulitis present   LABS  Recent Labs  08/27/16 0857 08/29/16 0920  HGB 12.9 12.6  WBC 13.2* 6.6  PLT 284 267    Recent Labs  08/27/16 0857 08/29/16 0920  NA 136 138  K 4.1 3.7  CL 105 107  CO2 23 26  BUN <5* <5*  CREATININE 0.72 0.69  GLUCOSE 107* 116*   No results for input(s): LABPT, INR in the last 72 hours.   Assessment/Plan: 3 Days Post-Op Procedure(s) (LRB): ANTERIOR LUMBAR FUSION L5-S1, EXCISION OF HERNIATED DISC LEFT L5-S1, ZERO P LUMBAR INTERBODY IMPLANT, LEFT ILIAC CREST BONE GRAFT  (N/A) ABDOMINAL EXPOSURE (N/A)  Advance diet Up with therapy Plan for discharge tomorrow Discharge home with home health  NITKA,JAMES E 08/29/2016, 9:31 PM

## 2016-08-30 MED ORDER — OXYCODONE HCL 20 MG PO TABS: 20.0000 mg | ORAL_TABLET | ORAL | 0 refills | Status: DC | PRN

## 2016-08-30 MED ORDER — ASPIRIN EC 325 MG PO TBEC: 325.0000 mg | DELAYED_RELEASE_TABLET | Freq: Every day | ORAL | 0 refills | Status: DC

## 2016-08-30 MED ORDER — DOCUSATE SODIUM 100 MG PO CAPS: 100.0000 mg | ORAL_CAPSULE | Freq: Two times a day (BID) | ORAL | 1 refills | Status: DC

## 2016-08-30 MED ORDER — GABAPENTIN 300 MG PO CAPS: 300.0000 mg | ORAL_CAPSULE | Freq: Two times a day (BID) | ORAL | 6 refills | Status: DC

## 2016-08-30 MED ORDER — METHOCARBAMOL 500 MG PO TABS: 500.0000 mg | ORAL_TABLET | Freq: Four times a day (QID) | ORAL | 2 refills | Status: DC | PRN

## 2016-08-30 NOTE — Progress Notes (Signed)
Patient discharged to home, discharge instructions given, patient stated she understood 

## 2016-08-30 NOTE — Progress Notes (Signed)
Physical Therapy Treatment Patient Details Name: [name] [name] MRN: [id number] DOB: 1970-12-17 Today's Date: 08/30/2016    History of Present Illness This 45 y.o. 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  PMH includes:  h/o PE, chronic back pain ; h/o back surgeries    PT Comments    Patient is making gradual progress with PT.  From a mobility standpoint anticipate patient will be ready for DC home with family support. Following session, pt denies any questions or concerns. To continue with HHPT services upon D/C.      Follow Up Recommendations  Home health PT;Supervision/Assistance - 24 hour     Equipment Recommendations  Rolling walker with 5" wheels    Recommendations for Other Services       Precautions / Restrictions Precautions Precautions: Back;Fall Precaution Comments: reviewed back precautions Spinal Brace: Thoracolumbosacral orthotic Spinal Brace Comments: pt preference is to apply in bed Restrictions Weight Bearing Restrictions: No    Mobility  Bed Mobility Overal bed mobility: Needs Assistance Bed Mobility: Rolling;Sidelying to Sit Rolling: Supervision Sidelying to sit: Mod assist       General bed mobility comments: Reviewing logroll technique with husband and patient.   Transfers Overall transfer level: Needs assistance Equipment used: Rolling walker (2 wheeled) Transfers: Sit to/from Stand Sit to Stand: Min guard         General transfer comment: slow transition to standing and verbal/tactile cue for full erect standing posture.   Ambulation/Gait Ambulation/Gait assistance: Min guard Ambulation Distance (Feet): 85 Feet Assistive device: Rolling walker (2 wheeled) Gait Pattern/deviations: Step-through pattern;Decreased step length - left Gait velocity: slow pattern   General Gait Details: cues for posture, brace restricting Lt hip flexion   Stairs         General stair comments: pt reports feeling confident with  steps, declines attempting  Wheelchair Mobility    Modified Rankin (Stroke Patients Only)       Balance Overall balance assessment: Needs assistance Sitting-balance support: Single extremity supported Sitting balance-Leahy Scale: Fair     Standing balance support: No upper extremity supported Standing balance-Leahy Scale: Fair Standing balance comment: able to stand static without UE support                    Cognition Arousal/Alertness: Awake/alert Behavior During Therapy: WFL for tasks assessed/performed Overall Cognitive Status: Within Functional Limits for tasks assessed                      Exercises      General Comments General comments (skin integrity, edema, etc.): Reviewed donning/doffing brace with pt and spouse.       Pertinent Vitals/Pain Pain Assessment: 0-10 Pain Score: 4  Pain Location: lt hip (bone graft) Pain Descriptors / Indicators: Aching Pain Intervention(s): Limited activity within patient's tolerance;Monitored during session    Home Living                      Prior Function            PT Goals (current goals can now be found in the care plan section) Acute Rehab PT Goals Patient Stated Goal: get home PT Goal Formulation: With patient Time For Goal Achievement: 08/31/16 Potential to Achieve Goals: Good Progress towards PT goals: Progressing toward goals    Frequency    Min 5X/week      PT Plan Current plan remains appropriate    Co-evaluation  End of Session Equipment Utilized During Treatment: Back brace;Gait belt Activity Tolerance: Patient tolerated treatment well Patient left: in chair;with call bell/phone within reach;with family/visitor present     Time: [id number] PT Time Calculation (min) (ACUTE ONLY): 24 min  Charges:  $Gait Training: 8-22 mins $Therapeutic Activity: 8-22 mins                    G Codes:      Cassell Clement, PT, CSCS Pager [phone] Office  [phone]  08/30/2016, 2:43 PM

## 2016-08-30 NOTE — Progress Notes (Signed)
     Subjective: 4 Days Post-Op Procedure(s) (LRB): ANTERIOR LUMBAR FUSION L5-S1, EXCISION OF HERNIATED DISC LEFT L5-S1, ZERO P LUMBAR INTERBODY IMPLANT, LEFT ILIAC CREST BONE GRAFT  (N/A) ABDOMINAL EXPOSURE (N/A) Awake, alert and oriented x 4. Walking to bathroom with assistance. No IV, tolerating po nourishment and medications. Continue with PO oxycodone IR 20 po every 4-6 hours. Patient reports pain as mild.    Objective:   VITALS:  Temp:  [98.1 F (36.7 C)-98.6 F (37 C)] 98.1 F (36.7 C) (09/15 0402) Pulse Rate:  [74-79] 78 (09/15 0402) Resp:  [16] 16 (09/15 0402) BP: (95-114)/(59-78) 114/78 (09/15 0402) SpO2:  [99 %-100 %] 100 % (09/15 0402)  Neurologically intact ABD soft Neurovascular intact Sensation intact distally Intact pulses distally Dorsiflexion/Plantar flexion intact Incision: no drainage No cellulitis present   LABS  Recent Labs  08/27/16 0857 08/29/16 0920  HGB 12.9 12.6  WBC 13.2* 6.6  PLT 284 267    Recent Labs  08/27/16 0857 08/29/16 0920  NA 136 138  K 4.1 3.7  CL 105 107  CO2 23 26  BUN <5* <5*  CREATININE 0.72 0.69  GLUCOSE 107* 116*   No results for input(s): LABPT, INR in the last 72 hours.   Assessment/Plan: 4 Days Post-Op Procedure(s) (LRB): ANTERIOR LUMBAR FUSION L5-S1, EXCISION OF HERNIATED DISC LEFT L5-S1, ZERO P LUMBAR INTERBODY IMPLANT, LEFT ILIAC CREST BONE GRAFT  (N/A) ABDOMINAL EXPOSURE (N/A)  Advance diet Discharge home with home health  NITKA,JAMES E 08/30/2016, 8:00 AM

## 2016-09-05 NOTE — Discharge Summary (Signed)
Patient ID: [name] [name] MRN: [id number] DOB/AGE: 1971-04-18 45 y.o.  Admit date: 08/26/2016 Discharge date: 09/05/2016  Admission Diagnoses:  Active Problems:   HNP (herniated nucleus pulposus), lumbar   Disc disease, degenerative, lumbar or lumbosacral   Discharge Diagnoses:  Active Problems:   HNP (herniated nucleus pulposus), lumbar   Disc disease, degenerative, lumbar or lumbosacral  status post Procedure(s): ANTERIOR LUMBAR FUSION L5-S1, EXCISION OF HERNIATED DISC LEFT L5-S1, ZERO P LUMBAR INTERBODY IMPLANT, LEFT ILIAC CREST BONE GRAFT  ABDOMINAL EXPOSURE  Past Medical History:  Diagnosis Date  . Chronic back pain    stenosis and HNP  . History of pulmonary embolus (PE) 03/2008  . Pneumonia    "walking" in the 90's  . Urinary urgency   . Weakness    numbness and tingling on left side    Surgeries: Procedure(s): ANTERIOR LUMBAR FUSION L5-S1, EXCISION OF HERNIATED DISC LEFT L5-S1, ZERO P LUMBAR INTERBODY IMPLANT, LEFT ILIAC CREST BONE GRAFT  ABDOMINAL EXPOSURE on 08/26/2016   Consultants: Treatment Team:  Rosetta Posner, MD  Discharged Condition: Improved  Hospital Course: [name] [name] is an 45 y.o. female who was admitted 08/26/2016 for operative treatment of <principal problem not specified>. Patient failed conservative treatments (please see the history and physical for the specifics) and had severe unremitting pain that affects sleep, daily activities and work/hobbies. After pre-op clearance, the patient was taken to the operating room on 08/26/2016 and underwent  Procedure(s): ANTERIOR LUMBAR FUSION L5-S1, EXCISION OF HERNIATED DISC LEFT L5-S1, ZERO P LUMBAR INTERBODY IMPLANT, LEFT ILIAC CREST BONE GRAFT  ABDOMINAL EXPOSURE.    Patient was given perioperative antibiotics:  Anti-infectives    Start     Dose/Rate Route Frequency Ordered Stop   08/26/16 1600  ceFAZolin (ANCEF) IVPB 1 g/50 mL premix     1 g 100 mL/hr over 30 Minutes Intravenous Every  8 hours 08/26/16 1241 08/27/16 0025   08/26/16 0600  ceFAZolin (ANCEF) IVPB 2g/100 mL premix     2 g 200 mL/hr over 30 Minutes Intravenous To ShortStay Surgical 08/25/16 1505 08/26/16 0800       Patient was given sequential compression devices and early ambulation to prevent DVT.   Patient benefited maximally from hospital stay and there were no complications. At the time of discharge, the patient was urinating/moving their bowels without difficulty, tolerating a regular diet, pain is controlled with oral pain medications and they have been cleared by PT/OT.   Recent vital signs: No data found.    Recent laboratory studies: No results for input(s): WBC, HGB, HCT, PLT, NA, K, CL, CO2, BUN, CREATININE, GLUCOSE, INR, CALCIUM in the last 72 hours.  Invalid input(s): PT, 2   Discharge Medications:     Medication List    STOP taking these medications   predniSONE 20 MG tablet Commonly known as:  DELTASONE     TAKE these medications   aspirin EC 325 MG tablet Take 1 tablet (325 mg total) by mouth daily.   docusate sodium 100 MG capsule Commonly known as:  COLACE Take 1 capsule (100 mg total) by mouth 2 (two) times daily.   gabapentin 300 MG capsule Commonly known as:  NEURONTIN Take 1 capsule (300 mg total) by mouth 2 (two) times daily. Started 12/26/14, 1st day 1 capsule (300 mg) at bedtime, 2nd - 4th day: 1 capsule (300 mg) twice daily, 5th day and forward 1 capsule (300 mg) 3 times daily What changed:  Another medication with the same  name was added. Make sure you understand how and when to take each.   gabapentin 300 MG capsule Commonly known as:  NEURONTIN Take 1 capsule (300 mg total) by mouth 2 (two) times daily. What changed:  You were already taking a medication with the same name, and this prescription was added. Make sure you understand how and when to take each.   methocarbamol 500 MG tablet Commonly known as:  ROBAXIN Take 1 tablet (500 mg total) by mouth 2 (two)  times daily. What changed:  Another medication with the same name was added. Make sure you understand how and when to take each.   methocarbamol 500 MG tablet Commonly known as:  ROBAXIN Take 1 tablet (500 mg total) by mouth every 6 (six) hours as needed for muscle spasms. What changed:  You were already taking a medication with the same name, and this prescription was added. Make sure you understand how and when to take each.   Oxycodone HCl 20 MG Tabs Take 20 mg by mouth every 4 (four) hours as needed for pain. What changed:  Another medication with the same name was added. Make sure you understand how and when to take each.   Oxycodone HCl 20 MG Tabs Take 1 tablet (20 mg total) by mouth every 4 (four) hours as needed for severe pain. What changed:  You were already taking a medication with the same name, and this prescription was added. Make sure you understand how and when to take each.       Diagnostic Studies: Dg Chest 2 View  Result Date: 08/20/2016 CLINICAL DATA:  Lumbar surgery. EXAM: CHEST  2 VIEW COMPARISON:  06/12/2014 . FINDINGS: Mediastinum hilar structures normal. Calcified nodular density right lung base consistent granuloma. No pleural effusion or pneumothorax. Heart size normal. No acute bony abnormality. IMPRESSION: No acute cardiopulmonary disease. Electronically Signed   By: Marcello Moores  Register   On: 08/20/2016 10:56   Dg Lumbar Spine 2-3 Views  Result Date: 08/26/2016 CLINICAL DATA:  Status post L5-S1 80 L IF EXAM: DG C-ARM 61-120 MIN; LUMBAR SPINE - 2-3 VIEW COMPARISON:  MRI of the lumbar spine dated June 11, 2016. FINDINGS: AP and lateral fluoro spot images are reviewed. The reported fluoro time is 21 seconds. An anterior fusion device is present traversing the L5-S1 disc space. No immediate postprocedure complication is observed. IMPRESSION: Status post a PLIF at L5-S1 without evidence of immediate postprocedure complication. Electronically Signed   By: David  Martinique  M.D.   On: 08/26/2016 11:36   Dg C-arm 1-60 Min  Result Date: 08/26/2016 CLINICAL DATA:  Status post L5-S1 80 L IF EXAM: DG C-ARM 61-120 MIN; LUMBAR SPINE - 2-3 VIEW COMPARISON:  MRI of the lumbar spine dated June 11, 2016. FINDINGS: AP and lateral fluoro spot images are reviewed. The reported fluoro time is 21 seconds. An anterior fusion device is present traversing the L5-S1 disc space. No immediate postprocedure complication is observed. IMPRESSION: Status post a PLIF at L5-S1 without evidence of immediate postprocedure complication. Electronically Signed   By: David  Martinique M.D.   On: 08/26/2016 11:36   Dg Or Local Abdomen  Result Date: 08/26/2016 CLINICAL DATA:  L5-S1 ALIF.  Final instrument count. EXAM: OR LOCAL ABDOMEN COMPARISON:  Multiple previous lumbar an abdominal images is far back as June 2015 FINDINGS: Single view shows discectomy and fusion at L5-S1 with and interbody fusion device and paired L5 and S1 screws. There are 3 clips on the left related to the  retroperitoneal approach. No unexpected radio opacity. IMPRESSION: No unexpected finding following left retroperitoneal approach for discectomy and fusion at L5-S1. This result was called to the operating room by myself at 1130 hours. Electronically Signed   By: Nelson Chimes M.D.   On: 08/26/2016 11:32    Discharge Instructions    Call MD / Call 911    Complete by:  As directed    If you experience chest pain or shortness of breath, CALL 911 and be transported to the hospital emergency room.  If you develope a fever above 101 F, pus (white drainage) or increased drainage or redness at the wound, or calf pain, call your surgeon's office.   Constipation Prevention    Complete by:  As directed    Drink plenty of fluids.  Prune juice may be helpful.  You may use a stool softener, such as Colace (over the counter) 100 mg twice a day.  Use MiraLax (over the counter) for constipation as needed.   Diet - low sodium heart healthy    Complete  by:  As directed    Discharge instructions    Complete by:  As directed    Call if there is increasing drainage, fever greater than 101.5, severe head aches, and worsening nausea or light sensitivity. If shortness of breath, bloody cough or chest tightness or pain go to an emergency room. No lifting greater than 10 lbs. Avoid bending, stooping and twisting. Use brace when sitting and out of bed even to go to bathroom. Walk in house for first 2 weeks then may start to get out slowly increasing distances up to one half mile by 4-6 weeks post op. After 5 days may shower and change dressing following bathing with shower.When bathing remove the brace shower and replace brace before getting out of the shower. If drainage, keep dry dressing and do not bathe the incision, use an moisture impervious dressing. Please call and return for scheduled follow up appointment 2 weeks from the time of surgery.   Driving restrictions    Complete by:  As directed    No driving for 6 weeks   Increase activity slowly as tolerated    Complete by:  As directed    Lifting restrictions    Complete by:  As directed    No lifting for 12 weeks      Follow-up Information    Follow up Follow up in 2 week(s).        [facility] .   Why:  Home Health Physical Therapy and aide  Contact information: [place] [address] [phone]        Jessy Oto, MD. Schedule an appointment as soon as possible for a visit in 2 week(s).   Specialty:  Orthopedic Surgery Contact information: [place] [address] [phone]           Discharge Plan:  discharge to home  Disposition:     Signed: Lanae Crumbly  09/05/2016, 10:58 AM

## 2016-09-30 ENCOUNTER — Ambulatory Visit (INDEPENDENT_AMBULATORY_CARE_PROVIDER_SITE_OTHER): Payer: No Typology Code available for payment source | Admitting: Specialist

## 2016-09-30 DIAGNOSIS — M961 Postlaminectomy syndrome, not elsewhere classified: Secondary | ICD-10-CM

## 2016-09-30 DIAGNOSIS — M5116 Intervertebral disc disorders with radiculopathy, lumbar region: Secondary | ICD-10-CM

## 2016-10-09 ENCOUNTER — Ambulatory Visit (INDEPENDENT_AMBULATORY_CARE_PROVIDER_SITE_OTHER): Payer: No Typology Code available for payment source | Admitting: Specialist

## 2016-10-09 ENCOUNTER — Encounter (INDEPENDENT_AMBULATORY_CARE_PROVIDER_SITE_OTHER): Payer: Self-pay | Admitting: Specialist

## 2016-10-09 DIAGNOSIS — Z981 Arthrodesis status: Secondary | ICD-10-CM | POA: Insufficient documentation

## 2016-10-09 NOTE — Patient Instructions (Addendum)
Walking every day up to a mile, slowly increase walking every week, May useW ice or heat as needed for pain. Continue with oxycodone 10mg  tablet every 5 hours, slowly increase the time between doses to try and gradually come off these medications.  No lifting over 10 lbs. Avoid bending and stooping. Okay to drive for short periods 10-15 minutes. Continue with the brace for out bed and sitting. Return appointment in about 6 weeks.

## 2016-10-09 NOTE — Progress Notes (Signed)
Post-Op Visit Note   Patient: [name] [name]           Date of Birth: November 21, 1971           MRN: [id number] Visit Date: 10/09/2016 PCP: Buzzy Han, MD   Assessment & Plan:  Chief Complaint:  Patient returns 6 weeks post op from her Anterior lumbar fusion at L5-S1, excision of HNP Lt L5-S1 zero p lumbar interbody implant, Left iliac crest bone graft. Abdomen exposure. Patient states she is a 8-9 out of 10 on pain scale. Morning times are the worse. Wearing brace and ambulates with cane. States she is still having some low back pain and weakness in her left leg. Patient has some questions to her incision, states feels hard like something is still in there. Patient is currently taking oxycodone 10-325.  Visit Diagnoses:  1. S/P lumbar and lumbosacral fusion by anterior technique     Plan:Walking every day up to a mile, slowly increase walking every week, May useW ice or heat as needed for pain. Continue with oxycodone 10mg  tablet every 5 hours, slowly increase the time between doses to try and gradually come off these medications.  No lifting over 10 lbs. Avoid bending and stooping. Okay to drive for short periods 10-15 minutes. Continue with the brace for out bed and sitting. Return appointment in about 6 weeks  Follow-Up Instructions: Return in about 6 weeks (around 11/20/2016) for Post operative follow up..   Orders:  No orders of the defined types were placed in this encounter.  Meds ordered this encounter  Medications  . Oxycodone HCl 10 MG TABS    Sig: TAKE 1 TABLET BY MOUTH EVERY 6 HOURS FOR SEVERE PAIN, DO NOT TAKE WITH ANY OTHER NARCOTIC/OPIOID    Refill:  0  . cephALEXin (KEFLEX) 500 MG capsule    Sig: Take 500 mg by mouth 3 (three) times daily.    Refill:  0     PMFS History: Patient Active Problem List   Diagnosis Date Noted  . S/P lumbar and lumbosacral fusion by anterior technique 10/09/2016    Priority: High    Class: Status post  . Disc  disease, degenerative, lumbar or lumbosacral 08/26/2016    Priority: High    Class: Chronic  . Herniated nucleus pulposus, lumbar 12/10/2013    Priority: High  . Right lower quadrant pain 06/06/2014  . S/P hysterectomy 02/09/2013  . Pulmonary embolism ([place])   . Protein C deficiency ([place])   . Fibroids   . Endometriosis    Past Medical History:  Diagnosis Date  . Chronic back pain    stenosis and HNP  . History of pulmonary embolus (PE) 03/2008  . Pneumonia    "walking" in the 90's  . Urinary urgency   . Weakness    numbness and tingling on left side    No family history on file.  Past Surgical History:  Procedure Laterality Date  . ABDOMINAL EXPOSURE N/A 08/26/2016   Procedure: ABDOMINAL EXPOSURE;  Surgeon: Rosetta Posner, MD;  Location: [facility];  Service: Vascular;  Laterality: N/A;  . ABDOMINAL HYSTERECTOMY    . ANTERIOR LUMBAR FUSION N/A 08/26/2016   Procedure: ANTERIOR LUMBAR FUSION L5-S1, EXCISION OF HERNIATED DISC LEFT L5-S1, ZERO P LUMBAR INTERBODY IMPLANT, LEFT ILIAC CREST BONE GRAFT ;  Surgeon: Jessy Oto, MD;  Location: [facility];  Service: Orthopedics;  Laterality: N/A;  . BACK SURGERY    . DIAGNOSTIC LAPAROSCOPY  2004   x3  .  DILATION AND EVACUATION N/A 01/28/2013   Procedure: DILATATION AND EVACUATION;  Surgeon: Princess Bruins, MD;  Location: [facility];  Service: Gynecology;  Laterality: N/A;  . KNEE ARTHROSCOPY Left    x 3  . LUMBAR LAMINECTOMY N/A 12/10/2013   Procedure: LEFT L5-S1 MICRODISCECTOMY USING MIS;  Surgeon: Jessy Oto, MD;  Location: [facility];  Service: Orthopedics;  Laterality: N/A;  . LUMBAR LAMINECTOMY N/A 12/30/2014   Procedure: LEFT L5-S1 Redo MICRODISCECTOMY;  Surgeon: Jessy Oto, MD;  Location: [facility];  Service: Orthopedics;  Laterality: N/A;  . ROBOTIC ASSISTED TOTAL HYSTERECTOMY N/A 02/09/2013   Procedure: ROBOTIC ASSISTED TOTAL HYSTERECTOMY;  Surgeon: Princess Bruins, MD;  Location: [facility];  Service: Gynecology;  Laterality: N/A;  . UNILATERAL  SALPINGECTOMY Right 02/09/2013   Procedure: UNILATERAL SALPINGECTOMY;  Surgeon: Princess Bruins, MD;  Location: [facility];  Service: Gynecology;  Laterality: Right;  . WISDOM TOOTH EXTRACTION     Social History   Occupational History  . Not on file.   Social History Main Topics  . Smoking status: Current Every Day Smoker    Packs/day: 0.50    Years: 25.00    Types: Cigarettes  . Smokeless tobacco: Never Used  . Alcohol use Yes  . Drug use: No  . Sexual activity: Not Currently    Birth control/ protection: Surgical   0

## 2016-10-15 ENCOUNTER — Telehealth (INDEPENDENT_AMBULATORY_CARE_PROVIDER_SITE_OTHER): Payer: Self-pay | Admitting: Specialist

## 2016-10-15 NOTE — Telephone Encounter (Signed)
Patient requesting RX refill Oxycodone 10 mg. Patient will be out of meds on 10/16/2016  Contact Info: [phone]

## 2016-10-16 MED ORDER — OXYCODONE HCL 10 MG PO TABS: ORAL_TABLET | ORAL | 0 refills | Status: DC

## 2016-10-16 NOTE — Telephone Encounter (Signed)
Ok to refill 

## 2016-10-16 NOTE — Telephone Encounter (Signed)
Called patient and advised RX ready to be picked up at front desk. 

## 2016-10-17 ENCOUNTER — Telehealth (INDEPENDENT_AMBULATORY_CARE_PROVIDER_SITE_OTHER): Payer: Self-pay | Admitting: Specialist

## 2016-10-17 NOTE — Telephone Encounter (Signed)
Allen @ [facility] request a urgent call back regarding Rx for Oxycodone that pt brought in today to ger refilled. Pharmacy states the Rx is a week early

## 2016-10-17 NOTE — Telephone Encounter (Signed)
Spoke with Dr Louanne Skye he gave authorization ok to refill. Called pharmacy and advised per Dr. Louanne Skye ok to fill early. Thanks. Nira Conn

## 2016-10-28 ENCOUNTER — Telehealth (INDEPENDENT_AMBULATORY_CARE_PROVIDER_SITE_OTHER): Payer: Self-pay | Admitting: Specialist

## 2016-10-28 MED ORDER — OXYCODONE HCL 10 MG PO TABS: ORAL_TABLET | ORAL | 0 refills | Status: DC

## 2016-10-28 NOTE — Telephone Encounter (Signed)
Requesting refill

## 2016-10-28 NOTE — Telephone Encounter (Signed)
Patient called needing Rx refilled (oxycodone)  Patient said she only rec'd 10 days worth.   The number to contact her is [phone]

## 2016-10-29 NOTE — Telephone Encounter (Signed)
appt made now

## 2016-10-29 NOTE — Telephone Encounter (Signed)
IC pt and advised Rx ready for pickup. Also discussed weaning off meds.

## 2016-10-29 NOTE — Telephone Encounter (Signed)
Patient says that there is a lump/swelling at incision, I sched her for a sooner appt.  Need to open time at 930am Thursday with JN.

## 2016-10-31 ENCOUNTER — Ambulatory Visit (INDEPENDENT_AMBULATORY_CARE_PROVIDER_SITE_OTHER): Payer: No Typology Code available for payment source | Admitting: Specialist

## 2016-11-11 ENCOUNTER — Telehealth (INDEPENDENT_AMBULATORY_CARE_PROVIDER_SITE_OTHER): Payer: Self-pay | Admitting: Specialist

## 2016-11-11 NOTE — Telephone Encounter (Signed)
REQUESTING PAIN MEDICATION REFILL    Cb# [phone]

## 2016-11-11 NOTE — Telephone Encounter (Signed)
Please advise on rx.

## 2016-11-12 MED ORDER — OXYCODONE HCL 5 MG PO CAPS: 5.0000 mg | ORAL_CAPSULE | Freq: Four times a day (QID) | ORAL | 0 refills | Status: DC | PRN

## 2016-11-13 MED ORDER — OXYCODONE HCL 5 MG PO CAPS: 5.0000 mg | ORAL_CAPSULE | Freq: Four times a day (QID) | ORAL | 0 refills | Status: DC | PRN

## 2016-11-13 NOTE — Telephone Encounter (Signed)
RX written and patient aware ready to be picked up at front desk  Thanks. Nira Conn

## 2016-11-13 NOTE — Telephone Encounter (Signed)
Can you please print again, did not receive  Thanks.

## 2016-11-13 NOTE — Addendum Note (Signed)
Addended by: Basil Dess on: 11/13/2016 03:48 PM   Modules accepted: Orders

## 2016-11-21 ENCOUNTER — Encounter (INDEPENDENT_AMBULATORY_CARE_PROVIDER_SITE_OTHER): Payer: Self-pay | Admitting: Specialist

## 2016-11-21 ENCOUNTER — Telehealth (INDEPENDENT_AMBULATORY_CARE_PROVIDER_SITE_OTHER): Payer: Self-pay | Admitting: Specialist

## 2016-11-21 ENCOUNTER — Ambulatory Visit (INDEPENDENT_AMBULATORY_CARE_PROVIDER_SITE_OTHER): Payer: No Typology Code available for payment source

## 2016-11-21 ENCOUNTER — Ambulatory Visit (INDEPENDENT_AMBULATORY_CARE_PROVIDER_SITE_OTHER): Payer: No Typology Code available for payment source | Admitting: Specialist

## 2016-11-21 VITALS — BP 118/83 | HR 79 | Ht 67.0 in | Wt 162.0 lb

## 2016-11-21 DIAGNOSIS — M5441 Lumbago with sciatica, right side: Secondary | ICD-10-CM | POA: Diagnosis not present

## 2016-11-21 DIAGNOSIS — M5442 Lumbago with sciatica, left side: Secondary | ICD-10-CM | POA: Diagnosis not present

## 2016-11-21 DIAGNOSIS — G8929 Other chronic pain: Secondary | ICD-10-CM

## 2016-11-21 MED ORDER — OXYCODONE-ACETAMINOPHEN 5-325 MG PO TABS: 1.0000 | ORAL_TABLET | Freq: Four times a day (QID) | ORAL | 0 refills | Status: DC | PRN

## 2016-11-21 NOTE — Telephone Encounter (Signed)
Dr. Louanne Skye did not tell pt if she needs a follow up and it wasn't put in epic. If she does can you please call the pt?

## 2016-11-21 NOTE — Patient Instructions (Addendum)
Avoid frequent bending and stooping  No lifting greater than 10 lbs. May use ice or moist heat for pain. Weight loss is of benefit. Handicap license is approved. Now 3 months post surgery, xrays show healed anterior lumbar fusion. Recommend continuing to wean from narcotic pain medications. Wean from brace and will contact her primary care MD to discuss options concerning a slow wean or detoxification program from chronic narcotic medications.

## 2016-11-21 NOTE — Telephone Encounter (Signed)
Please see today's note, it indicates in 4 weeks, she should do therapy and we will work on referral to behavorial health to work on medication taper, I spoke with her primary care at the [facility] Dr. Jenetta Downer and she is aware of the referral and concurrs. jen

## 2016-11-21 NOTE — Telephone Encounter (Signed)
When do you want patient to return for recheck

## 2016-11-21 NOTE — Progress Notes (Signed)
Post-Op Visit Note   Patient: [name] [name]           Date of Birth: 06/25/1971           MRN: [id number] Visit Date: 11/21/2016 PCP: Buzzy Han, MD   Assessment & Plan:Now 3 months post surgery, xrays show healed anterior lumbar fusion. Recommend continuing to wean from narcotic pain medications. Wean from brace and will contact her primary care MD to discuss options concerning a slow wean or detoxification program from chronic narcotic medications.   Chief Complaint:  Patient is retuning now 3 months post op from anterior lumbar fusion L5-S1, excision of herniated disc left L5-S1. Lumbar interbody implant, left iliac crest bone graft. States the left incision still has a knot. Low back pain is constant, some days cant even get out of the bed. Still having an aid come out to help her bathe. Taking the oxycodone 5 mg. States she can not deal with taking only this. Has to take 2 at a time to get any relief. When can I expect relief from the surgery. Wearing brace all the time. Mornings are worse, severe pain and stiffness. Ambulates with cane. Did therapy in her home for 3 weeks and that was it. She is trying to walk but can only walk for 30-40 min then left foot starts to swell. Left foot still don't have much feeling.  Visit Diagnoses: No diagnosis found.  Plan: Avoid frequent bending and stooping  No lifting greater than 10 lbs. May use ice or moist heat for pain. Weight loss is of benefit. Handicap license is approved.   Follow-Up Instructions: No Follow-up on file.   Orders:  No orders of the defined types were placed in this encounter.  No orders of the defined types were placed in this encounter.    PMFS History: Patient Active Problem List   Diagnosis Date Noted  . S/P lumbar and lumbosacral fusion by anterior technique 10/09/2016    Class: Status post  . Disc disease, degenerative, lumbar or lumbosacral 08/26/2016    Class: Chronic  . Right lower  quadrant pain 06/06/2014  . Herniated nucleus pulposus, lumbar 12/10/2013  . S/P hysterectomy 02/09/2013  . Pulmonary embolism ([place])   . Protein C deficiency ([place])   . Fibroids   . Endometriosis    Past Medical History:  Diagnosis Date  . Chronic back pain    stenosis and HNP  . History of pulmonary embolus (PE) 03/2008  . Pneumonia    "walking" in the 90's  . Urinary urgency   . Weakness    numbness and tingling on left side    No family history on file.  Past Surgical History:  Procedure Laterality Date  . ABDOMINAL EXPOSURE N/A 08/26/2016   Procedure: ABDOMINAL EXPOSURE;  Surgeon: Rosetta Posner, MD;  Location: [facility];  Service: Vascular;  Laterality: N/A;  . ABDOMINAL HYSTERECTOMY    . ANTERIOR LUMBAR FUSION N/A 08/26/2016   Procedure: ANTERIOR LUMBAR FUSION L5-S1, EXCISION OF HERNIATED DISC LEFT L5-S1, ZERO P LUMBAR INTERBODY IMPLANT, LEFT ILIAC CREST BONE GRAFT ;  Surgeon: Jessy Oto, MD;  Location: [facility];  Service: Orthopedics;  Laterality: N/A;  . BACK SURGERY    . DIAGNOSTIC LAPAROSCOPY  2004   x3  . DILATION AND EVACUATION N/A 01/28/2013   Procedure: DILATATION AND EVACUATION;  Surgeon: Princess Bruins, MD;  Location: [facility];  Service: Gynecology;  Laterality: N/A;  . KNEE ARTHROSCOPY Left    x 3  .  LUMBAR LAMINECTOMY N/A 12/10/2013   Procedure: LEFT L5-S1 MICRODISCECTOMY USING MIS;  Surgeon: Jessy Oto, MD;  Location: [facility];  Service: Orthopedics;  Laterality: N/A;  . LUMBAR LAMINECTOMY N/A 12/30/2014   Procedure: LEFT L5-S1 Redo MICRODISCECTOMY;  Surgeon: Jessy Oto, MD;  Location: [facility];  Service: Orthopedics;  Laterality: N/A;  . ROBOTIC ASSISTED TOTAL HYSTERECTOMY N/A 02/09/2013   Procedure: ROBOTIC ASSISTED TOTAL HYSTERECTOMY;  Surgeon: Princess Bruins, MD;  Location: [facility];  Service: Gynecology;  Laterality: N/A;  . UNILATERAL SALPINGECTOMY Right 02/09/2013   Procedure: UNILATERAL SALPINGECTOMY;  Surgeon: Princess Bruins, MD;  Location: [facility];   Service: Gynecology;  Laterality: Right;  . WISDOM TOOTH EXTRACTION     Social History   Occupational History  . Not on file.   Social History Main Topics  . Smoking status: Current Every Day Smoker    Packs/day: 0.50    Years: 25.00    Types: Cigarettes  . Smokeless tobacco: Never Used  . Alcohol use Yes  . Drug use: No  . Sexual activity: Not Currently    Birth control/ protection: Surgical

## 2016-11-21 NOTE — Telephone Encounter (Incomplete)
When does patient need to retun

## 2016-11-22 NOTE — Telephone Encounter (Signed)
Scheduled for 1/4 at 10:15

## 2016-11-26 ENCOUNTER — Telehealth: Payer: Self-pay | Admitting: Physical Therapy

## 2016-11-26 NOTE — Telephone Encounter (Signed)
11/21/16 & 11/26/16 left message to call, no return call

## 2016-11-28 ENCOUNTER — Encounter (INDEPENDENT_AMBULATORY_CARE_PROVIDER_SITE_OTHER): Payer: Self-pay | Admitting: *Deleted

## 2016-11-28 NOTE — Telephone Encounter (Signed)
11/21/16 & 11/26/16 left message to call to schedule PT eval. No return call

## 2016-12-06 ENCOUNTER — Telehealth (INDEPENDENT_AMBULATORY_CARE_PROVIDER_SITE_OTHER): Payer: Self-pay | Admitting: Specialist

## 2016-12-06 DIAGNOSIS — G8929 Other chronic pain: Secondary | ICD-10-CM

## 2016-12-06 DIAGNOSIS — M5442 Lumbago with sciatica, left side: Principal | ICD-10-CM

## 2016-12-06 DIAGNOSIS — M5441 Lumbago with sciatica, right side: Principal | ICD-10-CM

## 2016-12-06 MED ORDER — OXYCODONE-ACETAMINOPHEN 5-325 MG PO TABS: 1.0000 | ORAL_TABLET | Freq: Two times a day (BID) | ORAL | 0 refills | Status: DC

## 2016-12-06 NOTE — Telephone Encounter (Signed)
Can you advise for Dr. Nitka? 

## 2016-12-06 NOTE — Telephone Encounter (Signed)
FYI.Marland KitchenMarland KitchenDr. Lorin Mercy called and spoke with patient at length about it being time to discontinue the pain medication. He advised that he would write for ten tablets and that this would be the last narcotic pain medication she would get from the office.

## 2016-12-06 NOTE — Telephone Encounter (Signed)
Will you see if Dr. Lorin Mercy will advise since Dr. Louanne Skye in surgery.  Thanks. [name] [name]

## 2016-12-06 NOTE — Telephone Encounter (Signed)
Pt requesting refill of oxycodone. She said she gets 10 day supplies. Pt number is [phone] she stated she needs this today

## 2016-12-06 NOTE — Telephone Encounter (Signed)
Percocet 5/325  # 10 tabs  1 po bid. This is her last narcotic Rx from [place]. Time to stop.

## 2016-12-11 ENCOUNTER — Telehealth (INDEPENDENT_AMBULATORY_CARE_PROVIDER_SITE_OTHER): Payer: Self-pay | Admitting: Specialist

## 2016-12-11 NOTE — Telephone Encounter (Signed)
Patient called asked if Dr Louanne Skye can give her a call concerning trying to wean off her medication. Patient said she is hurting pretty bad from her back all the way down to her foot. Patient said she is trying taking tylenol but it is not working. The number to contact patient is [phone]

## 2016-12-12 NOTE — Telephone Encounter (Signed)
Please advise 

## 2016-12-12 NOTE — Telephone Encounter (Signed)
Nothing I can do about this one.  Needs to be held for Dr. Louanne Skye

## 2016-12-12 NOTE — Telephone Encounter (Signed)
Thanks for the note.jen

## 2016-12-19 ENCOUNTER — Ambulatory Visit (INDEPENDENT_AMBULATORY_CARE_PROVIDER_SITE_OTHER): Payer: No Typology Code available for payment source | Admitting: Specialist

## 2016-12-20 NOTE — Telephone Encounter (Signed)
3 months following surgery it is time to stop narcotics or to consider a detox program to come off narcotics. jen

## 2016-12-23 NOTE — Telephone Encounter (Signed)
See below from [place]

## 2016-12-26 NOTE — Telephone Encounter (Signed)
Patient has been advised of message and hse has been sched for 12/16/2016 @ 845

## 2016-12-27 ENCOUNTER — Encounter (INDEPENDENT_AMBULATORY_CARE_PROVIDER_SITE_OTHER): Payer: Self-pay | Admitting: Specialist

## 2016-12-27 ENCOUNTER — Ambulatory Visit (INDEPENDENT_AMBULATORY_CARE_PROVIDER_SITE_OTHER): Payer: Non-veteran care | Admitting: Specialist

## 2016-12-27 VITALS — BP 102/75 | HR 80 | Ht 67.0 in | Wt 162.0 lb

## 2016-12-27 DIAGNOSIS — M5416 Radiculopathy, lumbar region: Secondary | ICD-10-CM

## 2016-12-27 DIAGNOSIS — M5136 Other intervertebral disc degeneration, lumbar region: Secondary | ICD-10-CM

## 2016-12-27 DIAGNOSIS — Z981 Arthrodesis status: Secondary | ICD-10-CM | POA: Diagnosis not present

## 2016-12-27 DIAGNOSIS — M961 Postlaminectomy syndrome, not elsewhere classified: Secondary | ICD-10-CM

## 2016-12-27 MED ORDER — TRAMADOL-ACETAMINOPHEN 37.5-325 MG PO TABS: 1.0000 | ORAL_TABLET | Freq: Four times a day (QID) | ORAL | 0 refills | Status: DC | PRN

## 2016-12-27 NOTE — Progress Notes (Signed)
Office Visit Note   Patient: [name] [name]           Date of Birth: Apr 21, 1971           MRN: [id number] Visit Date: 12/27/2016              Requested by: Buzzy Han, MD [address] [facility] [place] , [place] [address] PCP: Buzzy Han, MD   Assessment & Plan: Visit Diagnoses:  1. Status post lumbar spinal fusion   2. Chronic left lumbar radiculopathy   3. Degenerative disc disease, lumbar   4. Lumbar post-laminectomy syndrome     Plan:  Avoid frequent bending and stooping  No lifting greater than 15 lbs. May use ice or moist heat for pain. Weight loss is of benefit. Handicap license is approved.   Follow-Up Instructions: Return in about 4 weeks (around 01/24/2017).   Orders:  No orders of the defined types were placed in this encounter.  No orders of the defined types were placed in this encounter.     Procedures: No procedures performed   Clinical Data: No additional findings.   Subjective: Chief Complaint  Patient presents with  . Lower Back - Pain, Follow-up    [name] [name] is here for 4 month follow up from anterior lumbar fusion L5-S1, excision of Herniated disc left L5-S1, Zero lumbar ineterbody implant with left iliac bone graft.  She is ambulating with a cane today.  She states that the graft sight has a knot on it and it is painful, also she has pain at the base of her spine. Her left foot is numb and it drags when she walk. She uses the cane for balance, she can not wear a regular tennis shoe, she is wearing flip-flops.    Review of Systems  Constitutional: Negative.   HENT: Negative.   Eyes: Negative.   Respiratory: Negative.   Cardiovascular: Negative.   Gastrointestinal: Negative.   Endocrine: Negative.   Genitourinary: Negative.   Musculoskeletal: Negative.   Skin: Negative.   Allergic/Immunologic: Negative.   Neurological: Negative.   Hematological: Negative.   Psychiatric/Behavioral:  Negative.      Objective: Vital Signs: BP 102/75 (BP Location: Left Arm, Patient Position: Sitting)   Pulse 80   Ht 5\' 7"  (1.702 m)   Wt 162 lb (73.5 kg)   BMI 25.37 kg/m   Physical Exam  Constitutional: She is oriented to person, place, and time. She appears well-developed and well-nourished.  HENT:  Head: Normocephalic and atraumatic.  Eyes: EOM are normal. Pupils are equal, round, and reactive to light.  Neck: Normal range of motion. Neck supple.  Pulmonary/Chest: Effort normal and breath sounds normal.  Abdominal: Soft. Bowel sounds are normal.  Musculoskeletal:       Right knee: She exhibits effusion.  Neurological: She is alert and oriented to person, place, and time.  Skin: Skin is warm and dry.  Psychiatric: She has a normal mood and affect. Her behavior is normal. Judgment and thought content normal.    Right Knee Exam   Tenderness  The patient is experiencing tenderness in the patella and patellar tendon.  Range of Motion  Extension: normal  Flexion: normal   Muscle Strength   The patient has normal right knee strength.  Tests  McMurray:  Medial - positive  Lachman:  Anterior - negative    Posterior - negative Drawer:       Anterior - negative    Posterior - negative Varus: negative Valgus: negative  Other  Erythema: absent Scars: present Sensation: normal Pulse: present Other tests: effusion present   Back Exam   Tenderness  The patient is experiencing tenderness in the lumbar.  Range of Motion  Extension: normal  Flexion: 60  Lateral Bend Right: abnormal  Lateral Bend Left: abnormal  Rotation Right: abnormal  Rotation Left: abnormal   Muscle Strength  Right Quadriceps:  5/5  Left Quadriceps:  5/5  Right Hamstrings:  5/5  Left Hamstrings:  4/5   Tests  Straight leg raise right: negative Straight leg raise left: negative  Reflexes  Patellar: normal Achilles:  0/4 abnormal Babinski's sign: normal   Other  Toe Walk:  normal Heel Walk: normal Sensation: decreased Gait: abnormal  Erythema: no back redness Scars: absent      Specialty Comments:  No specialty comments available.  Imaging: No results found.   PMFS History: Patient Active Problem List   Diagnosis Date Noted  . S/P lumbar and lumbosacral fusion by anterior technique 10/09/2016    Priority: High    Class: Status post  . Disc disease, degenerative, lumbar or lumbosacral 08/26/2016    Priority: High    Class: Chronic  . Herniated nucleus pulposus, lumbar 12/10/2013    Priority: High  . Right lower quadrant pain 06/06/2014  . S/P hysterectomy 02/09/2013  . Pulmonary embolism ([place])   . Protein C deficiency ([place])   . Fibroids   . Endometriosis    Past Medical History:  Diagnosis Date  . Chronic back pain    stenosis and HNP  . History of pulmonary embolus (PE) 03/2008  . Pneumonia    "walking" in the 90's  . Urinary urgency   . Weakness    numbness and tingling on left side    No family history on file.  Past Surgical History:  Procedure Laterality Date  . ABDOMINAL EXPOSURE N/A 08/26/2016   Procedure: ABDOMINAL EXPOSURE;  Surgeon: Rosetta Posner, MD;  Location: [place];  Service: Vascular;  Laterality: N/A;  . ABDOMINAL HYSTERECTOMY    . ANTERIOR LUMBAR FUSION N/A 08/26/2016   Procedure: ANTERIOR LUMBAR FUSION L5-S1, EXCISION OF HERNIATED DISC LEFT L5-S1, ZERO P LUMBAR INTERBODY IMPLANT, LEFT ILIAC CREST BONE GRAFT ;  Surgeon: Jessy Oto, MD;  Location: [place];  Service: Orthopedics;  Laterality: N/A;  . BACK SURGERY    . DIAGNOSTIC LAPAROSCOPY  2004   x3  . DILATION AND EVACUATION N/A 01/28/2013   Procedure: DILATATION AND EVACUATION;  Surgeon: Princess Bruins, MD;  Location: [facility];  Service: Gynecology;  Laterality: N/A;  . KNEE ARTHROSCOPY Left    x 3  . LUMBAR LAMINECTOMY N/A 12/10/2013   Procedure: LEFT L5-S1 MICRODISCECTOMY USING MIS;  Surgeon: Jessy Oto, MD;  Location: [place];  Service: Orthopedics;   Laterality: N/A;  . LUMBAR LAMINECTOMY N/A 12/30/2014   Procedure: LEFT L5-S1 [place] MICRODISCECTOMY;  Surgeon: Jessy Oto, MD;  Location: [place];  Service: Orthopedics;  Laterality: N/A;  . ROBOTIC ASSISTED TOTAL HYSTERECTOMY N/A 02/09/2013   Procedure: ROBOTIC ASSISTED TOTAL HYSTERECTOMY;  Surgeon: Princess Bruins, MD;  Location: [facility];  Service: Gynecology;  Laterality: N/A;  . UNILATERAL SALPINGECTOMY Right 02/09/2013   Procedure: UNILATERAL SALPINGECTOMY;  Surgeon: Princess Bruins, MD;  Location: [facility];  Service: Gynecology;  Laterality: Right;  . WISDOM TOOTH EXTRACTION     Social History   Occupational History  . Not on file.   Social History Main Topics  . Smoking status: Current Every Day Smoker  Packs/day: 0.50    Years: 25.00    Types: Cigarettes  . Smokeless tobacco: Never Used  . Alcohol use Yes  . Drug use: No  . Sexual activity: Not Currently    Birth control/ protection: Surgical

## 2016-12-27 NOTE — Patient Instructions (Signed)
Avoid frequent bending and stooping  No lifting greater than 15 lbs. May use ice or moist heat for pain. Weight loss is of benefit. Handicap license is approved.

## 2017-01-21 ENCOUNTER — Telehealth: Payer: Self-pay | Admitting: Physical Therapy

## 2017-01-21 ENCOUNTER — Encounter (INDEPENDENT_AMBULATORY_CARE_PROVIDER_SITE_OTHER): Payer: Self-pay | Admitting: *Deleted

## 2017-01-27 ENCOUNTER — Telehealth (INDEPENDENT_AMBULATORY_CARE_PROVIDER_SITE_OTHER): Payer: Self-pay | Admitting: Specialist

## 2017-01-27 NOTE — Telephone Encounter (Signed)
Patient needing a refill on percocet. CB # [phone]

## 2017-01-28 NOTE — Telephone Encounter (Signed)
Patient needing a refill on percocet

## 2017-01-29 NOTE — Telephone Encounter (Signed)
I called and advised patient no more percocet, she wants to know what she can take for the pain?  She is taking tylenol but it is not helping her at all.

## 2017-01-29 NOTE — Telephone Encounter (Signed)
No further priscriptions for percocet, she is over 3 months following surgery. [name] [name]

## 2017-01-31 NOTE — Telephone Encounter (Signed)
Ditto. [name] [name]

## 2017-01-31 NOTE — Telephone Encounter (Signed)
I called and advised tylenol is what she needs to be taking

## 2017-02-02 ENCOUNTER — Emergency Department (HOSPITAL_COMMUNITY): Payer: Non-veteran care

## 2017-02-02 ENCOUNTER — Encounter (HOSPITAL_COMMUNITY): Payer: Self-pay | Admitting: Nurse Practitioner

## 2017-02-02 ENCOUNTER — Emergency Department (HOSPITAL_COMMUNITY)
Admission: EM | Admit: 2017-02-02 | Discharge: 2017-02-02 | Disposition: A | Discharge status: Home / Self Care | Payer: Non-veteran care | Attending: Emergency Medicine | Admitting: Emergency Medicine

## 2017-02-02 DIAGNOSIS — F17210 Nicotine dependence, cigarettes, uncomplicated: Secondary | ICD-10-CM | POA: Insufficient documentation

## 2017-02-02 DIAGNOSIS — R1031 Right lower quadrant pain: Secondary | ICD-10-CM

## 2017-02-02 DIAGNOSIS — Z79899 Other long term (current) drug therapy: Secondary | ICD-10-CM | POA: Insufficient documentation

## 2017-02-02 DIAGNOSIS — Z7982 Long term (current) use of aspirin: Secondary | ICD-10-CM | POA: Insufficient documentation

## 2017-02-02 LAB — I-STAT BETA HCG BLOOD, ED (MC, WL, AP ONLY)

## 2017-02-02 LAB — COMPREHENSIVE METABOLIC PANEL
ALK PHOS: 86 U/L (ref 38–126)
ALT: 21 U/L (ref 14–54)
ANION GAP: 11 (ref 5–15)
AST: 27 U/L (ref 15–41)
Albumin: 4.4 g/dL (ref 3.5–5.0)
BUN: 12 mg/dL (ref 6–20)
CALCIUM: 9.3 mg/dL (ref 8.9–10.3)
CHLORIDE: 103 mmol/L (ref 101–111)
CO2: 23 mmol/L (ref 22–32)
Creatinine, Ser: 0.91 mg/dL (ref 0.44–1.00)
GFR calc non Af Amer: >60 mL/min (ref >60)
Glucose, Bld: 117 mg/dL — ABNORMAL HIGH (ref 65–99)
POTASSIUM: 4.3 mmol/L (ref 3.5–5.1)
SODIUM: 137 mmol/L (ref 135–145)
Total Bilirubin: 1 mg/dL (ref 0.3–1.2)
Total Protein: 7.9 g/dL (ref 6.5–8.1)

## 2017-02-02 LAB — CBC
HCT: 42.2 % (ref 36.0–46.0)
HEMOGLOBIN: 14.9 g/dL (ref 12.0–15.0)
MCH: 31.6 pg (ref 26.0–34.0)
MCHC: 35.3 g/dL (ref 30.0–36.0)
MCV: 89.6 fL (ref 78.0–100.0)
Platelets: 389 10*3/uL (ref 150–400)
RBC: 4.71 MIL/uL (ref 3.87–5.11)
RDW: 13.3 % (ref 11.5–15.5)
WBC: 9 10*3/uL (ref 4.0–10.5)

## 2017-02-02 LAB — LIPASE, BLOOD: LIPASE: 26 U/L (ref 11–51)

## 2017-02-02 MED ORDER — MORPHINE SULFATE (PF) 4 MG/ML IV SOLN
8.0000 mg | Freq: Once | INTRAVENOUS | Status: AC
  Administered 2017-02-02: 8 mg via INTRAVENOUS
  Filled 2017-02-02: qty 2

## 2017-02-02 MED ORDER — SODIUM CHLORIDE 0.9 % IV BOLUS (SEPSIS)
1000.0000 mL | Freq: Once | INTRAVENOUS | Status: AC
  Administered 2017-02-02: 1000 mL via INTRAVENOUS

## 2017-02-02 MED ORDER — ONDANSETRON HCL 4 MG/2ML IJ SOLN
4.0000 mg | Freq: Once | INTRAMUSCULAR | Status: AC
  Administered 2017-02-02: 4 mg via INTRAVENOUS
  Filled 2017-02-02: qty 2

## 2017-02-02 NOTE — ED Provider Notes (Signed)
[facility] DEPT Provider Note   CSN: [id number] Arrival date & time: 02/02/17  0630     History   Chief Complaint Chief Complaint  Patient presents with  . Abdominal Pain  . Nausea    HPI [name] [name] is a 46 y.o. female.  46 yo F with a chief complaint of right lower quadrant abdominal pain. This started suddenly woke her from sleep. Patient having pain worse than labor pains. Pain seems to come and go. Associated with vomiting. Denies fevers or chills. Denies dysuria. Denies flank pain. 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   The history is provided by the patient.  Abdominal Pain   This is a new problem. The current episode started 3 to 5 hours ago. The problem occurs constantly. The problem has been rapidly worsening. The pain is associated with an unknown factor. The pain is located in the RLQ. The quality of the pain is cramping, colicky, sharp and shooting. The pain is at a severity of 10/10. The pain is severe. Associated symptoms include nausea and vomiting. Pertinent negatives include fever, dysuria, headaches, arthralgias and myalgias. Nothing aggravates the symptoms. Nothing relieves the symptoms.    Past Medical History:  Diagnosis Date  . Chronic back pain    stenosis and HNP  . History of pulmonary embolus (PE) 03/2008  . Pneumonia    "walking" in the 90's  . Urinary urgency   . Weakness    numbness and tingling on left side    Patient Active Problem List   Diagnosis Date Noted  . S/P lumbar and lumbosacral fusion by anterior technique 10/09/2016    Class: Status post  . Disc disease, degenerative, lumbar or lumbosacral 08/26/2016    Class: Chronic  . Right lower quadrant pain 06/06/2014  . Herniated nucleus pulposus, lumbar 12/10/2013  . S/P hysterectomy 02/09/2013  . Pulmonary embolism ([place])   . Protein C deficiency ([place])   . Fibroids   . Endometriosis     Past  Surgical History:  Procedure Laterality Date  . ABDOMINAL EXPOSURE N/A 08/26/2016   Procedure: ABDOMINAL EXPOSURE;  Surgeon: Rosetta Posner, MD;  Location: [facility];  Service: Vascular;  Laterality: N/A;  . ABDOMINAL HYSTERECTOMY    . ANTERIOR LUMBAR FUSION N/A 08/26/2016   Procedure: ANTERIOR LUMBAR FUSION L5-S1, EXCISION OF HERNIATED DISC LEFT L5-S1, ZERO P LUMBAR INTERBODY IMPLANT, LEFT ILIAC CREST BONE GRAFT ;  Surgeon: Jessy Oto, MD;  Location: [name];  Service: Orthopedics;  Laterality: N/A;  . BACK SURGERY    . DIAGNOSTIC LAPAROSCOPY  2004   x3  . DILATION AND EVACUATION N/A 01/28/2013   Procedure: DILATATION AND EVACUATION;  Surgeon: Princess Bruins, MD;  Location: [facility];  Service: Gynecology;  Laterality: N/A;  . KNEE ARTHROSCOPY Left    x 3  . LUMBAR LAMINECTOMY N/A 12/10/2013   Procedure: LEFT L5-S1 MICRODISCECTOMY USING MIS;  Surgeon: Jessy Oto, MD;  Location: [facility];  Service: Orthopedics;  Laterality: N/A;  . LUMBAR LAMINECTOMY N/A 12/30/2014   Procedure: LEFT L5-S1 Redo MICRODISCECTOMY;  Surgeon: Jessy Oto, MD;  Location: [facility];  Service: Orthopedics;  Laterality: N/A;  . ROBOTIC ASSISTED TOTAL HYSTERECTOMY N/A 02/09/2013   Procedure: ROBOTIC ASSISTED TOTAL HYSTERECTOMY;  Surgeon: Princess Bruins, MD;  Location: [facility];  Service: Gynecology;  Laterality: N/A;  . UNILATERAL SALPINGECTOMY Right 02/09/2013   Procedure: UNILATERAL SALPINGECTOMY;  Surgeon: Princess Bruins, MD;  Location: [facility];  Service: Gynecology;  Laterality: Right;  . WISDOM TOOTH EXTRACTION      OB History    No data available       Home Medications    Prior to Admission medications   Medication Sig Start Date End Date Taking? Authorizing Provider  acetaminophen (TYLENOL) 500 MG tablet Take 1,000 mg by mouth every 6 (six) hours as needed for mild pain or moderate pain.   Yes Historical Provider, MD  gabapentin (NEURONTIN) 300 MG capsule Take 300 mg by mouth 3 (three) times daily as needed  (pain).   Yes Historical Provider, MD  aspirin EC 325 MG tablet Take 1 tablet (325 mg total) by mouth daily. Patient not taking: Reported on 12/27/2016 08/30/16   Jessy Oto, MD  Oxycodone HCl 10 MG TABS TAKE 1 TABLET BY MOUTH EVERY 6 HOURS FOR SEVERE PAIN, DO NOT TAKE WITH ANY OTHER NARCOTIC/OPIOID Patient not taking: Reported on 12/27/2016 10/28/16   Jessy Oto, MD  traMADol-acetaminophen (ULTRACET) 37.5-325 MG tablet Take 1 tablet by mouth every 6 (six) hours as needed. Patient not taking: Reported on 02/02/2017 12/27/16   Jessy Oto, MD    Family History History reviewed. No pertinent family history.  Social History Social History  Substance Use Topics  . Smoking status: Current Every Day Smoker    Packs/day: 0.50    Years: 25.00    Types: Cigarettes  . Smokeless tobacco: Never Used  . Alcohol use Yes     Allergies   Hydrocodone-acetaminophen and No known allergies   Review of Systems Review of Systems  Constitutional: Negative for chills and fever.  HENT: Negative for congestion and rhinorrhea.   Eyes: Negative for redness and visual disturbance.  Respiratory: Negative for shortness of breath and wheezing.   Cardiovascular: Negative for chest pain and palpitations.  Gastrointestinal: Positive for abdominal pain, nausea and vomiting.  Genitourinary: Negative for dysuria and urgency.  Musculoskeletal: Negative for arthralgias and myalgias.  Skin: Negative for pallor and wound.  Neurological: Negative for dizziness and headaches.     Physical Exam Updated Vital Signs BP 94/62 (BP Location: Left Arm)   Pulse 93   Temp 98 F (36.7 C) (Oral)   Resp 17   Ht 5\' 7"  (1.702 m)   Wt 160 lb (72.6 kg)   SpO2 99%   BMI 25.06 kg/m   Physical Exam  Constitutional: She is oriented to person, place, and time. She appears well-developed and well-nourished. No distress.  HENT:  Head: Normocephalic and atraumatic.  Eyes: EOM are normal. Pupils are equal, round, and  reactive to light.  Neck: Normal range of motion. Neck supple.  Cardiovascular: Normal rate and regular rhythm.  Exam reveals no gallop and no friction rub.   No murmur heard. Pulmonary/Chest: Effort normal. She has no wheezes. She has no rales.  Abdominal: Soft. She exhibits no distension and no mass. There is tenderness (worst to the RLQ). There is guarding. There is no rebound.  Musculoskeletal: She exhibits no edema or tenderness.  Neurological: She is alert and oriented to person, place, and time.  Skin: Skin is warm and dry. She is not diaphoretic.  Psychiatric: She has a normal mood and affect. Her behavior is normal.  Nursing note and vitals reviewed.    ED Treatments / Results  Labs (all labs ordered are listed, but only abnormal results are displayed) Labs Reviewed  COMPREHENSIVE METABOLIC PANEL - Abnormal; Notable for the following:       Result Value  Glucose, Bld 117 (*)    All other components within normal limits  LIPASE, BLOOD  CBC  I-STAT BETA HCG BLOOD, ED ([facility], [facility], [facility] ONLY)    EKG  EKG Interpretation None       Radiology Ct Renal Stone Study  Result Date: 02/02/2017 CLINICAL DATA:  Right lower quadrant pain with nausea and vomiting EXAM: CT ABDOMEN AND PELVIS WITHOUT CONTRAST TECHNIQUE: Multidetector CT imaging of the abdomen and pelvis was performed following the standard protocol without or intravenous contrast material administration. COMPARISON:  June 06, 2014 FINDINGS: Lower chest: There is a calcified granuloma in the posterior segment right lower lobe. There is slight fibrosis in the posterolateral right base. Lung bases otherwise clear. There is a small hiatal hernia. Hepatobiliary: There is a 1 x 1 cm cyst in the posterior segment of the right lobe of the liver near the dome, stable from prior study. No new liver lesions are evident on this noncontrast enhanced study. Gallbladder wall is not appreciably thickened. There is no biliary duct dilatation.  Pancreas: There is no pancreatic mass or inflammatory focus. Spleen: No splenic lesions are evident. Adrenals/Urinary Tract: Adrenals appear unremarkable bilaterally. Kidneys bilaterally show no mass or hydronephrosis on either side. There is no renal or ureteral calculus on either side. Urinary bladder is midline with wall thickness within normal limits. Stomach/Bowel: There is no appreciable bowel wall or mesenteric thickening. No bowel obstruction. No free air or portal venous air. Vascular/Lymphatic: No abdominal aortic aneurysm. No evident vascular lesion on this noncontrast enhanced study. There is no appreciable adenopathy in the abdomen or pelvis. Reproductive: Uterus absent.  No pelvic mass. Other: No appendiceal region abnormality. No abscess or ascites evident in the abdomen or pelvis. Musculoskeletal: Postoperative changes noted at L5-S1. There are no blastic or lytic bone lesions. No intramuscular or abdominal wall lesions are evident. IMPRESSION: No renal or ureteral calculus. No hydronephrosis. Appendix region appears normal. No bowel obstruction. No abscess. A cause for patient's symptoms has not been established with this study. Small hiatal hernia noted. Mild fibrotic change posterolateral right lung base with small calcified granuloma in this area. Electronically Signed   By: Lowella Grip III M.D.   On: 02/02/2017 08:26    Procedures Procedures (including critical care time)  Medications Ordered in ED Medications  sodium chloride 0.9 % bolus 1,000 mL (0 mLs Intravenous Stopped 02/02/17 0741)  morphine 4 MG/ML injection 8 mg (8 mg Intravenous Given 02/02/17 0743)  ondansetron (ZOFRAN) injection 4 mg (4 mg Intravenous Given 02/02/17 0742)  sodium chloride 0.9 % bolus 1,000 mL (0 mLs Intravenous Stopped 02/02/17 0909)     Initial Impression / Assessment and Plan / ED Course  I have reviewed the triage vital signs and the nursing notes.  Pertinent labs & imaging results that were  available during my care of the patient were reviewed by me and considered in my medical decision making (see chart for details).     46 yo F With a chief complaint of right lower quadrant pain. History and physical consistent with a kidney stone. Patient has no history of the same. Will obtain a CT scan to further evaluate.  CT scan with no specific finding.  Pain improved with pain meds and fluids.  Images reviewed by myself and patient has a large stool burden in the area of pain.  Discussed with patient will trial miralax clean out.  D/c home.   3:44 PM:  I have discussed the diagnosis/risks/treatment options with  the patient and family and believe the pt to be eligible for discharge home to follow-up with PCP. We also discussed returning to the ED immediately if new or worsening sx occur. We discussed the sx which are most concerning (e.g., sudden worsening pain, fever, inability to tolerate by mouth) that necessitate immediate return. Medications administered to the patient during their visit and any new prescriptions provided to the patient are listed below.  Medications given during this visit Medications  sodium chloride 0.9 % bolus 1,000 mL (0 mLs Intravenous Stopped 02/02/17 0741)  morphine 4 MG/ML injection 8 mg (8 mg Intravenous Given 02/02/17 0743)  ondansetron (ZOFRAN) injection 4 mg (4 mg Intravenous Given 02/02/17 0742)  sodium chloride 0.9 % bolus 1,000 mL (0 mLs Intravenous Stopped 02/02/17 0909)     The patient appears reasonably screen and/or stabilized for discharge and I doubt any other medical condition or other [facility] requiring further screening, evaluation, or treatment in the ED at this time prior to discharge.    Final Clinical Impressions(s) / ED Diagnoses   Final diagnoses:  RLQ abdominal pain    New Prescriptions Discharge Medication List as of 02/02/2017  8:59 AM       Deno Etienne, DO 02/02/17 1544

## 2017-02-02 NOTE — ED Notes (Signed)
Bed: [id number] Expected date:  Expected time:  Means of arrival:  Comments: 46 yo F/ Abd pain

## 2017-02-02 NOTE — Discharge Instructions (Signed)
TAKE 8 CAPFULS OF MIRALAX IN A 32 OUNCE GATORADE AND DRINK THE WHOLE BEVERAGE  ° °

## 2017-02-02 NOTE — ED Notes (Signed)
Bed: [facility] Expected date:  Expected time:  Means of arrival:  Comments: 

## 2017-02-02 NOTE — ED Triage Notes (Signed)
Pt arrived via EMS. Per EMS patient woke up with severe RUQ/RLQ pain at 0600 along with n/v. She has vomited aprox 4-5 times since then. Severe rebound tenderness in RLQ with guarding. Rates pain 20/10 states pain is worst than child birth. Patient is in fetal position because pain worsens when she straightens out. Unable to obtain BP due to movement and guarding, 100% RA, resp 22. Per ems pt states she still has gallbladder and appendix.

## 2017-02-05 ENCOUNTER — Encounter (INDEPENDENT_AMBULATORY_CARE_PROVIDER_SITE_OTHER): Payer: Self-pay | Admitting: Specialist

## 2017-02-05 ENCOUNTER — Ambulatory Visit (INDEPENDENT_AMBULATORY_CARE_PROVIDER_SITE_OTHER): Payer: Non-veteran care

## 2017-02-05 ENCOUNTER — Ambulatory Visit (INDEPENDENT_AMBULATORY_CARE_PROVIDER_SITE_OTHER): Payer: Non-veteran care | Admitting: Specialist

## 2017-02-05 VITALS — BP 124/86 | HR 85 | Ht 67.0 in | Wt 162.0 lb

## 2017-02-05 DIAGNOSIS — M5442 Lumbago with sciatica, left side: Secondary | ICD-10-CM | POA: Diagnosis not present

## 2017-02-05 DIAGNOSIS — Z981 Arthrodesis status: Secondary | ICD-10-CM

## 2017-02-05 DIAGNOSIS — M21372 Foot drop, left foot: Secondary | ICD-10-CM

## 2017-02-05 DIAGNOSIS — M5441 Lumbago with sciatica, right side: Secondary | ICD-10-CM | POA: Diagnosis not present

## 2017-02-05 DIAGNOSIS — M961 Postlaminectomy syndrome, not elsewhere classified: Secondary | ICD-10-CM | POA: Diagnosis not present

## 2017-02-05 MED ORDER — PREGABALIN 75 MG PO CAPS: 75.0000 mg | ORAL_CAPSULE | Freq: Two times a day (BID) | ORAL | 6 refills | Status: DC

## 2017-02-05 NOTE — Progress Notes (Signed)
Office Visit Note   Patient: [name] [name]           Date of Birth: 09-May-1971           MRN: [id number] Visit Date: 02/05/2017              Requested by: Buzzy Han, MD [address] [facility] [place] , [place] [address] PCP: Buzzy Han, MD   Assessment & Plan: Visit Diagnoses:  1. Status post lumbar spinal fusion   2. Lumbar post-laminectomy syndrome   3. Foot drop, left   This patient of 46 year old female she is 5 months following a anterior lumbar fusion at L5-S1 with excision of disc herniation arthrodesis for Modic changes and severe mechanical back pain associated with L5-S1 degenerative disc disease. 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 Consequently she has had problems with neurogenic pain following discectomies likely related to nerve changes secondary to both disc herniation and surgeries themselves. At the time of the anterior lumbar fusion, she was found to have cartilage disc herniation impressing on left thecal sac and the left S1 nerve root. Decompression was carried out prior to the anterior lumbar fusion. She has had gradual decrease pain into the back and leg still has significant numbness and weakness in left leg. She reports that she has been seen at the [facility] in [place] there she may be able to obtain a AFO for the left foot and possibly physical therapy for rehabilitation following her anterior lumbar interbody fusion at L5-S1. Radiographs today suggests that she has fused the L5-S1 level so that she may potentially consider a program to improve her endurance, standing and walking tolerance. Her husband and is a Administrator and has had 2 strokes in the last year and a half unfortunately is impaired in terms of his function at age 20. She has problems with her back and limited function and has had significant impairment with severe chronic pain and  postlaminectomy discomfort. I have explained to [name] that I think it would be reasonable to try and work on(and improving her overall function abilities. Patients that have a left foot drop and persistent pain following lumbar surgery due to have impairment of function in her lower extremity also are able usually stand and ambulate fairly well even patients that have a below-knee amputation are able to stand and ambulate. I recommend that we try and approach [facility] consider a left AFO or a left foot weakness and footdrop also to work on gait training therapy for core strengthening exercises. Recommend that we stay away from narcotic medications that she has been able to stop these medicines. Instead we can try her on medicines Lyrica previous use of Neurontin causes side effects so that Lyrica may provide her with some improved pain pattern. See her back in follow-up next 3 months.  Plan:Avoid frequent bending and stooping  No lifting greater than 10 lbs. May use ice or moist heat for pain. Weight loss is of benefit. Handicap license is approved. Try Lyrica 50 mg at night for 1 week (7Day) Then 50 mg twice a day for 1 week then go to 75 mg twice a day. AFO brace for left foot to hold foot up. PT to work on core strengthening.     Follow-Up Instructions: Return in about 3 months (around 05/05/2017).   Orders:  Orders Placed This Encounter  Procedures  . XR Lumbar Spine 2-3 Views  .  Ambulatory referral to Physical Therapy  . PT orthosis to lower extremity   Meds ordered this encounter  Medications  . pregabalin (LYRICA) 75 MG capsule    Sig: Take 1 capsule (75 mg total) by mouth 2 (two) times daily.    Dispense:  60 capsule    Refill:  6      Procedures: No procedures performed   Clinical Data: No additional findings.   Subjective: Chief Complaint  Patient presents with  . Lower Back - Follow-up    [name] is here to follow up on her back pain. States that it is  the same and her left foot still feels dead.  She ambulating with a cane today. Complains of pain left leg over the posterior left leg and left thigh into the lateral left foot. Bowel and bladder. No brace for left foot as yet. Off narcotics for the past 2 months but has periods of increased pain associated with weather and  Avoiding bending and stooping. No PT as yet. May submit brace for left leg to [facility] and also consider for PT.    Review of Systems  Constitutional: Negative.   HENT: Negative.   Eyes: Negative.   Respiratory: Negative.   Cardiovascular: Negative.   Gastrointestinal: Negative.   Endocrine: Negative.   Genitourinary: Negative.   Musculoskeletal: Negative.   Skin: Negative.   Allergic/Immunologic: Negative.   Neurological: Negative.   Hematological: Negative.   Psychiatric/Behavioral: Negative.      Objective: Vital Signs: BP 124/86 (BP Location: Left Arm, Patient Position: Sitting)   Pulse 85   Ht 5\' 7"  (1.702 m)   Wt 162 lb (73.5 kg)   BMI 25.37 kg/m   Physical Exam  Constitutional: She is oriented to person, place, and time. She appears well-developed and well-nourished.  HENT:  Head: Normocephalic and atraumatic.  Eyes: EOM are normal. Pupils are equal, round, and reactive to light.  Neck: Normal range of motion. Neck supple.  Pulmonary/Chest: Effort normal and breath sounds normal.  Abdominal: Soft. Bowel sounds are normal.  Musculoskeletal: She exhibits edema.  Neurological: She is alert and oriented to person, place, and time. She displays abnormal reflex. A sensory deficit is present.  Skin: Skin is warm and dry.  Psychiatric: She has a normal mood and affect. Her behavior is normal. Judgment and thought content normal.    Back Exam   Tenderness  The patient is experiencing tenderness in the lumbar.  Range of Motion  Extension: abnormal  Flexion: normal  Lateral Bend Right: abnormal  Lateral Bend Left: abnormal  Rotation Right: abnormal    Rotation Left: abnormal   Muscle Strength  Right Quadriceps:  5/5  Left Quadriceps:  5/5  Right Hamstrings:  5/5  Left Hamstrings:  5/5   Tests  Straight leg raise right: negative Straight leg raise left: negative  Reflexes  Patellar:  3/4 normal Achilles: 0/4 Babinski's sign: normal   Other  Toe Walk: abnormal Heel Walk: abnormal Gait: drop-foot       Specialty Comments:  No specialty comments available.  Imaging: Xr Lumbar Spine 2-3 Views  Result Date: 02/05/2017 AP and lateral flexion and extension radiographs with low profile cage with intergrated screws and graft in place with flexion and extension, Ess than one mm of measureable difference between flexion and extension. Fusion anteroirly appears solid.    PMFS History: Patient Active Problem List   Diagnosis Date Noted  . S/P lumbar and lumbosacral fusion by anterior technique 10/09/2016  Priority: High    Class: Status post  . Disc disease, degenerative, lumbar or lumbosacral 08/26/2016    Priority: High    Class: Chronic  . Herniated nucleus pulposus, lumbar 12/10/2013    Priority: High  . Right lower quadrant pain 06/06/2014  . S/P hysterectomy 02/09/2013  . Pulmonary embolism ([place])   . Protein C deficiency ([place])   . Fibroids   . Endometriosis    Past Medical History:  Diagnosis Date  . Chronic back pain    stenosis and HNP  . History of pulmonary embolus (PE) 03/2008  . Pneumonia    "walking" in the 90's  . Urinary urgency   . Weakness    numbness and tingling on left side    No family history on file.  Past Surgical History:  Procedure Laterality Date  . ABDOMINAL EXPOSURE N/A 08/26/2016   Procedure: ABDOMINAL EXPOSURE;  Surgeon: Rosetta Posner, MD;  Location: [place];  Service: Vascular;  Laterality: N/A;  . ABDOMINAL HYSTERECTOMY    . ANTERIOR LUMBAR FUSION N/A 08/26/2016   Procedure: ANTERIOR LUMBAR FUSION L5-S1, EXCISION OF HERNIATED DISC LEFT L5-S1, ZERO P LUMBAR INTERBODY IMPLANT,  LEFT ILIAC CREST BONE GRAFT ;  Surgeon: Jessy Oto, MD;  Location: [place];  Service: Orthopedics;  Laterality: N/A;  . BACK SURGERY    . DIAGNOSTIC LAPAROSCOPY  2004   x3  . DILATION AND EVACUATION N/A 01/28/2013   Procedure: DILATATION AND EVACUATION;  Surgeon: Princess Bruins, MD;  Location: [facility];  Service: Gynecology;  Laterality: N/A;  . KNEE ARTHROSCOPY Left    x 3  . LUMBAR LAMINECTOMY N/A 12/10/2013   Procedure: LEFT L5-S1 MICRODISCECTOMY USING MIS;  Surgeon: Jessy Oto, MD;  Location: [place];  Service: Orthopedics;  Laterality: N/A;  . LUMBAR LAMINECTOMY N/A 12/30/2014   Procedure: LEFT L5-S1 Redo MICRODISCECTOMY;  Surgeon: Jessy Oto, MD;  Location: [place];  Service: Orthopedics;  Laterality: N/A;  . ROBOTIC ASSISTED TOTAL HYSTERECTOMY N/A 02/09/2013   Procedure: ROBOTIC ASSISTED TOTAL HYSTERECTOMY;  Surgeon: Princess Bruins, MD;  Location: [facility];  Service: Gynecology;  Laterality: N/A;  . UNILATERAL SALPINGECTOMY Right 02/09/2013   Procedure: UNILATERAL SALPINGECTOMY;  Surgeon: Princess Bruins, MD;  Location: [facility];  Service: Gynecology;  Laterality: Right;  . WISDOM TOOTH EXTRACTION     Social History   Occupational History  . Not on file.   Social History Main Topics  . Smoking status: Current Every Day Smoker    Packs/day: 0.50    Years: 25.00    Types: Cigarettes  . Smokeless tobacco: Never Used  . Alcohol use Yes  . Drug use: No  . Sexual activity: Not Currently    Birth control/ protection: Surgical

## 2017-02-05 NOTE — Patient Instructions (Signed)
Avoid frequent bending and stooping  No lifting greater than 10 lbs. May use ice or moist heat for pain. Weight loss is of benefit. Handicap license is approved. Try Lyrical 50 mg at night for 1 week (7Day) Then 50 mg twice a day for 1 week then go to 75 mg twice a day. AFO brace for left foot to hold foot up. PT to work on core strengthening.

## 2017-02-25 ENCOUNTER — Telehealth (INDEPENDENT_AMBULATORY_CARE_PROVIDER_SITE_OTHER): Payer: Self-pay | Admitting: Radiology

## 2017-02-25 NOTE — Telephone Encounter (Signed)
There was an old VM on 4303 that said that the PT needed auth if it was to run through [place],  Can you followup on this one?

## 2017-02-28 NOTE — Telephone Encounter (Signed)
Do you know how we go about getting approval for physical therapy from the [place]?

## 2017-03-03 NOTE — Telephone Encounter (Signed)
Hi, Christy...   Nope. I didn't see anything in referral tab.   Zigmund Daniel

## 2017-03-03 NOTE — Telephone Encounter (Signed)
There is a referral in the referral tab for physical therapy (dated 02/05/2017), but Dr. Louanne Skye sent it to [facility], [facility] called and says that this needs to be approved thru the [place]. Do you know we go about doing this?

## 2017-03-06 NOTE — Telephone Encounter (Signed)
Non-VA number you will need to call [phone] to get authorization. In documents there was a copy of authorization that expired 11/18/16.   [name] [name]

## 2017-03-25 ENCOUNTER — Telehealth (INDEPENDENT_AMBULATORY_CARE_PROVIDER_SITE_OTHER): Payer: Self-pay | Admitting: Specialist

## 2017-03-25 NOTE — Telephone Encounter (Signed)
ERROR

## 2017-03-25 NOTE — Telephone Encounter (Signed)
Can you please help me with this one please.

## 2017-03-25 NOTE — Telephone Encounter (Signed)
Patient called asked if she can get something stronger for pain. Patient said she took the last lyrica last night. Patient  said her mother was going to take her to the ER last night but she decided to wait for the office to open this morning. Patient said she has to little children to take take of and her husband had a stroke and can't help her. Patient said she is using a bed pan because she is hurting that bad. The number to contact patient is [phone]

## 2017-03-25 NOTE — Telephone Encounter (Signed)
Patient called asked if she can get something stronger for pain. Patient said she took the last lyrica last night. Patient  said her mother was going to take her to the ER last night but she decided to wait for the office to open this morning. Patient said she has to little children to take take of and her husband had a stroke and can't help her. Patient said she is using a bed pan because she is hurting that bad. The number to contact patient is [phone]

## 2017-03-26 NOTE — Telephone Encounter (Signed)
I have faxed in a request for additional services to the [place].  I have asked for continuation of previous auth through 12/15/17, and I have asked for auth for PT visits. Will followup.

## 2017-03-26 NOTE — Telephone Encounter (Signed)
I called patient and [name] [name] states that she is going to go to the ED

## 2017-03-26 NOTE — Telephone Encounter (Signed)
I do not intend to prescribe any strong narcotics at this time.

## 2017-04-04 ENCOUNTER — Telehealth (INDEPENDENT_AMBULATORY_CARE_PROVIDER_SITE_OTHER): Payer: Self-pay | Admitting: *Deleted

## 2017-04-04 NOTE — Telephone Encounter (Signed)
Pt calling asking if she is ok to get another Cortizone inj?

## 2017-04-07 ENCOUNTER — Other Ambulatory Visit (INDEPENDENT_AMBULATORY_CARE_PROVIDER_SITE_OTHER): Payer: Self-pay | Admitting: Specialist

## 2017-04-07 DIAGNOSIS — M961 Postlaminectomy syndrome, not elsewhere classified: Secondary | ICD-10-CM

## 2017-04-07 MED ORDER — PREGABALIN 75 MG PO CAPS: 75.0000 mg | ORAL_CAPSULE | Freq: Two times a day (BID) | ORAL | 6 refills | Status: DC

## 2017-04-07 NOTE — Telephone Encounter (Signed)
Rx for lyrica, she may to transport the Rx to the pharmacy. jen

## 2017-04-07 NOTE — Telephone Encounter (Signed)
Patient is requesting a back injection, she states that she is out of all her meds even the lyrica, she is just wanting something that will give her some relief she is having pain down the leg into her foot. Please advise.

## 2017-04-07 NOTE — Telephone Encounter (Signed)
Sending to you to document, please call number on her paper that I gave you, thank you

## 2017-04-08 NOTE — Progress Notes (Signed)
Called rx to [facility] [address]--Pt is aware

## 2017-04-08 NOTE — Telephone Encounter (Signed)
I called the [phone] and spoke w/ "Z" and he states that this is the wrong number so he gave me the correct number to the [place] in [place]--[phone], Ext [phone].  I spoke to the operator there and she tranfered me to Harmon Pier the person that would be handling this case, her ext is [phone].  She states that they have not rec'd anything recently on this patient and so I asked what they needed to be able to extend her auth and to get auth for her PT.  She said they needed the last OV note and how many visits she would need and that it would need to be faxed to her nurse Lieutenant Diego @ [phone].  I faxed the last 3 OV notes and a request for 15 visits/ or till 12/15/2017.  And for additional services for PT.  She said that the PT may not get approved at this point since it has been so far out from surgery. As far as auth for [place] to see her that they may not get it back to [place] today or this week but they would try to get it as soon as they could.

## 2017-04-08 NOTE — Telephone Encounter (Signed)
Can she get an injection in her back?

## 2017-04-10 NOTE — Telephone Encounter (Signed)
After fusion the area is not really moving or stressing the nerve structures, I don't think it (a steriod injection in the back) will do anything. jen

## 2017-04-11 NOTE — Telephone Encounter (Signed)
I called and lmom advised on message below.

## 2017-04-14 ENCOUNTER — Ambulatory Visit (INDEPENDENT_AMBULATORY_CARE_PROVIDER_SITE_OTHER): Payer: Self-pay | Admitting: Specialist

## 2017-04-14 ENCOUNTER — Encounter (INDEPENDENT_AMBULATORY_CARE_PROVIDER_SITE_OTHER): Payer: Self-pay | Admitting: Specialist

## 2017-04-14 VITALS — BP 106/81 | HR 92 | Ht 67.0 in | Wt 162.0 lb

## 2017-04-14 DIAGNOSIS — M4326 Fusion of spine, lumbar region: Secondary | ICD-10-CM

## 2017-04-14 DIAGNOSIS — M961 Postlaminectomy syndrome, not elsewhere classified: Secondary | ICD-10-CM

## 2017-04-14 MED ORDER — MORPHINE SULFATE 15 MG PO TABS: 15.0000 mg | ORAL_TABLET | ORAL | 0 refills | Status: DC | PRN

## 2017-04-14 MED ORDER — PREGABALIN 75 MG PO CAPS: 75.0000 mg | ORAL_CAPSULE | Freq: Two times a day (BID) | ORAL | 6 refills | Status: DC

## 2017-04-14 NOTE — Progress Notes (Signed)
Office Visit Note   Patient: [name] [name]           Date of Birth: 1971/06/24           MRN: [id number] Visit Date: 04/14/2017              Requested by: Buzzy Han, MD [address] [facility] [place] , [place] [address] PCP: Buzzy Han, MD   Assessment & Plan: Visit Diagnoses:  1. Fusion of spine of lumbar region   2. Lumbar post-laminectomy syndrome     Plan: Avoid frequent bending and stooping  No lifting greater than 10 lbs. May use ice or moist heat for pain. Weight loss is of benefit. Handicap license is approved.  Follow-Up Instructions: Return in about 4 weeks (around 05/12/2017).   Orders:  Orders Placed This Encounter  Procedures  . Ambulatory referral to Physical Medicine Rehab  . Ambulatory referral to Physical Medicine Rehab   Meds ordered this encounter  Medications  . morphine (MSIR) 15 MG tablet    Sig: Take 1 tablet (15 mg total) by mouth every 4 (four) hours as needed for severe pain.    Dispense:  40 tablet    Refill:  0  . pregabalin (LYRICA) 75 MG capsule    Sig: Take 1 capsule (75 mg total) by mouth 2 (two) times daily.    Dispense:  60 capsule    Refill:  6      Procedures: No procedures performed   Clinical Data: No additional findings.   Subjective: Chief Complaint  Patient presents with  . Lower Back - Follow-up    46 year old female with history of lumbar disc herniation status post left L5-S1 microdiscectomies for HNP x 2 then ALIF at the L5-S1 level 08/2016 with low profile Implant and bone graft. Discontinued use of oxycodone in 11/2016. Since then she reports continuous pain in the back and into the left leg with numbness in the left plantar aspect of the foot. Bowel and bladder function is such that she can not hold her bladder. She report urgency and accidents when going to go to the bathroom. No pain with cough, but sneeze is with pain. Has tried the TENS unit and continues to use it  daily. Having trouble with charlie horses in the left calf 4/5 nights. She reports that her toes pull down at night.     Review of Systems  Constitutional: Negative.   HENT: Negative.   Eyes: Negative.   Respiratory: Negative.   Cardiovascular: Negative.   Gastrointestinal: Negative.   Endocrine: Negative.   Genitourinary: Negative.   Musculoskeletal: Negative.   Skin: Negative.   Allergic/Immunologic: Negative.   Neurological: Negative.   Hematological: Negative.   Psychiatric/Behavioral: Negative.      Objective: Vital Signs: BP 106/81 (BP Location: Left Arm, Patient Position: Sitting)   Pulse 92   Ht 5\' 7"  (1.702 m)   Wt 162 lb (73.5 kg)   BMI 25.37 kg/m   Physical Exam  Constitutional: She is oriented to person, place, and time. She appears well-developed and well-nourished.  HENT:  Head: Normocephalic and atraumatic.  Eyes: EOM are normal. Pupils are equal, round, and reactive to light.  Neck: Normal range of motion. Neck supple.  Pulmonary/Chest: Effort normal and breath sounds normal.  Abdominal: Soft. Bowel sounds are normal.  Neurological: She is alert and oriented to person, place, and time.  Skin: Skin is warm and dry.  Psychiatric: She has a normal mood and affect. Her  behavior is normal. Judgment and thought content normal.    Back Exam   Tenderness  The patient is experiencing tenderness in the lumbar.  Range of Motion  Extension: normal  Flexion: abnormal  Lateral Bend Right: abnormal  Lateral Bend Left: abnormal  Rotation Right: abnormal  Rotation Left: abnormal   Tests  Straight leg raise right: negative Straight leg raise left: negative  Reflexes  Patellar:  2/4 normal Achilles:  0/4 abnormal Babinski's sign: normal   Other  Toe Walk: abnormal Heel Walk: abnormal Scars: absent  Comments:  Right calf is 15.5 inches in circumference and the left is 15.75 inches. History of previous PEs.       Specialty Comments:  No  specialty comments available.  Imaging: No results found.   PMFS History: Patient Active Problem List   Diagnosis Date Noted  . S/P lumbar and lumbosacral fusion by anterior technique 10/09/2016    Priority: High    Class: Status post  . Disc disease, degenerative, lumbar or lumbosacral 08/26/2016    Priority: High    Class: Chronic  . Herniated nucleus pulposus, lumbar 12/10/2013    Priority: High  . Right lower quadrant pain 06/06/2014  . S/P hysterectomy 02/09/2013  . Pulmonary embolism ([place])   . Protein C deficiency ([place])   . Fibroids   . Endometriosis    Past Medical History:  Diagnosis Date  . Chronic back pain    stenosis and HNP  . History of pulmonary embolus (PE) 03/2008  . Pneumonia    "walking" in the 90's  . Urinary urgency   . Weakness    numbness and tingling on left side    No family history on file.  Past Surgical History:  Procedure Laterality Date  . ABDOMINAL EXPOSURE N/A 08/26/2016   Procedure: ABDOMINAL EXPOSURE;  Surgeon: Rosetta Posner, MD;  Location: [place];  Service: Vascular;  Laterality: N/A;  . ABDOMINAL HYSTERECTOMY    . ANTERIOR LUMBAR FUSION N/A 08/26/2016   Procedure: ANTERIOR LUMBAR FUSION L5-S1, EXCISION OF HERNIATED DISC LEFT L5-S1, ZERO P LUMBAR INTERBODY IMPLANT, LEFT ILIAC CREST BONE GRAFT ;  Surgeon: Jessy Oto, MD;  Location: [place];  Service: Orthopedics;  Laterality: N/A;  . BACK SURGERY    . DIAGNOSTIC LAPAROSCOPY  2004   x3  . DILATION AND EVACUATION N/A 01/28/2013   Procedure: DILATATION AND EVACUATION;  Surgeon: Princess Bruins, MD;  Location: [facility];  Service: Gynecology;  Laterality: N/A;  . KNEE ARTHROSCOPY Left    x 3  . LUMBAR LAMINECTOMY N/A 12/10/2013   Procedure: LEFT L5-S1 MICRODISCECTOMY USING MIS;  Surgeon: Jessy Oto, MD;  Location: [place];  Service: Orthopedics;  Laterality: N/A;  . LUMBAR LAMINECTOMY N/A 12/30/2014   Procedure: LEFT L5-S1 Redo MICRODISCECTOMY;  Surgeon: Jessy Oto, MD;  Location: [place];  Service: Orthopedics;  Laterality: N/A;  . ROBOTIC ASSISTED TOTAL HYSTERECTOMY N/A 02/09/2013   Procedure: ROBOTIC ASSISTED TOTAL HYSTERECTOMY;  Surgeon: Princess Bruins, MD;  Location: [facility];  Service: Gynecology;  Laterality: N/A;  . UNILATERAL SALPINGECTOMY Right 02/09/2013   Procedure: UNILATERAL SALPINGECTOMY;  Surgeon: Princess Bruins, MD;  Location: [facility];  Service: Gynecology;  Laterality: Right;  . WISDOM TOOTH EXTRACTION     Social History   Occupational History  . Not on file.   Social History Main Topics  . Smoking status: Current Every Day Smoker    Packs/day: 0.50    Years: 25.00    Types: Cigarettes  .  Smokeless tobacco: Never Used  . Alcohol use Yes  . Drug use: No  . Sexual activity: Not Currently    Birth control/ protection: Surgical

## 2017-04-14 NOTE — Patient Instructions (Signed)
Avoid frequent bending and stooping  No lifting greater than 10 lbs. May use ice or moist heat for pain. Weight loss is of benefit. Handicap license is approved.   

## 2017-04-14 NOTE — Telephone Encounter (Signed)
Patient has been sched for PT in June 2018

## 2017-04-24 ENCOUNTER — Ambulatory Visit (INDEPENDENT_AMBULATORY_CARE_PROVIDER_SITE_OTHER): Payer: Non-veteran care

## 2017-04-24 ENCOUNTER — Encounter (INDEPENDENT_AMBULATORY_CARE_PROVIDER_SITE_OTHER): Payer: Self-pay | Admitting: Physical Medicine and Rehabilitation

## 2017-04-24 ENCOUNTER — Ambulatory Visit (INDEPENDENT_AMBULATORY_CARE_PROVIDER_SITE_OTHER): Payer: Non-veteran care | Admitting: Physical Medicine and Rehabilitation

## 2017-04-24 VITALS — BP 122/82 | HR 72

## 2017-04-24 DIAGNOSIS — M961 Postlaminectomy syndrome, not elsewhere classified: Secondary | ICD-10-CM | POA: Diagnosis not present

## 2017-04-24 DIAGNOSIS — M5416 Radiculopathy, lumbar region: Secondary | ICD-10-CM | POA: Diagnosis not present

## 2017-04-24 MED ORDER — LIDOCAINE HCL (PF) 1 % IJ SOLN
2.0000 mL | Freq: Once | INTRAMUSCULAR | Status: AC
  Administered 2017-04-24: 2 mL

## 2017-04-24 MED ORDER — METHYLPREDNISOLONE ACETATE 80 MG/ML IJ SUSP
80.0000 mg | Freq: Once | INTRAMUSCULAR | Status: AC
  Administered 2017-04-24: 80 mg

## 2017-04-24 NOTE — Procedures (Signed)
S1 Lumbosacral Transforaminal Epidural Steroid Injection - Sub-Pedicular Approach with Fluoroscopic Guidance   Patient: [name] [name]      Date of Birth: [birth year] MRN: [id number] PCP: Buzzy Han, MD      Visit Date: 04/24/2017   Universal Protocol:    Date/Time: 05/10/182:41 PM  Consent Given By: the patient  Position:  PRONE  Additional Comments: Vital signs were monitored before and after the procedure. Patient was prepped and draped in the usual sterile fashion. The correct patient, procedure, and site was verified.   Injection Procedure Details:  Procedure Site One Meds Administered:  Meds ordered this encounter  Medications  . lidocaine (PF) (XYLOCAINE) 1 % injection 2 mL  . methylPREDNISolone acetate (DEPO-MEDROL) injection 80 mg    Laterality: Left  Location/Site:  S1 Foramen   Needle size: 22 G  Needle type: Spinal  Needle Placement: Transforaminal  Findings:  -Contrast Used: 0.5 mL iohexol 180 mg iodine/mL   -Comments: Excellent flow of contrast along the nerve and into the epidural space.  Procedure Details: After squaring off the sacral end-plate to get a true AP view, the C-arm was positioned so that the best possible view of the S1 foramen was visualized. The soft tissues overlying this structure were infiltrated with 2-3 ml. of 1% Lidocaine without Epinephrine.    The spinal needle was inserted toward the target using a "trajectory" view along the fluoroscope beam.  Under AP and lateral visualization, the needle was advanced so it did not puncture dura. Biplanar projections were used to confirm position. Aspiration was confirmed to be negative for CSF and/or blood. A 1-2 ml. volume of Isovue-250 was injected and flow of contrast was noted at each level. Radiographs were obtained for documentation purposes.   After attaining the desired flow of contrast documented above, a 0.5 to 1.0 ml test dose of 0.25% Marcaine was injected into  each respective transforaminal space.  The patient was observed for 90 seconds post injection.  After no sensory deficits were reported, and normal lower extremity motor function was noted,   the above injectate was administered so that equal amounts of the injectate were placed at each foramen (level) into the transforaminal epidural space.   Additional Comments:  The patient tolerated the procedure well Dressing: Band-Aid    Post-procedure details: Patient was observed during the procedure. Post-procedure instructions were reviewed.  Patient left the clinic in stable condition.

## 2017-04-24 NOTE — Patient Instructions (Signed)

## 2017-04-24 NOTE — Progress Notes (Deleted)
Pain center of low back and radiating down left leg to foot. Cramps and spasms in calf and toes. Numbness into toes. Leg is swollen,

## 2017-04-24 NOTE — Progress Notes (Signed)
[name] [name] - 46 y.o. female MRN [id number]  Date of birth: 1971/05/06  Office Visit Note: Visit Date: 04/24/2017 PCP: Buzzy Han, MD Referred by: Buzzy Han*  Subjective: Chief Complaint  Patient presents with  . Lower Back - Pain   HPI: [name] [name] is very pleasant 46 year old female who I seen on a few occasions for injections for herniated disks. She has a history of lumbar disc herniation status post left L5-S1 microdiscectomies for HNP x 2 then ALIF at the L5-S1 level 08/2016 with low profile Implant and bone graft. She is having continued symptoms of left S1 radiculitis radiculopathy.  I am going to complete a left S1 transforaminal epidural steroid injection.    ROS Otherwise per HPI.  Assessment & Plan: Visit Diagnoses:  1. Lumbar radiculopathy   2. Post laminectomy syndrome     Plan: Findings:  Left S1 transforaminal epidural steroid injection.    Meds & Orders:  Meds ordered this encounter  Medications  . lidocaine (PF) (XYLOCAINE) 1 % injection 2 mL  . methylPREDNISolone acetate (DEPO-MEDROL) injection 80 mg    Orders Placed This Encounter  Procedures  . XR C-ARM NO REPORT  . Epidural Steroid injection    Follow-up: Return for Dr. Louanne Skye.   Procedures: No procedures performed  S1 Lumbosacral Transforaminal Epidural Steroid Injection - Sub-Pedicular Approach with Fluoroscopic Guidance   Patient: [name] [name]      Date of Birth: [birth year] MRN: [id number] PCP: Buzzy Han, MD      Visit Date: 04/24/2017   Universal Protocol:    Date/Time: 05/10/182:41 PM  Consent Given By: the patient  Position:  PRONE  Additional Comments: Vital signs were monitored before and after the procedure. Patient was prepped and draped in the usual sterile fashion. The correct patient, procedure, and site was verified.   Injection Procedure Details:  Procedure Site One Meds Administered:  Meds ordered this  encounter  Medications  . lidocaine (PF) (XYLOCAINE) 1 % injection 2 mL  . methylPREDNISolone acetate (DEPO-MEDROL) injection 80 mg    Laterality: Left  Location/Site:  S1 Foramen   Needle size: 22 G  Needle type: Spinal  Needle Placement: Transforaminal  Findings:  -Contrast Used: 0.5 mL iohexol 180 mg iodine/mL   -Comments: Excellent flow of contrast along the nerve and into the epidural space.  Procedure Details: After squaring off the sacral end-plate to get a true AP view, the C-arm was positioned so that the best possible view of the S1 foramen was visualized. The soft tissues overlying this structure were infiltrated with 2-3 ml. of 1% Lidocaine without Epinephrine.    The spinal needle was inserted toward the target using a "trajectory" view along the fluoroscope beam.  Under AP and lateral visualization, the needle was advanced so it did not puncture dura. Biplanar projections were used to confirm position. Aspiration was confirmed to be negative for CSF and/or blood. A 1-2 ml. volume of Isovue-250 was injected and flow of contrast was noted at each level. Radiographs were obtained for documentation purposes.   After attaining the desired flow of contrast documented above, a 0.5 to 1.0 ml test dose of 0.25% Marcaine was injected into each respective transforaminal space.  The patient was observed for 90 seconds post injection.  After no sensory deficits were reported, and normal lower extremity motor function was noted,   the above injectate was administered so that equal amounts of the injectate were placed at each foramen (level) into the transforaminal epidural space.  Additional Comments:  The patient tolerated the procedure well Dressing: Band-Aid    Post-procedure details: Patient was observed during the procedure. Post-procedure instructions were reviewed.  Patient left the clinic in stable condition.     Clinical History: No specialty comments available.    She reports that she has been smoking Cigarettes.  She has a 12.50 pack-year smoking history. She has never used smokeless tobacco. No results for input(s): HGBA1C, LABURIC in the last 8760 hours.  Objective:  VS:  HT:    WT:   BMI:     BP:122/82  HR:72bpm  TEMP: ( )  RESP:98 % Physical Exam  Musculoskeletal:  Patient ambulates with antalgic gait to the left. She has good distal strength.    Ortho Exam Imaging: Xr C-arm No Report  Result Date: 04/24/2017 Please see Notes or Procedures tab for imaging impression.   Past Medical/Family/Surgical/Social History: Medications & Allergies reviewed per EMR Patient Active Problem List   Diagnosis Date Noted  . S/P lumbar and lumbosacral fusion by anterior technique 10/09/2016    Class: Status post  . Disc disease, degenerative, lumbar or lumbosacral 08/26/2016    Class: Chronic  . Right lower quadrant pain 06/06/2014  . Herniated nucleus pulposus, lumbar 12/10/2013  . S/P hysterectomy 02/09/2013  . Pulmonary embolism ([place])   . Protein C deficiency ([place])   . Fibroids   . Endometriosis    Past Medical History:  Diagnosis Date  . Chronic back pain    stenosis and HNP  . History of pulmonary embolus (PE) 03/2008  . Pneumonia    "walking" in the 90's  . Urinary urgency   . Weakness    numbness and tingling on left side   No family history on file. Past Surgical History:  Procedure Laterality Date  . ABDOMINAL EXPOSURE N/A 08/26/2016   Procedure: ABDOMINAL EXPOSURE;  Surgeon: Rosetta Posner, MD;  Location: [facility];  Service: Vascular;  Laterality: N/A;  . ABDOMINAL HYSTERECTOMY    . ANTERIOR LUMBAR FUSION N/A 08/26/2016   Procedure: ANTERIOR LUMBAR FUSION L5-S1, EXCISION OF HERNIATED DISC LEFT L5-S1, ZERO P LUMBAR INTERBODY IMPLANT, LEFT ILIAC CREST BONE GRAFT ;  Surgeon: Jessy Oto, MD;  Location: [facility];  Service: Orthopedics;  Laterality: N/A;  . BACK SURGERY    . DIAGNOSTIC LAPAROSCOPY  2004   x3  . DILATION AND  EVACUATION N/A 01/28/2013   Procedure: DILATATION AND EVACUATION;  Surgeon: Princess Bruins, MD;  Location: [facility];  Service: Gynecology;  Laterality: N/A;  . KNEE ARTHROSCOPY Left    x 3  . LUMBAR LAMINECTOMY N/A 12/10/2013   Procedure: LEFT L5-S1 MICRODISCECTOMY USING MIS;  Surgeon: Jessy Oto, MD;  Location: [facility];  Service: Orthopedics;  Laterality: N/A;  . LUMBAR LAMINECTOMY N/A 12/30/2014   Procedure: LEFT L5-S1 Redo MICRODISCECTOMY;  Surgeon: Jessy Oto, MD;  Location: [facility];  Service: Orthopedics;  Laterality: N/A;  . ROBOTIC ASSISTED TOTAL HYSTERECTOMY N/A 02/09/2013   Procedure: ROBOTIC ASSISTED TOTAL HYSTERECTOMY;  Surgeon: Princess Bruins, MD;  Location: [facility];  Service: Gynecology;  Laterality: N/A;  . UNILATERAL SALPINGECTOMY Right 02/09/2013   Procedure: UNILATERAL SALPINGECTOMY;  Surgeon: Princess Bruins, MD;  Location: [facility];  Service: Gynecology;  Laterality: Right;  . WISDOM TOOTH EXTRACTION     Social History   Occupational History  . Not on file.   Social History Main Topics  . Smoking status: Current Every Day Smoker    Packs/day: 0.50    Years: 25.00  Types: Cigarettes  . Smokeless tobacco: Never Used  . Alcohol use Yes  . Drug use: No  . Sexual activity: Not Currently    Birth control/ protection: Surgical

## 2017-05-01 ENCOUNTER — Other Ambulatory Visit (INDEPENDENT_AMBULATORY_CARE_PROVIDER_SITE_OTHER): Payer: Self-pay | Admitting: Specialist

## 2017-05-01 NOTE — Telephone Encounter (Signed)
Patient called needing Rx refilled (morphine 15) The number to contact patient is [phone]

## 2017-05-02 MED ORDER — MORPHINE SULFATE 15 MG PO TABS: 15.0000 mg | ORAL_TABLET | ORAL | 0 refills | Status: DC | PRN

## 2017-05-02 NOTE — Telephone Encounter (Signed)
I called and advised pt her rx was ready for pick up at the front desk.

## 2017-05-07 ENCOUNTER — Ambulatory Visit (INDEPENDENT_AMBULATORY_CARE_PROVIDER_SITE_OTHER): Payer: Non-veteran care | Admitting: Specialist

## 2017-05-07 ENCOUNTER — Encounter (INDEPENDENT_AMBULATORY_CARE_PROVIDER_SITE_OTHER): Payer: Self-pay | Admitting: Specialist

## 2017-05-07 ENCOUNTER — Ambulatory Visit (INDEPENDENT_AMBULATORY_CARE_PROVIDER_SITE_OTHER): Payer: Non-veteran care

## 2017-05-07 VITALS — BP 112/79 | HR 71 | Ht 67.0 in | Wt 170.0 lb

## 2017-05-07 DIAGNOSIS — M4326 Fusion of spine, lumbar region: Secondary | ICD-10-CM

## 2017-05-07 DIAGNOSIS — M961 Postlaminectomy syndrome, not elsewhere classified: Secondary | ICD-10-CM | POA: Diagnosis not present

## 2017-05-07 NOTE — Progress Notes (Addendum)
Office Visit Note   Patient: [name] [name]           Date of Birth: Jul 19, 1971           MRN: [id number] Visit Date: 05/07/2017              Requested by: Buzzy Han, MD [id number] , [place] [address] PCP: Buzzy Han, MD   Assessment & Plan: Visit Diagnoses:  1. Lumbar post-laminectomy syndrome   2. 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 She is now 8 months following her ALIF with a low profile plate and cage at C5-E5 with severe left leg, pain. TENs helps, previous Trial SCS was unsuccessful but complicated by dural leak and resulting early removal of the trial SCS. Recent ESI via left S1 caudle approach Was able to improve her pain pattern and decrease her narcotic usage. Would recommend that we complete a series of ESIs as they helped, obtain CT scan of the lumbar spine to assess for healing of the ALIF. Consider a more localized neurostimulator for the S1 root only vs attempting a  2nd trial of SCS. Continue morphine sulfate IR 15 mg q 6-8 hours.   Plan: Avoid bending, stooping and avoid lifting weights greater than 10 lbs. Avoid prolong standing and walking. Avoid frequent bending and stooping  No lifting greater than 10 lbs. May use ice or moist heat for pain. Weight loss is of benefit. Handicap license is approved. Dr. Romona Curls secretary/Assistant will call to arrange for epidural steroid injection  CT san of the lumbar spine to assess healing of the L5-S1 ALIF  Follow-Up Instructions: No Follow-up on file.   Orders:  Orders Placed This Encounter  Procedures  . XR Lumbar Spine 2-3 Views   No orders of the defined types were placed in this encounter.     Procedures: No procedures performed   Clinical Data: No additional findings.   Subjective: Chief Complaint  Patient  presents with  . Lower Back - Follow-up    46 year old female with left side sciatica and post laminectomy syndrome, she underwent a left S1 caudle block 04/24/2017 by Dr. Ernestina Patches. Noted pain very similar to her usual with the placement of the needle for the ESI. Has had some pain relief in the left leg. Medications have lasted about 2 weeks. Less medication about every 6 hours, previously every 4 hours. She uses a TENs and this does help, has had an attempt at a SCS at the lower thoracic level unfortunately she had a dural leak with severe head aches, not able to discern pain improvement. Had the stimular removed after hours by Dr. Ernestina Patches. Bowel and bladder with some symptoms of urgency and some accidents. Has to use depends. Post surgery apparently worse.     Review of Systems  Constitutional: Negative.   HENT: Negative.   Eyes: Negative.   Respiratory: Negative.   Cardiovascular: Negative.   Gastrointestinal: Negative.   Endocrine: Negative.   Genitourinary: Negative.   Musculoskeletal: Negative.   Skin: Negative.   Allergic/Immunologic: Negative.   Neurological: Negative.   Hematological: Negative.   Psychiatric/Behavioral: Negative.      Objective: Vital Signs: BP 112/79 (BP Location: Left Arm, Patient Position: Sitting)   Pulse 71   Ht 5\' 7"  (1.702 m)   Wt 170 lb (77.1 kg)  BMI 26.63 kg/m   Physical Exam  Constitutional: She is oriented to person, place, and time. She appears well-developed and well-nourished.  HENT:  Head: Normocephalic and atraumatic.  Eyes: EOM are normal. Pupils are equal, round, and reactive to light.  Neck: Normal range of motion. Neck supple.  Pulmonary/Chest: Effort normal and breath sounds normal.  Abdominal: Soft. Bowel sounds are normal.  Musculoskeletal: Normal range of motion.  Neurological: She is alert and oriented to person, place, and time.  Skin: Skin is warm and dry.  Psychiatric: She has a normal mood and affect. Her behavior is  normal. Judgment and thought content normal.    Back Exam   Reflexes  Patellar: 2/4 Achilles: 0/4 Babinski's sign: normal   Comments:  Weak in left foot DF 3/5, plantar flexion is also weak 4/5  Absent left ankle jerk.      Specialty Comments:  No specialty comments available.  Imaging: No results found.   PMFS History: Patient Active Problem List   Diagnosis Date Noted  . S/P lumbar and lumbosacral fusion by anterior technique 10/09/2016    Priority: High    Class: Status post  . Disc disease, degenerative, lumbar or lumbosacral 08/26/2016    Priority: High    Class: Chronic  . Herniated nucleus pulposus, lumbar 12/10/2013    Priority: High  . Right lower quadrant pain 06/06/2014  . S/P hysterectomy 02/09/2013  . Pulmonary embolism ([place])   . Protein C deficiency ([place])   . Fibroids   . Endometriosis    Past Medical History:  Diagnosis Date  . Chronic back pain    stenosis and HNP  . History of pulmonary embolus (PE) 03/2008  . Pneumonia    "walking" in the 90's  . Urinary urgency   . Weakness    numbness and tingling on left side    No family history on file.  Past Surgical History:  Procedure Laterality Date  . ABDOMINAL EXPOSURE N/A 08/26/2016   Procedure: ABDOMINAL EXPOSURE;  Surgeon: Rosetta Posner, MD;  Location: [facility];  Service: Vascular;  Laterality: N/A;  . ABDOMINAL HYSTERECTOMY    . ANTERIOR LUMBAR FUSION N/A 08/26/2016   Procedure: ANTERIOR LUMBAR FUSION L5-S1, EXCISION OF HERNIATED DISC LEFT L5-S1, ZERO P LUMBAR INTERBODY IMPLANT, LEFT ILIAC CREST BONE GRAFT ;  Surgeon: Jessy Oto, MD;  Location: [facility];  Service: Orthopedics;  Laterality: N/A;  . BACK SURGERY    . DIAGNOSTIC LAPAROSCOPY  2004   x3  . DILATION AND EVACUATION N/A 01/28/2013   Procedure: DILATATION AND EVACUATION;  Surgeon: Princess Bruins, MD;  Location: [facility];  Service: Gynecology;  Laterality: N/A;  . KNEE ARTHROSCOPY Left    x 3  . LUMBAR LAMINECTOMY N/A 12/10/2013    Procedure: LEFT L5-S1 MICRODISCECTOMY USING MIS;  Surgeon: Jessy Oto, MD;  Location: [facility];  Service: Orthopedics;  Laterality: N/A;  . LUMBAR LAMINECTOMY N/A 12/30/2014   Procedure: LEFT L5-S1 Redo MICRODISCECTOMY;  Surgeon: Jessy Oto, MD;  Location: [facility];  Service: Orthopedics;  Laterality: N/A;  . ROBOTIC ASSISTED TOTAL HYSTERECTOMY N/A 02/09/2013   Procedure: ROBOTIC ASSISTED TOTAL HYSTERECTOMY;  Surgeon: Princess Bruins, MD;  Location: [facility];  Service: Gynecology;  Laterality: N/A;  . UNILATERAL SALPINGECTOMY Right 02/09/2013   Procedure: UNILATERAL SALPINGECTOMY;  Surgeon: Princess Bruins, MD;  Location: [facility];  Service: Gynecology;  Laterality: Right;  . WISDOM TOOTH EXTRACTION     Social History   Occupational History  . Not on  file.   Social History Main Topics  . Smoking status: Current Every Day Smoker    Packs/day: 0.50    Years: 25.00    Types: Cigarettes  . Smokeless tobacco: Never Used  . Alcohol use Yes  . Drug use: No  . Sexual activity: Not Currently    Birth control/ protection: Surgical

## 2017-05-07 NOTE — Patient Instructions (Addendum)
Avoid bending, stooping and avoid lifting weights greater than 10 lbs. Avoid prolong standing and walking. Avoid frequent bending and stooping  No lifting greater than 10 lbs. May use ice or moist heat for pain. Weight loss is of benefit. Handicap license is approved. Dr. Romona Curls secretary/Assistant will call to arrange for epidural steroid injection  CT san of the lumbar spine to assess healing of the L5-S1 ALIF

## 2017-05-13 ENCOUNTER — Other Ambulatory Visit: Payer: Non-veteran care

## 2017-05-14 ENCOUNTER — Other Ambulatory Visit (INDEPENDENT_AMBULATORY_CARE_PROVIDER_SITE_OTHER): Payer: Self-pay

## 2017-05-14 NOTE — Telephone Encounter (Signed)
Patient would like a Rx refill on Morphine.  CB# is [phone]. Thank You.

## 2017-05-15 ENCOUNTER — Encounter (INDEPENDENT_AMBULATORY_CARE_PROVIDER_SITE_OTHER): Payer: Self-pay | Admitting: Physical Medicine and Rehabilitation

## 2017-05-15 ENCOUNTER — Ambulatory Visit (INDEPENDENT_AMBULATORY_CARE_PROVIDER_SITE_OTHER): Payer: Non-veteran care | Admitting: Physical Medicine and Rehabilitation

## 2017-05-15 ENCOUNTER — Other Ambulatory Visit (INDEPENDENT_AMBULATORY_CARE_PROVIDER_SITE_OTHER): Payer: Self-pay | Admitting: Specialist

## 2017-05-15 ENCOUNTER — Ambulatory Visit (INDEPENDENT_AMBULATORY_CARE_PROVIDER_SITE_OTHER): Payer: Non-veteran care

## 2017-05-15 VITALS — BP 120/85 | HR 81

## 2017-05-15 DIAGNOSIS — M961 Postlaminectomy syndrome, not elsewhere classified: Secondary | ICD-10-CM | POA: Diagnosis not present

## 2017-05-15 DIAGNOSIS — M5416 Radiculopathy, lumbar region: Secondary | ICD-10-CM | POA: Diagnosis not present

## 2017-05-15 MED ORDER — METHYLPREDNISOLONE ACETATE 80 MG/ML IJ SUSP
80.0000 mg | Freq: Once | INTRAMUSCULAR | Status: AC
  Administered 2017-05-15: 80 mg

## 2017-05-15 MED ORDER — LIDOCAINE HCL (PF) 1 % IJ SOLN
2.0000 mL | Freq: Once | INTRAMUSCULAR | Status: AC
  Administered 2017-05-15: 2 mL

## 2017-05-15 MED ORDER — MORPHINE SULFATE 15 MG PO TABS: 15.0000 mg | ORAL_TABLET | ORAL | 0 refills | Status: DC | PRN

## 2017-05-15 NOTE — Patient Instructions (Signed)

## 2017-05-15 NOTE — Addendum Note (Signed)
Addended by: Minda Ditto, [name] N on: 05/15/2017 10:06 AM   Modules accepted: Orders

## 2017-05-15 NOTE — Telephone Encounter (Signed)
Rx printed today, she can pick up tomorrow. jen

## 2017-05-15 NOTE — Progress Notes (Deleted)
Left side low back pain down leg into foot. Patient states she had minimal relief with last injection. Took 2 to 3 days before she felt any relief.

## 2017-05-16 NOTE — Procedures (Signed)
S1 Lumbosacral Transforaminal Epidural Steroid Injection - Sub-Pedicular Approach with Fluoroscopic Guidance   Patient: [name] [name]      Date of Birth: [birth year] MRN: [id number] PCP: Buzzy Han, MD      Visit Date: 05/15/2017   Mrs. Danelle Earthly is a 46 year old female with continued left hip and leg pain radicular in nature. She is status post anterior lumbar fusion. She has had prior back surgery. Last injection was an S1 transforaminal epidural injection with some relief but not great. We do want to repeat this to try to give her some relief. Spinal cord stimulator trial was not very beneficial for her but it was mainly because she had dural puncture and dural headache complication.  Universal Protocol:    Date/Time: 06/01/186:01 AM  Consent Given By: the patient  Position:  PRONE  Additional Comments: Vital signs were monitored before and after the procedure. Patient was prepped and draped in the usual sterile fashion. The correct patient, procedure, and site was verified.   Injection Procedure Details:  Procedure Site One Meds Administered:  Meds ordered this encounter  Medications  . lidocaine (PF) (XYLOCAINE) 1 % injection 2 mL  . methylPREDNISolone acetate (DEPO-MEDROL) injection 80 mg    Laterality: Left  Location/Site:  S1 Foramen   Needle size: 22 G  Needle type: Spinal  Needle Placement: Transforaminal  Findings:  -Contrast Used: 1 mL iohexol 180 mg iodine/mL   -Comments: Excellent flow of contrast along the nerve and into the epidural space. Flow of contrast on this injection and the last injection does not seem to want to wrap around the pedicle like he'll typically see in a back that has not had surgery. I wonder if there is any level of scar tissue present.  Procedure Details: After squaring off the sacral end-plate to get a true AP view, the C-arm was positioned so that the best possible view of the S1 foramen was visualized. The soft  tissues overlying this structure were infiltrated with 2-3 ml. of 1% Lidocaine without Epinephrine.    The spinal needle was inserted toward the target using a "trajectory" view along the fluoroscope beam.  Under AP and lateral visualization, the needle was advanced so it did not puncture dura. Biplanar projections were used to confirm position. Aspiration was confirmed to be negative for CSF and/or blood. A 1-2 ml. volume of Isovue-250 was injected and flow of contrast was noted at each level. Radiographs were obtained for documentation purposes.   After attaining the desired flow of contrast documented above, a 0.5 to 1.0 ml test dose of 0.25% Marcaine was injected into each respective transforaminal space.  The patient was observed for 90 seconds post injection.  After no sensory deficits were reported, and normal lower extremity motor function was noted,   the above injectate was administered so that equal amounts of the injectate were placed at each foramen (level) into the transforaminal epidural space.   Additional Comments:  No complications occurred Dressing: Band-Aid    Post-procedure details: Patient was observed during the procedure. Post-procedure instructions were reviewed.  Patient left the clinic in stable condition.

## 2017-05-16 NOTE — Telephone Encounter (Signed)
I called and advised pt that she can pick up her rx

## 2017-05-19 ENCOUNTER — Ambulatory Visit
Admission: RE | Admit: 2017-05-19 | Discharge: 2017-05-19 | Disposition: A | Discharge status: Home / Self Care | Payer: No Typology Code available for payment source | Source: Ambulatory Visit | Attending: Specialist | Admitting: Specialist

## 2017-05-19 DIAGNOSIS — M4326 Fusion of spine, lumbar region: Secondary | ICD-10-CM

## 2017-05-19 DIAGNOSIS — M961 Postlaminectomy syndrome, not elsewhere classified: Secondary | ICD-10-CM

## 2017-05-27 ENCOUNTER — Other Ambulatory Visit (INDEPENDENT_AMBULATORY_CARE_PROVIDER_SITE_OTHER): Payer: Self-pay | Admitting: Specialist

## 2017-05-27 NOTE — Telephone Encounter (Signed)
PT REQUESTED REFILL OF MORPHINE TABS. PT OUT OF TOWN TOMORROW AFTERNOON TO [place] AND WANTS TO KNOW IF SHE CAN PICK UP TODAY OR TOMORROW PLEASE.  [phone]

## 2017-05-28 MED ORDER — MORPHINE SULFATE 15 MG PO TABS: 15.0000 mg | ORAL_TABLET | ORAL | 0 refills | Status: DC | PRN

## 2017-05-29 ENCOUNTER — Ambulatory Visit (INDEPENDENT_AMBULATORY_CARE_PROVIDER_SITE_OTHER): Payer: Non-veteran care | Admitting: Specialist

## 2017-05-29 ENCOUNTER — Encounter (INDEPENDENT_AMBULATORY_CARE_PROVIDER_SITE_OTHER): Payer: Self-pay | Admitting: Specialist

## 2017-05-29 VITALS — BP 121/87 | HR 95 | Ht 67.0 in | Wt 170.0 lb

## 2017-05-29 DIAGNOSIS — M961 Postlaminectomy syndrome, not elsewhere classified: Secondary | ICD-10-CM | POA: Diagnosis not present

## 2017-05-29 DIAGNOSIS — M4326 Fusion of spine, lumbar region: Secondary | ICD-10-CM | POA: Diagnosis not present

## 2017-05-29 NOTE — Telephone Encounter (Signed)
Patient picked up rx at her appt,

## 2017-05-29 NOTE — Patient Instructions (Signed)
Plan: Avoid frequent bending and stooping  No lifting greater than 10 lbs. May use ice or moist heat for pain. Weight loss is of benefit. Handicap license is approved. Pain management is appropriate for treatment of her pain chronically. A last or third ESI is a reasonable way to to approach her pain, for now and also look to instituting the pain management Program for long term maintenance of her meds. An indwelling spinal cord stimulator trial in the past was complicated with a CSF leak and HAs A second trial may be in order if the pain is not controlled with ESIs and if she is unable to make progress in decreasing her narcotic meds.

## 2017-05-29 NOTE — Progress Notes (Signed)
Office Visit Note   Patient: [name] [name]           Date of Birth: [birth year]           MRN: [id number] Visit Date: 05/29/2017              Requested by: Buzzy Han, MD [address] , [place] [address] PCP: Buzzy Han, MD   Assessment & Plan: Visit Diagnoses:  1. Lumbar post-laminectomy syndrome   2. Fusion of spine of lumbar region     Plan: Avoid frequent bending and stooping  No lifting greater than 10 lbs. May use ice or moist heat for pain. Weight loss is of benefit. Handicap license is approved. Pain management is appropriate for treatment of her pain chronically. A last or third ESI is a reasonable way to to approach her pain, for now and also look to instituting the pain management Program for long term maintenance of her meds. An indwelling spinal cord stimulator trial in the past was complicated with a CSF leak and HAs A second trial may be in order if the pain is not controlled with ESIs and if she is unable to make progress in decreasing her narcotic meds.    Follow-Up Instructions: No Follow-up on file.   Orders:  No orders of the defined types were placed in this encounter.  No orders of the defined types were placed in this encounter.     Procedures: No procedures performed   Clinical Data: No additional findings.   Subjective: Chief Complaint  Patient presents with  . Lower Back - Follow-up    Post CT Scan--Lumbar  Had Left S1 Transforaminal injection with Dr. Ernestina Patches on 05/15/17    46 year old female with persisting sharp pain left leg that is shooting into the left leg. It radiates along the left lateral foot. Notices difficulty with wearing shoes. Notices weakness in the left foot dorsiflexion and plantar flexion. Results of the CT on her lumbar spine are encouraging, with suggestion of healing of the L5-S1 fusion site, no complicating features. No bowel difficulties, she notices  problems with incontinence.     Review of Systems  Constitutional: Negative.  Negative for activity change, appetite change, chills, diaphoresis, fatigue, fever and unexpected weight change.  HENT: Negative for dental problem, drooling, ear discharge, mouth sores, sore throat, tinnitus and trouble swallowing.   Eyes: Negative for photophobia, pain, redness and itching.  Respiratory: Positive for cough.   Cardiovascular: Positive for leg swelling. Negative for chest pain and palpitations.  Gastrointestinal: Negative for abdominal distention, anal bleeding, diarrhea and nausea.  Endocrine: Negative.  Negative for cold intolerance and heat intolerance.  Genitourinary: Positive for difficulty urinating. Negative for decreased urine volume, enuresis, flank pain, frequency, hematuria, menstrual problem, pelvic pain, urgency, vaginal bleeding, vaginal discharge and vaginal pain.  Musculoskeletal: Positive for arthralgias and back pain.  Skin: Negative.  Negative for color change, pallor, rash and wound.  Allergic/Immunologic: Positive for environmental allergies and food allergies.  Neurological: Negative.  Negative for dizziness, tremors, seizures, syncope, facial asymmetry, weakness, light-headedness and headaches.  Hematological: Negative.  Negative for adenopathy. Does not bruise/bleed easily.  Psychiatric/Behavioral: Negative.  Negative for agitation, behavioral problems, confusion, decreased concentration, dysphoric mood, hallucinations, self-injury, sleep disturbance and suicidal ideas. The patient is not nervous/anxious and is not hyperactive.      Objective: Vital Signs: BP 121/87 (BP Location: Left Arm, Patient Position: Sitting)   Pulse 95   Ht 5\' 7"  (1.702  m)   Wt 170 lb (77.1 kg)   BMI 26.63 kg/m   Physical Exam  Constitutional: She is oriented to person, place, and time. She appears well-developed and well-nourished.  HENT:  Head: Normocephalic and atraumatic.  Eyes: EOM are  normal. Pupils are equal, round, and reactive to light.  Neck: Normal range of motion. Neck supple.  Pulmonary/Chest: Effort normal and breath sounds normal.  Abdominal: Soft. Bowel sounds are normal.  Musculoskeletal: Normal range of motion.  Neurological: She is alert and oriented to person, place, and time.  Skin: Skin is warm and dry.  Psychiatric: She has a normal mood and affect. Her behavior is normal. Judgment and thought content normal.    Ortho Exam  Specialty Comments:  No specialty comments available.  Imaging: No results found.   PMFS History: Patient Active Problem List   Diagnosis Date Noted  . S/P lumbar and lumbosacral fusion by anterior technique 10/09/2016    Priority: High    Class: Status post  . Disc disease, degenerative, lumbar or lumbosacral 08/26/2016    Priority: High    Class: Chronic  . Herniated nucleus pulposus, lumbar 12/10/2013    Priority: High  . Right lower quadrant pain 06/06/2014  . S/P hysterectomy 02/09/2013  . Pulmonary embolism ([place])   . Protein C deficiency ([place])   . Fibroids   . Endometriosis    Past Medical History:  Diagnosis Date  . Chronic back pain    stenosis and HNP  . History of pulmonary embolus (PE) 03/2008  . Pneumonia    "walking" in the 90's  . Urinary urgency   . Weakness    numbness and tingling on left side    No family history on file.  Past Surgical History:  Procedure Laterality Date  . ABDOMINAL EXPOSURE N/A 08/26/2016   Procedure: ABDOMINAL EXPOSURE;  Surgeon: Rosetta Posner, MD;  Location: [facility];  Service: Vascular;  Laterality: N/A;  . ABDOMINAL HYSTERECTOMY    . ANTERIOR LUMBAR FUSION N/A 08/26/2016   Procedure: ANTERIOR LUMBAR FUSION L5-S1, EXCISION OF HERNIATED DISC LEFT L5-S1, ZERO P LUMBAR INTERBODY IMPLANT, LEFT ILIAC CREST BONE GRAFT ;  Surgeon: Jessy Oto, MD;  Location: [facility];  Service: Orthopedics;  Laterality: N/A;  . BACK SURGERY    . DIAGNOSTIC LAPAROSCOPY  2004   x3  . DILATION  AND EVACUATION N/A 01/28/2013   Procedure: DILATATION AND EVACUATION;  Surgeon: Princess Bruins, MD;  Location: [facility];  Service: Gynecology;  Laterality: N/A;  . KNEE ARTHROSCOPY Left    x 3  . LUMBAR LAMINECTOMY N/A 12/10/2013   Procedure: LEFT L5-S1 MICRODISCECTOMY USING MIS;  Surgeon: Jessy Oto, MD;  Location: [facility];  Service: Orthopedics;  Laterality: N/A;  . LUMBAR LAMINECTOMY N/A 12/30/2014   Procedure: LEFT L5-S1 Redo MICRODISCECTOMY;  Surgeon: Jessy Oto, MD;  Location: [facility];  Service: Orthopedics;  Laterality: N/A;  . ROBOTIC ASSISTED TOTAL HYSTERECTOMY N/A 02/09/2013   Procedure: ROBOTIC ASSISTED TOTAL HYSTERECTOMY;  Surgeon: Princess Bruins, MD;  Location: [facility];  Service: Gynecology;  Laterality: N/A;  . UNILATERAL SALPINGECTOMY Right 02/09/2013   Procedure: UNILATERAL SALPINGECTOMY;  Surgeon: Princess Bruins, MD;  Location: [facility];  Service: Gynecology;  Laterality: Right;  . WISDOM TOOTH EXTRACTION     Social History   Occupational History  . Not on file.   Social History Main Topics  . Smoking status: Current Every Day Smoker    Packs/day: 0.50    Years: 25.00  Types: Cigarettes  . Smokeless tobacco: Never Used  . Alcohol use Yes  . Drug use: No  . Sexual activity: Not Currently    Birth control/ protection: Surgical

## 2017-06-10 ENCOUNTER — Other Ambulatory Visit (INDEPENDENT_AMBULATORY_CARE_PROVIDER_SITE_OTHER): Payer: Self-pay

## 2017-06-10 NOTE — Addendum Note (Signed)
Addended by: Minda Ditto, Geoffery Spruce on: 06/10/2017 03:47 PM   Modules accepted: Orders

## 2017-06-10 NOTE — Telephone Encounter (Signed)
Patient would like a Rx refill on Morphine and would like a call back from you.  Cb# is [phone].

## 2017-06-13 ENCOUNTER — Telehealth (INDEPENDENT_AMBULATORY_CARE_PROVIDER_SITE_OTHER): Payer: Self-pay

## 2017-06-13 NOTE — Telephone Encounter (Signed)
I called and advised pt that Dr. Louanne Skye has her messages in his box regarding her rx.  She states that [employer] will be sending a request for records and she has a form that needs to be completed.

## 2017-06-13 NOTE — Telephone Encounter (Signed)
Patient would like a call from you and advised her that Nesbitt was sent to Dr. Louanne Skye concerning Rx refill.  Cb# [phone].

## 2017-06-16 MED ORDER — MORPHINE SULFATE 15 MG PO TABS: 15.0000 mg | ORAL_TABLET | ORAL | 0 refills | Status: DC | PRN

## 2017-06-16 NOTE — Telephone Encounter (Signed)
I called and advised that rx was ready for pick up

## 2017-06-20 ENCOUNTER — Other Ambulatory Visit (INDEPENDENT_AMBULATORY_CARE_PROVIDER_SITE_OTHER): Payer: Self-pay | Admitting: Specialist

## 2017-06-20 DIAGNOSIS — M961 Postlaminectomy syndrome, not elsewhere classified: Secondary | ICD-10-CM

## 2017-06-20 MED ORDER — PREGABALIN 75 MG PO CAPS: 75.0000 mg | ORAL_CAPSULE | Freq: Two times a day (BID) | ORAL | 6 refills | Status: DC

## 2017-06-20 MED ORDER — MORPHINE SULFATE 15 MG PO TABS: 15.0000 mg | ORAL_TABLET | ORAL | 0 refills | Status: DC | PRN

## 2017-07-11 ENCOUNTER — Other Ambulatory Visit (INDEPENDENT_AMBULATORY_CARE_PROVIDER_SITE_OTHER): Payer: Self-pay | Admitting: Specialist

## 2017-07-11 NOTE — Telephone Encounter (Signed)
Sent to [facility]  

## 2017-07-11 NOTE — Telephone Encounter (Signed)
Pt called for refill morphine. She states she has two left. Pt also req return call to advise if she needs another injection scheduled.

## 2017-07-14 MED ORDER — MORPHINE SULFATE 15 MG PO TABS: 15.0000 mg | ORAL_TABLET | ORAL | 0 refills | Status: DC | PRN

## 2017-07-15 NOTE — Telephone Encounter (Signed)
Patient is aware rx is ready for pick up at the front desk 

## 2017-07-24 ENCOUNTER — Other Ambulatory Visit (INDEPENDENT_AMBULATORY_CARE_PROVIDER_SITE_OTHER): Payer: Self-pay

## 2017-07-24 NOTE — Addendum Note (Signed)
Addended by: Minda Ditto, [name] N on: 07/24/2017 11:38 AM   Modules accepted: Orders

## 2017-07-24 NOTE — Telephone Encounter (Signed)
Patient called on yesterday for a Rx refill for Morphine Tabs. Stated that she will be out by the weekend. Cb# [phone].  Please advise.  Thank You.

## 2017-07-25 ENCOUNTER — Telehealth (INDEPENDENT_AMBULATORY_CARE_PROVIDER_SITE_OTHER): Payer: Self-pay | Admitting: *Deleted

## 2017-07-25 MED ORDER — MORPHINE SULFATE 15 MG PO TABS: 15.0000 mg | ORAL_TABLET | ORAL | 0 refills | Status: DC | PRN

## 2017-07-25 NOTE — Telephone Encounter (Signed)
Pt called stating she wanted this before the weekend. I advised pt that I would send a message but Louanne Skye was in clinic this am and is in surgery this pm. Also told pt that the message was sent to [facility] yesterday.

## 2017-07-25 NOTE — Telephone Encounter (Signed)
Pt called stating she wanted this before the weekend. I advised pt that I would send a message but Louanne Skye was in clinic this am and is in surgery this pm. Also told pt that the message was sent to [facility] yesterday

## 2017-08-07 ENCOUNTER — Other Ambulatory Visit (INDEPENDENT_AMBULATORY_CARE_PROVIDER_SITE_OTHER): Payer: Self-pay | Admitting: Specialist

## 2017-08-07 NOTE — Telephone Encounter (Signed)
Patient called needing Rx refilled (Morphine) The number to contact patient is [phone] °

## 2017-08-07 NOTE — Telephone Encounter (Signed)
Rx request, please advise. °

## 2017-08-11 ENCOUNTER — Other Ambulatory Visit (INDEPENDENT_AMBULATORY_CARE_PROVIDER_SITE_OTHER): Payer: Self-pay | Admitting: Radiology

## 2017-08-14 MED ORDER — MORPHINE SULFATE 15 MG PO TABS: 15.0000 mg | ORAL_TABLET | ORAL | 0 refills | Status: DC | PRN

## 2017-08-14 NOTE — Telephone Encounter (Signed)
Patient has picked up her rx

## 2017-08-25 ENCOUNTER — Other Ambulatory Visit (INDEPENDENT_AMBULATORY_CARE_PROVIDER_SITE_OTHER): Payer: Self-pay | Admitting: Specialist

## 2017-08-25 NOTE — Telephone Encounter (Signed)
Patient called asking for a refill on morphine. CB # [phone]

## 2017-08-27 NOTE — Telephone Encounter (Addendum)
Patient is calling to see if her rx is ready for pick up.  She states that she called last Thursday and she is completely out of her meds.  She states that she is having to wait a long time to get her refills.  She has an appt on 08/28/17 @ 845 w/ Dr. Louanne Skye.   -------Patient saw [name] today and she got her rx

## 2017-08-28 ENCOUNTER — Other Ambulatory Visit (INDEPENDENT_AMBULATORY_CARE_PROVIDER_SITE_OTHER): Payer: Self-pay

## 2017-08-28 ENCOUNTER — Ambulatory Visit (INDEPENDENT_AMBULATORY_CARE_PROVIDER_SITE_OTHER): Payer: No Typology Code available for payment source | Admitting: Specialist

## 2017-08-28 ENCOUNTER — Ambulatory Visit (INDEPENDENT_AMBULATORY_CARE_PROVIDER_SITE_OTHER): Payer: Non-veteran care | Admitting: Surgery

## 2017-08-28 ENCOUNTER — Telehealth (INDEPENDENT_AMBULATORY_CARE_PROVIDER_SITE_OTHER): Payer: Self-pay

## 2017-08-28 DIAGNOSIS — Z981 Arthrodesis status: Secondary | ICD-10-CM

## 2017-08-28 DIAGNOSIS — G8929 Other chronic pain: Secondary | ICD-10-CM

## 2017-08-28 DIAGNOSIS — M533 Sacrococcygeal disorders, not elsewhere classified: Secondary | ICD-10-CM

## 2017-08-28 DIAGNOSIS — N39498 Other specified urinary incontinence: Secondary | ICD-10-CM

## 2017-08-28 DIAGNOSIS — M5442 Lumbago with sciatica, left side: Secondary | ICD-10-CM

## 2017-08-28 DIAGNOSIS — N393 Stress incontinence (female) (male): Secondary | ICD-10-CM

## 2017-08-28 MED ORDER — MORPHINE SULFATE 15 MG PO TABS: 15.0000 mg | ORAL_TABLET | ORAL | 0 refills | Status: DC | PRN

## 2017-08-28 NOTE — Telephone Encounter (Signed)
Patient said she's supposed to be sent to pain management, but I don't see anything?

## 2017-08-28 NOTE — Telephone Encounter (Signed)
Caryl Pina put in new Order and I messaged Zorita Pang about this, looks like she was the one who denied patient due seeing Dr. Ernestina Patches for ESI , and SCS trial, that have been failed.

## 2017-08-28 NOTE — Progress Notes (Signed)
Office Visit Note   Patient: [name] [name]           Date of Birth: Aug 11, 1971           MRN: [id number] Visit Date: 08/28/2017              Requested by: Buzzy Han, MD [address] , [place] [address] PCP: Buzzy Han, MD   Assessment & Plan: Visit Diagnoses:  1. Chronic left SI joint pain   2. S/P lumbar fusion   3. Stress incontinence of urine     Plan: With patient's increased pain at the left SI joint I will refer to Dr. Ernestina Patches for diagnostic/therapeutic left SI joint injection. Advised patient to pay close attention to how she feels after the injection.  If she has good relief with that we may consider trying radiofrequency ablation. We'll put a referral for pain management today. Patient also needs urology consult and this was ordered as well. Follow-up about 1-2 weeks after she's had a left SI joint injection to check her response. Question needing repeat lumbar MRI as mentioned in last CT report. I definitely think that patient would greatly benefit from pain management.  Follow-Up Instructions: No Follow-up on file.   Orders:  No orders of the defined types were placed in this encounter.  No orders of the defined types were placed in this encounter.     Procedures: No procedures performed   Clinical Data: No additional findings.   Subjective: No chief complaint on file.   HPI Patient returns for recheck. States that she has not heard back in regards to pain management referral that was discussed last office visit. She continues to have ongoing pain in the left low back with radicular pain to left foot. Localizes most of her back pain to the left SI joint. Pain increased when she is ambulating, standing and sitting. Status post previous L5-S1 fusion. States that she continues to have ongoing issues with urinary incontinence which is more stress related. States that she does feel the urge to go but just  cannot make it at times to the bathroom. Also aggravated with coughing sneezing etc. Has not seen urology.   States her primary care physician has put her on medications to help this issue. Patient denies family history or personal history of inflammatory arthropathy. Review of Systems No current cardiac pulmonary GI issues.  Objective: Vital Signs: There were no vitals taken for this visit.  Physical Exam  Constitutional: She is oriented to person, place, and time. She appears well-developed. No distress.  HENT:  Head: Normocephalic and atraumatic.  Eyes: Pupils are equal, round, and reactive to light. EOM are normal.  Neck: Normal range of motion.  Pulmonary/Chest: No respiratory distress.  Abdominal: She exhibits no distension.  Musculoskeletal:  Gait is antalgic. She is exquisitely tender over the left SI joint. Nontender over the right side. Positive left FABER test.  Negative on the right side. Negative logroll bilateral hips.  Neurological: She is alert and oriented to person, place, and time.  Skin: Skin is warm and dry.  Psychiatric: She has a normal mood and affect.    Ortho Exam  Specialty Comments:  No specialty comments available.  Imaging: No results found.   PMFS History: Patient Active Problem List   Diagnosis Date Noted  . S/P lumbar and lumbosacral fusion by anterior technique 10/09/2016    Class: Status post  . Disc disease, degenerative, lumbar or lumbosacral 08/26/2016  Class: Chronic  . Right lower quadrant pain 06/06/2014  . Herniated nucleus pulposus, lumbar 12/10/2013  . S/P hysterectomy 02/09/2013  . Pulmonary embolism ([place])   . Protein C deficiency ([place])   . Fibroids   . Endometriosis    Past Medical History:  Diagnosis Date  . Chronic back pain    stenosis and HNP  . History of pulmonary embolus (PE) 03/2008  . Pneumonia    "walking" in the 90's  . Urinary urgency   . Weakness    numbness and tingling on left side    No family  history on file.  Past Surgical History:  Procedure Laterality Date  . ABDOMINAL EXPOSURE N/A 08/26/2016   Procedure: ABDOMINAL EXPOSURE;  Surgeon: Rosetta Posner, MD;  Location: [facility];  Service: Vascular;  Laterality: N/A;  . ABDOMINAL HYSTERECTOMY    . ANTERIOR LUMBAR FUSION N/A 08/26/2016   Procedure: ANTERIOR LUMBAR FUSION L5-S1, EXCISION OF HERNIATED DISC LEFT L5-S1, ZERO P LUMBAR INTERBODY IMPLANT, LEFT ILIAC CREST BONE GRAFT ;  Surgeon: Jessy Oto, MD;  Location: [facility];  Service: Orthopedics;  Laterality: N/A;  . BACK SURGERY    . DIAGNOSTIC LAPAROSCOPY  2004   x3  . DILATION AND EVACUATION N/A 01/28/2013   Procedure: DILATATION AND EVACUATION;  Surgeon: Princess Bruins, MD;  Location: [facility];  Service: Gynecology;  Laterality: N/A;  . KNEE ARTHROSCOPY Left    x 3  . LUMBAR LAMINECTOMY N/A 12/10/2013   Procedure: LEFT L5-S1 MICRODISCECTOMY USING MIS;  Surgeon: Jessy Oto, MD;  Location: [facility];  Service: Orthopedics;  Laterality: N/A;  . LUMBAR LAMINECTOMY N/A 12/30/2014   Procedure: LEFT L5-S1 Redo MICRODISCECTOMY;  Surgeon: Jessy Oto, MD;  Location: [facility];  Service: Orthopedics;  Laterality: N/A;  . ROBOTIC ASSISTED TOTAL HYSTERECTOMY N/A 02/09/2013   Procedure: ROBOTIC ASSISTED TOTAL HYSTERECTOMY;  Surgeon: Princess Bruins, MD;  Location: [facility];  Service: Gynecology;  Laterality: N/A;  . UNILATERAL SALPINGECTOMY Right 02/09/2013   Procedure: UNILATERAL SALPINGECTOMY;  Surgeon: Princess Bruins, MD;  Location: [facility];  Service: Gynecology;  Laterality: Right;  . WISDOM TOOTH EXTRACTION     Social History   Occupational History  . Not on file.   Social History Main Topics  . Smoking status: Current Every Day Smoker    Packs/day: 0.50    Years: 25.00    Types: Cigarettes  . Smokeless tobacco: Never Used  . Alcohol use Yes  . Drug use: No  . Sexual activity: Not Currently    Birth control/ protection: Surgical

## 2017-09-02 NOTE — Telephone Encounter (Signed)
Rxs have been renewed, most recently by Benjiman Core at an office visit last week. jen

## 2017-09-03 ENCOUNTER — Telehealth (INDEPENDENT_AMBULATORY_CARE_PROVIDER_SITE_OTHER): Payer: Self-pay | Admitting: Radiology

## 2017-09-03 NOTE — Telephone Encounter (Signed)
They are needing the insurance for this patient.  Please call Aram Beecham to advise.

## 2017-09-04 ENCOUNTER — Ambulatory Visit (INDEPENDENT_AMBULATORY_CARE_PROVIDER_SITE_OTHER): Payer: Self-pay

## 2017-09-04 ENCOUNTER — Ambulatory Visit (INDEPENDENT_AMBULATORY_CARE_PROVIDER_SITE_OTHER): Payer: Non-veteran care | Admitting: Physical Medicine and Rehabilitation

## 2017-09-04 ENCOUNTER — Encounter (INDEPENDENT_AMBULATORY_CARE_PROVIDER_SITE_OTHER): Payer: Self-pay | Admitting: Physical Medicine and Rehabilitation

## 2017-09-04 DIAGNOSIS — M461 Sacroiliitis, not elsewhere classified: Secondary | ICD-10-CM

## 2017-09-04 NOTE — Telephone Encounter (Signed)
Insurance [facility]ed to [facility]

## 2017-09-04 NOTE — Progress Notes (Signed)
SHANTERICA BIEHLER - 46 y.o. female MRN [id number]  Date of birth: 05-Feb-1971  Office Visit Note: Visit Date: 09/04/2017 PCP: Buzzy Han, MD Referred by: Buzzy Han*  Subjective: Chief Complaint  Patient presents with  . Lower Back - Pain   HPI: Mrs. Danelle Earthly is a very pleasant 87 her old female with prior lumbar fusion at L5-S1. We've seen her over the years with her history of herniated disc 2 and then she underwent anterior lumbar interbody fusion at L5-S1 on September 2017. She's had continued left low back and buttock pain with referral leg. The last time I saw her was in May and completed S1 transforaminal injection on the left really without much relief. Benjiman Core, PA-C saw her recently and suggested diagnostic sacroiliac joint injection which was reviewed by Dr. Louanne Skye. She is here today for that injection. She also has a referral in for transfer care to [facility] pain [facility]. Depending on the outcome of that referral they will be able to take over injections for her.    ROS Otherwise per HPI.  Assessment & Plan: Visit Diagnoses:  1. Sacroiliitis ([place])     Plan: Findings:  Diagnostic and therapeutic sacroiliac joint injection. As per flow of contrast throughout the joint. Patient didn't seem to have much relief during the anesthetic phase.    Meds & Orders: No orders of the defined types were placed in this encounter.   Orders Placed This Encounter  Procedures  . Large Joint Injection/Arthrocentesis  . XR C-ARM NO REPORT    Follow-up: Return if symptoms worsen or fail to improve, for Benjiman Core, PA-C.   Procedures: Sacroiliac joint injection with fluoroscopic guidance Date/Time: 09/04/2017 3:23 PM Performed by: Magnus Sinning Authorized by: Magnus Sinning   Consent Given by:  Patient Site marked: the procedure site was marked   Timeout: prior to procedure the correct patient, procedure, and site was verified   Indications:   Pain and diagnostic evaluation Location:  Sacroiliac Site:  L sacroiliac joint Prep: patient was prepped and draped in usual sterile fashion   Needle Size:  22 G Needle Length:  3.5 inches Approach:  Posterior Ultrasound Guidance: No   Fluoroscopic Guidance: Yes   Arthrogram: No   Medications:  2 mL bupivacaine 0.5 %; 80 mg methylPREDNISolone acetate 80 MG/ML Aspiration Attempted: No   Patient tolerance:  Patient tolerated the procedure well with no immediate complications  There was excellent flow of contrast producing a partial arthrogram of the sacroiliac joint.      No notes on file   Clinical History: No specialty comments available.  She reports that she has been smoking Cigarettes.  She has a 12.50 pack-year smoking history. She has never used smokeless tobacco. No results for input(s): HGBA1C, LABURIC in the last 8760 hours.  Objective:  VS:  HT:    WT:   BMI:     BP:   HR: bpm  TEMP: ( )  RESP:  Physical Exam  Musculoskeletal:  Positive Fortin finger sign on the left. She has some pain with external rotation with equivocal Patrick's exam.    Ortho Exam Imaging: No results found.  Past Medical/Family/Surgical/Social History: Medications & Allergies reviewed per EMR Patient Active Problem List   Diagnosis Date Noted  . S/P lumbar and lumbosacral fusion by anterior technique 10/09/2016    Class: Status post  . Disc disease, degenerative, lumbar or lumbosacral 08/26/2016    Class: Chronic  . Right lower quadrant pain 06/06/2014  . Herniated  nucleus pulposus, lumbar 12/10/2013  . S/P hysterectomy 02/09/2013  . Pulmonary embolism ([place])   . Protein C deficiency ([place])   . Fibroids   . Endometriosis    Past Medical History:  Diagnosis Date  . Chronic back pain    stenosis and HNP  . History of pulmonary embolus (PE) 03/2008  . Pneumonia    "walking" in the 90's  . Urinary urgency   . Weakness    numbness and tingling on left side   No family history on  file. Past Surgical History:  Procedure Laterality Date  . ABDOMINAL EXPOSURE N/A 08/26/2016   Procedure: ABDOMINAL EXPOSURE;  Surgeon: Rosetta Posner, MD;  Location: [facility];  Service: Vascular;  Laterality: N/A;  . ABDOMINAL HYSTERECTOMY    . ANTERIOR LUMBAR FUSION N/A 08/26/2016   Procedure: ANTERIOR LUMBAR FUSION L5-S1, EXCISION OF HERNIATED DISC LEFT L5-S1, ZERO P LUMBAR INTERBODY IMPLANT, LEFT ILIAC CREST BONE GRAFT ;  Surgeon: Jessy Oto, MD;  Location: [facility];  Service: Orthopedics;  Laterality: N/A;  . BACK SURGERY    . DIAGNOSTIC LAPAROSCOPY  2004   x3  . DILATION AND EVACUATION N/A 01/28/2013   Procedure: DILATATION AND EVACUATION;  Surgeon: Princess Bruins, MD;  Location: [facility];  Service: Gynecology;  Laterality: N/A;  . KNEE ARTHROSCOPY Left    x 3  . LUMBAR LAMINECTOMY N/A 12/10/2013   Procedure: LEFT L5-S1 MICRODISCECTOMY USING MIS;  Surgeon: Jessy Oto, MD;  Location: [facility];  Service: Orthopedics;  Laterality: N/A;  . LUMBAR LAMINECTOMY N/A 12/30/2014   Procedure: LEFT L5-S1 Redo MICRODISCECTOMY;  Surgeon: Jessy Oto, MD;  Location: [facility];  Service: Orthopedics;  Laterality: N/A;  . ROBOTIC ASSISTED TOTAL HYSTERECTOMY N/A 02/09/2013   Procedure: ROBOTIC ASSISTED TOTAL HYSTERECTOMY;  Surgeon: Princess Bruins, MD;  Location: [facility];  Service: Gynecology;  Laterality: N/A;  . UNILATERAL SALPINGECTOMY Right 02/09/2013   Procedure: UNILATERAL SALPINGECTOMY;  Surgeon: Princess Bruins, MD;  Location: [facility];  Service: Gynecology;  Laterality: Right;  . WISDOM TOOTH EXTRACTION     Social History   Occupational History  . Not on file.   Social History Main Topics  . Smoking status: Current Every Day Smoker    Packs/day: 0.50    Years: 25.00    Types: Cigarettes  . Smokeless tobacco: Never Used  . Alcohol use Yes  . Drug use: No  . Sexual activity: Not Currently    Birth control/ protection: Surgical

## 2017-09-04 NOTE — Patient Instructions (Signed)

## 2017-09-04 NOTE — Progress Notes (Deleted)
Left side low back and buttock pain. Radiates down leg to toes. Constant pain. Can't get comfortable. Worse first thing in the morning.

## 2017-09-08 ENCOUNTER — Other Ambulatory Visit (INDEPENDENT_AMBULATORY_CARE_PROVIDER_SITE_OTHER): Payer: Self-pay | Admitting: Radiology

## 2017-09-08 ENCOUNTER — Telehealth (INDEPENDENT_AMBULATORY_CARE_PROVIDER_SITE_OTHER): Payer: Self-pay

## 2017-09-08 MED ORDER — MORPHINE SULFATE 15 MG PO TABS: 15.0000 mg | ORAL_TABLET | ORAL | 0 refills | Status: DC | PRN

## 2017-09-08 NOTE — Telephone Encounter (Signed)
Patient called stating that Trudy from a physical therapy facility called her stating that she needed the complete [employer] insurance in order to schedule patient appointment. Trudy's phone number is [phone].  Patient cb# is [phone]. Please advise.  Thank You.

## 2017-09-08 NOTE — Telephone Encounter (Signed)
Can you please help on this? According to last message you had sent over her insurance info.

## 2017-09-09 ENCOUNTER — Other Ambulatory Visit (INDEPENDENT_AMBULATORY_CARE_PROVIDER_SITE_OTHER): Payer: Self-pay | Admitting: Radiology

## 2017-09-09 ENCOUNTER — Telehealth (INDEPENDENT_AMBULATORY_CARE_PROVIDER_SITE_OTHER): Payer: Self-pay | Admitting: Specialist

## 2017-09-09 MED ORDER — MORPHINE SULFATE 15 MG PO TABS: 15.0000 mg | ORAL_TABLET | ORAL | 0 refills | Status: DC | PRN

## 2017-09-09 NOTE — Telephone Encounter (Signed)
I called patient and advised that Dr. Louanne Skye did approve her rx last night however we can not find it, so I reprinted the rx and will have Dr. Louanne Skye to sign it first thing in the morning and she will be able to get it after 9:15am tomorrow.

## 2017-09-09 NOTE — Telephone Encounter (Signed)
Patient called asking about her prescription for morphine? CB # [phone]

## 2017-09-10 NOTE — Telephone Encounter (Signed)
Patient is aware that her is will be at front desk for pick up anytime after 915 today

## 2017-09-10 NOTE — Telephone Encounter (Signed)
Can you help me in finding this insurance information, I only saw where there was a form from the [place] but nothing with all the information. Thanks

## 2017-09-14 MED ORDER — METHYLPREDNISOLONE ACETATE 80 MG/ML IJ SUSP
80.0000 mg | INTRAMUSCULAR | Status: AC | PRN
  Administered 2017-09-04: 80 mg via INTRA_ARTICULAR

## 2017-09-14 MED ORDER — BUPIVACAINE HCL 0.5 % IJ SOLN
2.0000 mL | INTRAMUSCULAR | Status: AC | PRN
  Administered 2017-09-04: 2 mL via INTRA_ARTICULAR

## 2017-09-19 ENCOUNTER — Other Ambulatory Visit (INDEPENDENT_AMBULATORY_CARE_PROVIDER_SITE_OTHER): Payer: Self-pay | Admitting: Specialist

## 2017-09-19 NOTE — Telephone Encounter (Signed)
Pt needs Morphine refill/ Pt is still waiting for a phone from pain management

## 2017-09-22 MED ORDER — MORPHINE SULFATE 15 MG PO TABS: 15.0000 mg | ORAL_TABLET | ORAL | 0 refills | Status: DC | PRN

## 2017-09-23 NOTE — Telephone Encounter (Signed)
Pt is aware that her rx is ready for pick up

## 2017-10-01 ENCOUNTER — Other Ambulatory Visit (INDEPENDENT_AMBULATORY_CARE_PROVIDER_SITE_OTHER): Payer: Self-pay | Admitting: Specialist

## 2017-10-01 NOTE — Telephone Encounter (Signed)
Tameeka, Luo  17-Nov-2071  Pt need med refill Morphine (MSIR) 15 mg tablet

## 2017-10-02 MED ORDER — MORPHINE SULFATE 15 MG PO TABS: 15.0000 mg | ORAL_TABLET | ORAL | 0 refills | Status: DC | PRN

## 2017-10-03 NOTE — Telephone Encounter (Signed)
Patient is aware her rx is ready for pick up 

## 2017-10-08 ENCOUNTER — Encounter (HOSPITAL_COMMUNITY): Payer: Self-pay | Admitting: Emergency Medicine

## 2017-10-08 ENCOUNTER — Emergency Department (HOSPITAL_COMMUNITY)
Admission: EM | Admit: 2017-10-08 | Discharge: 2017-10-09 | Disposition: A | Discharge status: Home / Self Care | Payer: Non-veteran care | Attending: Emergency Medicine | Admitting: Emergency Medicine

## 2017-10-08 ENCOUNTER — Emergency Department (HOSPITAL_COMMUNITY): Payer: Non-veteran care

## 2017-10-08 DIAGNOSIS — S91011A Laceration without foreign body, right ankle, initial encounter: Secondary | ICD-10-CM

## 2017-10-08 DIAGNOSIS — W260XXA Contact with knife, initial encounter: Secondary | ICD-10-CM | POA: Diagnosis not present

## 2017-10-08 DIAGNOSIS — S99911A Unspecified injury of right ankle, initial encounter: Secondary | ICD-10-CM | POA: Diagnosis present

## 2017-10-08 DIAGNOSIS — Z7982 Long term (current) use of aspirin: Secondary | ICD-10-CM | POA: Insufficient documentation

## 2017-10-08 DIAGNOSIS — Y999 Unspecified external cause status: Secondary | ICD-10-CM | POA: Diagnosis not present

## 2017-10-08 DIAGNOSIS — Y92000 Kitchen of unspecified non-institutional (private) residence as  the place of occurrence of the external cause: Secondary | ICD-10-CM | POA: Diagnosis not present

## 2017-10-08 DIAGNOSIS — Y9389 Activity, other specified: Secondary | ICD-10-CM | POA: Diagnosis not present

## 2017-10-08 DIAGNOSIS — Z79899 Other long term (current) drug therapy: Secondary | ICD-10-CM | POA: Insufficient documentation

## 2017-10-08 DIAGNOSIS — F17210 Nicotine dependence, cigarettes, uncomplicated: Secondary | ICD-10-CM | POA: Diagnosis not present

## 2017-10-08 MED ORDER — ACETAMINOPHEN 500 MG PO TABS: 1000.0000 mg | ORAL_TABLET | Freq: Four times a day (QID) | ORAL | 0 refills | Status: DC | PRN

## 2017-10-08 MED ORDER — LIDOCAINE-EPINEPHRINE (PF) 2 %-1:200000 IJ SOLN
10.0000 mL | Freq: Once | INTRAMUSCULAR | Status: AC
  Administered 2017-10-08: 10 mL
  Filled 2017-10-08: qty 20

## 2017-10-08 MED ORDER — ACETAMINOPHEN 500 MG PO TABS
1000.0000 mg | ORAL_TABLET | Freq: Once | ORAL | Status: AC
  Administered 2017-10-08: 1000 mg via ORAL
  Filled 2017-10-08: qty 2

## 2017-10-08 NOTE — ED Triage Notes (Addendum)
Pt states she dropped a brand new Ginzu knife on R foot approx 1 1/12 hrs ago.  States knife was long went in deep.  Still bleeding on arrival.  Approx 1 inch laceration.  Bandage applied.  CNS intact.

## 2017-10-08 NOTE — ED Notes (Signed)
DSD applied to right ankle.  Ortho tech paged for ASO splint

## 2017-10-08 NOTE — ED Provider Notes (Signed)
[facility] EMERGENCY DEPARTMENT Provider Note   CSN: [id number] Arrival date & time: 10/08/17  1834     History   Chief Complaint Chief Complaint  Patient presents with  . Extremity Laceration    HPI [name] [name] is a 46 y.o. female presenting with injury of right medial ankle.  Patient states she was in the kitchen when she accidentally knocked a knife off the counter and it struck her medial right ankle.  She reports acute onset of ankle pain at that time.  With holding pressure, patient was able to minimize the bleeding, but when she tried to ambulate, bleeding returned.  She reports significant pain to the medial side of her ankle, worse with touching and movement.  Nothing makes it better, she hasn't taken anything for pain PTA.  She reports decreased sensation of the medial dorsal aspect of the foot.  She is not on blood thinners.  Last tetanus was February 2018.  She has not taken anything for pain.  She denies injury elsewhere.  HPI  Past Medical History:  Diagnosis Date  . Chronic back pain    stenosis and HNP  . History of pulmonary embolus (PE) 03/2008  . Pneumonia    "walking" in the 90's  . Urinary urgency   . Weakness    numbness and tingling on left side    Patient Active Problem List   Diagnosis Date Noted  . S/P lumbar and lumbosacral fusion by anterior technique 10/09/2016    Class: Status post  . Disc disease, degenerative, lumbar or lumbosacral 08/26/2016    Class: Chronic  . Right lower quadrant pain 06/06/2014  . Herniated nucleus pulposus, lumbar 12/10/2013  . S/P hysterectomy 02/09/2013  . Pulmonary embolism ([place])   . Protein C deficiency ([place])   . Fibroids   . Endometriosis     Past Surgical History:  Procedure Laterality Date  . ABDOMINAL EXPOSURE N/A 08/26/2016   Procedure: ABDOMINAL EXPOSURE;  Surgeon: Rosetta Posner, MD;  Location: [facility];  Service: Vascular;  Laterality: N/A;  . ABDOMINAL HYSTERECTOMY    .  ANTERIOR LUMBAR FUSION N/A 08/26/2016   Procedure: ANTERIOR LUMBAR FUSION L5-S1, EXCISION OF HERNIATED DISC LEFT L5-S1, ZERO P LUMBAR INTERBODY IMPLANT, LEFT ILIAC CREST BONE GRAFT ;  Surgeon: Jessy Oto, MD;  Location: [facility];  Service: Orthopedics;  Laterality: N/A;  . BACK SURGERY    . DIAGNOSTIC LAPAROSCOPY  2004   x3  . DILATION AND EVACUATION N/A 01/28/2013   Procedure: DILATATION AND EVACUATION;  Surgeon: Princess Bruins, MD;  Location: [facility];  Service: Gynecology;  Laterality: N/A;  . KNEE ARTHROSCOPY Left    x 3  . LUMBAR LAMINECTOMY N/A 12/10/2013   Procedure: LEFT L5-S1 MICRODISCECTOMY USING MIS;  Surgeon: Jessy Oto, MD;  Location: [facility];  Service: Orthopedics;  Laterality: N/A;  . LUMBAR LAMINECTOMY N/A 12/30/2014   Procedure: LEFT L5-S1 Redo MICRODISCECTOMY;  Surgeon: Jessy Oto, MD;  Location: [facility];  Service: Orthopedics;  Laterality: N/A;  . ROBOTIC ASSISTED TOTAL HYSTERECTOMY N/A 02/09/2013   Procedure: ROBOTIC ASSISTED TOTAL HYSTERECTOMY;  Surgeon: Princess Bruins, MD;  Location: [facility];  Service: Gynecology;  Laterality: N/A;  . UNILATERAL SALPINGECTOMY Right 02/09/2013   Procedure: UNILATERAL SALPINGECTOMY;  Surgeon: Princess Bruins, MD;  Location: [facility];  Service: Gynecology;  Laterality: Right;  . WISDOM TOOTH EXTRACTION      OB History    No data available  Home Medications    Prior to Admission medications   Medication Sig Start Date End Date Taking? Authorizing Provider  acetaminophen (TYLENOL) 500 MG tablet Take 2 tablets (1,000 mg total) by mouth every 6 (six) hours as needed. 10/08/17   Braxston Quinter, PA-C  aspirin EC 325 MG tablet Take 1 tablet (325 mg total) by mouth daily. Patient taking differently: Take 81 mg by mouth daily.  08/30/16   Jessy Oto, MD  morphine (MSIR) 15 MG tablet Take 1 tablet (15 mg total) by mouth every 4 (four) hours as needed for severe pain. 10/02/17   Jessy Oto, MD  pregabalin (LYRICA) 75 MG  capsule Take 1 capsule (75 mg total) by mouth 2 (two) times daily. 06/20/17   Jessy Oto, MD    Family History No family history on file.  Social History Social History  Substance Use Topics  . Smoking status: Current Every Day Smoker    Packs/day: 0.50    Years: 25.00    Types: Cigarettes  . Smokeless tobacco: Never Used  . Alcohol use Yes     Allergies   Hydrocodone-acetaminophen and No known allergies   Review of Systems Review of Systems  Skin: Positive for wound.  Hematological: Does not bruise/bleed easily.     Physical Exam Updated Vital Signs BP 114/87 (BP Location: Right Arm)   Pulse 96   Temp 98.2 F (36.8 C) (Oral)   Resp 18   Ht 5\' 7"  (1.702 m)   SpO2 100%   Physical Exam  Constitutional: She is oriented to person, place, and time. She appears well-developed and well-nourished. No distress.  HENT:  Head: Normocephalic and atraumatic.  Eyes: EOM are normal.  Neck: Normal range of motion.  Pulmonary/Chest: Effort normal.  Abdominal: She exhibits no distension.  Musculoskeletal:  Laceration of medial right ankle with surrounding swelling.  No active bleeding at this time.  Pedal pulses equal bilaterally.  Posterior tibialis pulses intact bilaterally.  Good cap refill.  Patient able to move toes without difficulty.  Unable to range ankle due to pain.  Sensation intact bilaterally. No obvious tendon or nerve exposure of damage seen.  Neurological: She is alert and oriented to person, place, and time.  Skin: Skin is warm. No rash noted.  Psychiatric: She has a normal mood and affect.  Nursing note and vitals reviewed.    ED Treatments / Results  Labs (all labs ordered are listed, but only abnormal results are displayed) Labs Reviewed - No data to display  EKG  EKG Interpretation None       Radiology Dg Ankle Complete Right  Result Date: 10/08/2017 CLINICAL DATA:  Medial ankle laceration with knife. EXAM: RIGHT ANKLE - COMPLETE 3+ VIEW  COMPARISON:  None. FINDINGS: No fracture deformity nor dislocation. The ankle mortise appears congruent and the tibiofibular syndesmosis intact. No destructive bony lesions. Mild medial ankle soft tissue swelling without subcutaneous gas or radiopaque foreign bodies. IMPRESSION: Soft tissue swelling.  No acute osseous process. Electronically Signed   By: Elon Alas M.D.   On: 10/08/2017 22:13    Procedures .Marland KitchenLaceration Repair Date/Time: 10/08/2017 10:45 PM Performed by: Franchot Heidelberg Authorized by: Franchot Heidelberg   Consent:    Consent obtained:  Verbal   Consent given by:  Patient   Risks discussed:  Infection, pain and poor wound healing Anesthesia (see MAR for exact dosages):    Anesthesia method:  Local infiltration   Local anesthetic:  Lidocaine 2% WITH epi Laceration details:  Location:  Foot   Foot location:  R ankle   Length (cm):  1   Depth (mm):  2 Repair type:    Repair type:  Simple Pre-procedure details:    Preparation:  Imaging obtained to evaluate for foreign bodies and patient was prepped and draped in usual sterile fashion Exploration:    Wound exploration: wound explored through full range of motion and entire depth of wound probed and visualized     Wound exploration comment:  Wound explored to its fullest extent in a nonbloody field Treatment:    Area cleansed with:  Betadine   Amount of cleaning:  Standard   Irrigation solution:  Sterile saline   Irrigation volume:  250   Irrigation method:  Syringe Skin repair:    Repair method:  Sutures   Suture size:  5-0   Suture material:  Prolene   Suture technique:  Simple interrupted   Number of sutures:  2 Approximation:    Approximation:  Close Post-procedure details:    Dressing:  Sterile dressing   Patient tolerance of procedure:  Tolerated well, no immediate complications   (including critical care time)  Medications Ordered in ED Medications  acetaminophen (TYLENOL) tablet 1,000 mg  (1,000 mg Oral Given 10/08/17 2225)  lidocaine-EPINEPHrine (XYLOCAINE W/EPI) 2 %-1:200000 (PF) injection 10 mL (10 mLs Infiltration Given 10/08/17 2235)     Initial Impression / Assessment and Plan / ED Course  I have reviewed the triage vital signs and the nursing notes.  Pertinent labs & imaging results that were available during my care of the patient were reviewed by me and considered in my medical decision making (see chart for details).     Pt presenting with laceration of medial right ankle from a knife.  Reporting significant pain of the medial ankle.  Physical exam shows laceration with no bleeding and surrounding swelling.  Patient neurovascularly intact.  Will obtain x-ray to ensure no fracture.  Tylenol given for pain  X-ray without fracture or acute abnormality.  Will numb, irrigate, and repair laceration.  Patient given aftercare instructions for sutures.  Patient with significant pain with movement of her ankle after laceration was numbed.  Concern for possible ligamentous injury of ankle.  Pt to use Tylenol for pain.  Patient to follow-up with orthopedics regarding ankle pain.  Placed in ASO brace.  Patient follow-up with primary care for suture removal.  I do not believe antibiotics are necessary at this time.  Tetanus does not need to be updated.  At this time, patient appears safe for discharge.  Return precautions given.  Patient states she understands and agrees to plan.  Final Clinical Impressions(s) / ED Diagnoses   Final diagnoses:  Laceration of right ankle, initial encounter    New Prescriptions Discharge Medication List as of 10/08/2017 11:08 PM    START taking these medications   Details  acetaminophen (TYLENOL) 500 MG tablet Take 2 tablets (1,000 mg total) by mouth every 6 (six) hours as needed., Starting Wed 10/08/2017, Print         Hettick, [place], PA-C 10/09/17 0109    Forde Dandy, MD 10/09/17 [phone]

## 2017-10-08 NOTE — Progress Notes (Signed)
Orthopedic Tech Progress Note Patient Details:  [name] [name] 1971/04/08 [id number]  Ortho Devices Type of Ortho Device: ASO Ortho Device/Splint Location: rle Ortho Device/Splint Interventions: Ordered, Application, Adjustment   Karolee Stamps 10/08/2017, 11:47 PM

## 2017-10-08 NOTE — Discharge Instructions (Signed)
Keep the laceration covered and dry for the next 24 hours.  After this, you may wash gently with soap and water, and reapply dressing.  Keep the area covered until sutures are removed.  Do not soak or submerge in water until sutures are removed. Follow-up with orthopedics for evaluation of your ankle. Follow-up with your primary care doctor in 10 days for suture removal. Use Tylenol 3 times a day as needed for pain.  Continue to take your At Home medications as prescribed. Return to the emergency room if you have color change of your foot, change in sensation of the foot, fevers, pus draining from the cut, or any new or worsening symptoms.

## 2017-10-13 ENCOUNTER — Other Ambulatory Visit (INDEPENDENT_AMBULATORY_CARE_PROVIDER_SITE_OTHER): Payer: Self-pay | Admitting: Specialist

## 2017-10-13 NOTE — Telephone Encounter (Signed)
Rx refill Morphine Tabs

## 2017-10-14 MED ORDER — MORPHINE SULFATE 15 MG PO TABS: 15.0000 mg | ORAL_TABLET | ORAL | 0 refills | Status: DC | PRN

## 2017-10-14 NOTE — Telephone Encounter (Signed)
Pt is aware rx is ready for pick up. 

## 2017-10-16 ENCOUNTER — Encounter (INDEPENDENT_AMBULATORY_CARE_PROVIDER_SITE_OTHER): Payer: Self-pay | Admitting: Specialist

## 2017-10-16 ENCOUNTER — Ambulatory Visit (INDEPENDENT_AMBULATORY_CARE_PROVIDER_SITE_OTHER): Payer: Self-pay | Admitting: Specialist

## 2017-10-16 VITALS — BP 107/74 | HR 78 | Ht 67.0 in | Wt 170.0 lb

## 2017-10-16 DIAGNOSIS — S91311A Laceration without foreign body, right foot, initial encounter: Secondary | ICD-10-CM

## 2017-10-16 NOTE — Progress Notes (Signed)
Office Visit Note   Patient: [name] [name]           Date of Birth: 07-03-1971           MRN: [id number] Visit Date: 10/16/2017              Requested by: Buzzy Han, MD [id number] , [place] [address] PCP: Buzzy Han, MD   Assessment & Plan: Visit Diagnoses:  1. Foot laceration, right, initial encounter     Plan: patient seen with Dr. Sharol Given today. Sutures removed. Dr. Sharol Given felt that her tendons were all intact on exam. Follow-up in one week with [place] for wound check. I was going to put patient in a cam walker but she states that she does have one at home. If this is not too dirty she may use that. Let us know if she needs a new Cam Walker. Do not recommend that she use the ASO brace that was given in the emergency room. Continue elevation as much as possible to help decrease swelling.  Follow-Up Instructions: Return in about 1 week (around 10/23/2017) for Dr Louanne Skye.  wound check.  .   Orders:  No orders of the defined types were placed in this encounter.  No orders of the defined types were placed in this encounter.     Procedures: No procedures performed   Clinical Data: No additional findings.   Subjective: Chief Complaint  Patient presents with  . Right Ankle - Injury    HPI Patient comes in for recheck of her right medial foot. States that 10/08/2017 she was at her home in a kitchen knife fell off a counter and went into her medial foot. She did remove the knife. States that this causes severe bleeding. She was taken to the emergency room. Wound was closed with sutures and patient was put in a ASO brace. Advised to follow-up with orthopedics. States that she obviously has pain medial foot. Some numbness around the area. Review of Systems No current complaints of cardiopulmonary GI GU issues  Objective: Vital Signs: BP 107/74 (BP Location: Left Arm, Patient Position: Sitting)   Pulse 78   Ht 5\' 7"  (1.702  m)   Wt 170 lb (77.1 kg)   BMI 26.63 kg/m   Physical Exam  Constitutional: She is oriented to person, place, and time. She appears well-developed. No distress.  HENT:  Head: Normocephalic and atraumatic.  Eyes: Pupils are equal, round, and reactive to light. EOM are normal.  Neck: Normal range of motion.  Abdominal: She exhibits no distension.  Musculoskeletal:  Patient seen and examined with Dr. Sharol Given today. On exam he felt that all tendons were intact in her foot and ankle. No signs of infection. Sutures were removed and Steri-Strips applied. Neurovascularly intact.  Neurological: She is alert and oriented to person, place, and time.  Skin: Skin is warm and dry.  Psychiatric: She has a normal mood and affect.    Ortho Exam  Specialty Comments:  No specialty comments available.  Imaging: No results found.   PMFS History: Patient Active Problem List   Diagnosis Date Noted  . S/P lumbar and lumbosacral fusion by anterior technique 10/09/2016    Class: Status post  . Disc disease, degenerative, lumbar or lumbosacral 08/26/2016    Class: Chronic  . Right lower quadrant pain 06/06/2014  . Herniated nucleus pulposus, lumbar 12/10/2013  . S/P hysterectomy 02/09/2013  . Pulmonary embolism ([place])   . Protein C deficiency ([place])   .  Fibroids   . Endometriosis    Past Medical History:  Diagnosis Date  . Chronic back pain    stenosis and HNP  . History of pulmonary embolus (PE) 03/2008  . Pneumonia    "walking" in the 90's  . Urinary urgency   . Weakness    numbness and tingling on left side    No family history on file.  Past Surgical History:  Procedure Laterality Date  . ABDOMINAL EXPOSURE N/A 08/26/2016   Procedure: ABDOMINAL EXPOSURE;  Surgeon: Rosetta Posner, MD;  Location: [facility];  Service: Vascular;  Laterality: N/A;  . ABDOMINAL HYSTERECTOMY    . ANTERIOR LUMBAR FUSION N/A 08/26/2016   Procedure: ANTERIOR LUMBAR FUSION L5-S1, EXCISION OF HERNIATED DISC LEFT L5-S1,  ZERO P LUMBAR INTERBODY IMPLANT, LEFT ILIAC CREST BONE GRAFT ;  Surgeon: Jessy Oto, MD;  Location: [facility];  Service: Orthopedics;  Laterality: N/A;  . BACK SURGERY    . DIAGNOSTIC LAPAROSCOPY  2004   x3  . DILATION AND EVACUATION N/A 01/28/2013   Procedure: DILATATION AND EVACUATION;  Surgeon: Princess Bruins, MD;  Location: [facility];  Service: Gynecology;  Laterality: N/A;  . KNEE ARTHROSCOPY Left    x 3  . LUMBAR LAMINECTOMY N/A 12/10/2013   Procedure: LEFT L5-S1 MICRODISCECTOMY USING MIS;  Surgeon: Jessy Oto, MD;  Location: [facility];  Service: Orthopedics;  Laterality: N/A;  . LUMBAR LAMINECTOMY N/A 12/30/2014   Procedure: LEFT L5-S1 Redo MICRODISCECTOMY;  Surgeon: Jessy Oto, MD;  Location: [facility];  Service: Orthopedics;  Laterality: N/A;  . ROBOTIC ASSISTED TOTAL HYSTERECTOMY N/A 02/09/2013   Procedure: ROBOTIC ASSISTED TOTAL HYSTERECTOMY;  Surgeon: Princess Bruins, MD;  Location: [facility];  Service: Gynecology;  Laterality: N/A;  . UNILATERAL SALPINGECTOMY Right 02/09/2013   Procedure: UNILATERAL SALPINGECTOMY;  Surgeon: Princess Bruins, MD;  Location: [facility];  Service: Gynecology;  Laterality: Right;  . WISDOM TOOTH EXTRACTION     Social History   Occupational History  . Not on file.   Social History Main Topics  . Smoking status: Current Every Day Smoker    Packs/day: 0.50    Years: 25.00    Types: Cigarettes  . Smokeless tobacco: Never Used  . Alcohol use Yes  . Drug use: No  . Sexual activity: Not Currently    Birth control/ protection: Surgical

## 2017-10-17 ENCOUNTER — Telehealth (INDEPENDENT_AMBULATORY_CARE_PROVIDER_SITE_OTHER): Payer: Self-pay | Admitting: Specialist

## 2017-10-17 NOTE — Telephone Encounter (Signed)
Pt has no auth for visit on 11/1 or the upcoming appt on 11/15. Please contact patient. I called her and advise as a courtesy I was calling to advise her to contact the [place] for the [place]. She said she has never had to do this.....Marland Kitchensomeone else always called for her. She stated didn't want to argue..I advises her I  never did just advising. Please call her and discuss with her.

## 2017-10-22 ENCOUNTER — Other Ambulatory Visit (INDEPENDENT_AMBULATORY_CARE_PROVIDER_SITE_OTHER): Payer: Self-pay | Admitting: Specialist

## 2017-10-22 NOTE — Telephone Encounter (Signed)
Patient request Rx refill for Morphine.  Also, she would like to be called back by [facility] for another issue she is having. Call back # is [phone]

## 2017-10-27 MED ORDER — MORPHINE SULFATE 15 MG PO TABS: 15.0000 mg | ORAL_TABLET | ORAL | 0 refills | Status: DC | PRN

## 2017-10-27 NOTE — Telephone Encounter (Signed)
Patient called back checking status of medication refill.  Pain is continuing to get worse.  Call back number [phone]

## 2017-10-28 ENCOUNTER — Telehealth (INDEPENDENT_AMBULATORY_CARE_PROVIDER_SITE_OTHER): Payer: Self-pay | Admitting: Radiology

## 2017-10-28 NOTE — Telephone Encounter (Signed)
Pt is aware that her rx is ready for pick up at the front desk 

## 2017-10-28 NOTE — Telephone Encounter (Signed)
Patient called today again to check on the Rx for morphine that she requested last Wed 10/22/17.  It was responded to by Dr Louanne Skye on 10/27/17, Rx was printed but not signed.  Dr Louanne Skye will sign it this AM, and I have advised patient that she can pickup the Rx this morning.  She says her mother will come pickup the Rx.  I apologized for the wait time on this.  She is very distraught, says she is in so much pain, and knows she is referred to pain management, but needs this med in the meantime.

## 2017-10-30 ENCOUNTER — Ambulatory Visit (INDEPENDENT_AMBULATORY_CARE_PROVIDER_SITE_OTHER): Payer: No Typology Code available for payment source | Admitting: Surgery

## 2017-10-30 ENCOUNTER — Other Ambulatory Visit (INDEPENDENT_AMBULATORY_CARE_PROVIDER_SITE_OTHER): Payer: Self-pay | Admitting: Specialist

## 2017-10-30 ENCOUNTER — Other Ambulatory Visit (INDEPENDENT_AMBULATORY_CARE_PROVIDER_SITE_OTHER): Payer: Self-pay | Admitting: Radiology

## 2017-10-30 MED ORDER — MORPHINE SULFATE 15 MG PO TABS: 15.0000 mg | ORAL_TABLET | ORAL | 0 refills | Status: DC | PRN

## 2017-10-30 NOTE — Telephone Encounter (Signed)
Looks like patient will run out on Sunday if she takes 1 every 4 hours.  Please advise.

## 2017-10-30 NOTE — Telephone Encounter (Signed)
Patient called this requesting an RX refill on her Morphine.  CB#[phone].  Thank you.

## 2017-10-30 NOTE — Addendum Note (Signed)
Addended by: Minda Ditto, [name] N on: 10/30/2017 10:06 AM   Modules accepted: Orders

## 2017-10-31 ENCOUNTER — Other Ambulatory Visit (INDEPENDENT_AMBULATORY_CARE_PROVIDER_SITE_OTHER): Payer: Self-pay | Admitting: Radiology

## 2017-10-31 DIAGNOSIS — Z981 Arthrodesis status: Secondary | ICD-10-CM

## 2017-10-31 DIAGNOSIS — M961 Postlaminectomy syndrome, not elsewhere classified: Secondary | ICD-10-CM

## 2017-10-31 DIAGNOSIS — M5416 Radiculopathy, lumbar region: Secondary | ICD-10-CM

## 2017-10-31 DIAGNOSIS — M5136 Other intervertebral disc degeneration, lumbar region: Secondary | ICD-10-CM

## 2017-10-31 NOTE — Telephone Encounter (Signed)
Pt is aware her rx is at the front desk

## 2017-11-05 ENCOUNTER — Ambulatory Visit (INDEPENDENT_AMBULATORY_CARE_PROVIDER_SITE_OTHER): Payer: No Typology Code available for payment source | Admitting: Surgery

## 2017-11-11 ENCOUNTER — Other Ambulatory Visit (INDEPENDENT_AMBULATORY_CARE_PROVIDER_SITE_OTHER): Payer: Self-pay | Admitting: Specialist

## 2017-11-11 MED ORDER — MORPHINE SULFATE 15 MG PO TABS: 15.0000 mg | ORAL_TABLET | ORAL | 0 refills | Status: DC | PRN

## 2017-11-11 NOTE — Telephone Encounter (Signed)
Sent to Dr. Nitka ° ° °

## 2017-11-11 NOTE — Telephone Encounter (Signed)
Ncknight,[name] [name] [birth year]    Med refill Morphine (MSIR) 15 mg tablet

## 2017-11-12 NOTE — Telephone Encounter (Signed)
Pt is aware rx is ready for pick up at the front desk.

## 2017-11-13 ENCOUNTER — Telehealth (INDEPENDENT_AMBULATORY_CARE_PROVIDER_SITE_OTHER): Payer: Self-pay | Admitting: Specialist

## 2017-11-13 ENCOUNTER — Ambulatory Visit (INDEPENDENT_AMBULATORY_CARE_PROVIDER_SITE_OTHER): Payer: No Typology Code available for payment source | Admitting: Surgery

## 2017-11-13 NOTE — Telephone Encounter (Signed)
Received vm from [place] w/ disability claims recovery checking on records request. I called her back [phone] and told her that we do not have their request. I asked she refax and I would watch for it. Will fax records as soon as I receive it.

## 2017-11-18 ENCOUNTER — Other Ambulatory Visit (INDEPENDENT_AMBULATORY_CARE_PROVIDER_SITE_OTHER): Payer: Self-pay | Admitting: Specialist

## 2017-11-18 MED ORDER — MORPHINE SULFATE 15 MG PO TABS: 15.0000 mg | ORAL_TABLET | ORAL | 0 refills | Status: DC | PRN

## 2017-11-18 NOTE — Telephone Encounter (Signed)
Sent to Dr. Nitka ° ° °

## 2017-11-18 NOTE — Telephone Encounter (Signed)
Patient called to request a refill for morphine. Please call patient to advise when it will be ready

## 2017-11-19 NOTE — Telephone Encounter (Signed)
Patient is aware her rx is ready for pick up at the front desk 

## 2017-11-20 ENCOUNTER — Ambulatory Visit (INDEPENDENT_AMBULATORY_CARE_PROVIDER_SITE_OTHER): Payer: No Typology Code available for payment source | Admitting: Surgery

## 2017-11-25 ENCOUNTER — Telehealth (INDEPENDENT_AMBULATORY_CARE_PROVIDER_SITE_OTHER): Payer: Self-pay | Admitting: Specialist

## 2017-11-25 ENCOUNTER — Other Ambulatory Visit (INDEPENDENT_AMBULATORY_CARE_PROVIDER_SITE_OTHER): Payer: Self-pay | Admitting: Specialist

## 2017-11-25 NOTE — Telephone Encounter (Signed)
Patient need an extension for service sent to the [place], to get more visits to see DrNitka.

## 2017-11-25 NOTE — Telephone Encounter (Signed)
Request for additional services faxed in to the [place].

## 2017-11-25 NOTE — Addendum Note (Signed)
Addended by: Minda Ditto, [name] N on: 11/25/2017 02:16 PM   Modules accepted: Orders

## 2017-11-25 NOTE — Telephone Encounter (Signed)
Patient called requesting more morphine and is in extreme pain. Also her right side is swollen and painful and she needed to know if you suggested her make an appointment. CB # [phone]

## 2017-11-27 ENCOUNTER — Telehealth (INDEPENDENT_AMBULATORY_CARE_PROVIDER_SITE_OTHER): Payer: Self-pay | Admitting: Specialist

## 2017-11-27 NOTE — Telephone Encounter (Signed)
Patient called concerning her refill request for Morphine and also getting an injection. Patient advised she is out of medication. The number to con patient is [phone]

## 2017-11-28 NOTE — Telephone Encounter (Signed)
Patient called concerning her refill request for Morphine and also getting an injection. Patient advised she is out of medication. The number to con patient is [phone]

## 2017-12-01 ENCOUNTER — Other Ambulatory Visit (INDEPENDENT_AMBULATORY_CARE_PROVIDER_SITE_OTHER): Payer: Self-pay | Admitting: Specialist

## 2017-12-01 DIAGNOSIS — M961 Postlaminectomy syndrome, not elsewhere classified: Secondary | ICD-10-CM

## 2017-12-01 MED ORDER — MORPHINE SULFATE 15 MG PO TABS: 15.0000 mg | ORAL_TABLET | ORAL | 0 refills | Status: DC | PRN

## 2017-12-01 MED ORDER — PREGABALIN 75 MG PO CAPS: 75.0000 mg | ORAL_CAPSULE | Freq: Two times a day (BID) | ORAL | 6 refills | Status: DC

## 2017-12-02 NOTE — Telephone Encounter (Signed)
IC patient and asked her to call her PCP at [facility] to request these as well,  I cannot reach anyone via phone about this.

## 2017-12-03 ENCOUNTER — Other Ambulatory Visit (INDEPENDENT_AMBULATORY_CARE_PROVIDER_SITE_OTHER): Payer: Self-pay | Admitting: Specialist

## 2017-12-03 NOTE — Telephone Encounter (Signed)
Med refill   Morphine (MSIR) 15 mg tablet

## 2017-12-04 NOTE — Telephone Encounter (Signed)
Can you send to [facility] to advise?

## 2017-12-04 NOTE — Telephone Encounter (Signed)
Sent to Dr. Nitka ° ° °

## 2017-12-05 MED ORDER — MORPHINE SULFATE 15 MG PO TABS: 15.0000 mg | ORAL_TABLET | ORAL | 0 refills | Status: DC | PRN

## 2017-12-05 NOTE — Telephone Encounter (Signed)
Patient is aware rx is ready at the front desk for pick up

## 2017-12-11 ENCOUNTER — Telehealth (INDEPENDENT_AMBULATORY_CARE_PROVIDER_SITE_OTHER): Payer: Self-pay | Admitting: Specialist

## 2017-12-11 NOTE — Telephone Encounter (Signed)
Med refill  Morphine (MSIR) 15 mg tablet

## 2017-12-11 NOTE — Telephone Encounter (Signed)
Please advise if ok to rf?

## 2017-12-12 ENCOUNTER — Other Ambulatory Visit (INDEPENDENT_AMBULATORY_CARE_PROVIDER_SITE_OTHER): Payer: Self-pay | Admitting: Specialist

## 2017-12-12 MED ORDER — MORPHINE SULFATE 15 MG PO TABS: 15.0000 mg | ORAL_TABLET | ORAL | 0 refills | Status: DC | PRN

## 2017-12-12 NOTE — Telephone Encounter (Signed)
I called patient to advise script is at front for pick up. She authorized her brother, Stacy Gardner, to pick up the script.

## 2017-12-12 NOTE — Telephone Encounter (Signed)
Rx for MS 15 mg IR #40 printed, [place] CS web site accessed and reviewed prior to Rx signing. She may pick up today.

## 2017-12-18 ENCOUNTER — Other Ambulatory Visit (INDEPENDENT_AMBULATORY_CARE_PROVIDER_SITE_OTHER): Payer: Self-pay | Admitting: Specialist

## 2017-12-18 MED ORDER — MORPHINE SULFATE 15 MG PO TABS: 15.0000 mg | ORAL_TABLET | ORAL | 0 refills | Status: DC | PRN

## 2017-12-18 NOTE — Telephone Encounter (Signed)
Pt is aware that her rx is ready for pick up at the front desk 

## 2017-12-18 NOTE — Telephone Encounter (Signed)
[place] Controlled Medication Web site reviewed, is receiving Lyrica from [facility] and has decreased from 30mg   To 15 mg MS. No other providers. Renew the weekly MS 15 mg. jen

## 2017-12-18 NOTE — Telephone Encounter (Signed)
Sent rx to [facility]

## 2017-12-18 NOTE — Telephone Encounter (Signed)
Patient called in for refill for Morphine.  Please call patient to advise.

## 2017-12-23 ENCOUNTER — Other Ambulatory Visit (INDEPENDENT_AMBULATORY_CARE_PROVIDER_SITE_OTHER): Payer: Self-pay | Admitting: Specialist

## 2017-12-23 NOTE — Telephone Encounter (Signed)
[name] [name],  I think you were working on this at one time getting her approved from the VA----Can you advise on this?  Pt called and would like to check on her authorization for next shot. Please call pt to discuss authorization.------

## 2017-12-23 NOTE — Telephone Encounter (Signed)
Med refill  Morphine (MSIR)15 mg tablet     Pt called and would like to check on her authorization for next shot. Please call pt to discuss authorization.

## 2017-12-23 NOTE — Telephone Encounter (Signed)
I had spoken to patient and advised her to call her primary care doctor at the [place] to inquire on getting this referral.  I could never get a response from them, and when I called they could not help me.  She would need to call them, which I did tell her.  I would see if she found anything out?  Thanks, sorry I could not help further.

## 2017-12-24 ENCOUNTER — Other Ambulatory Visit (INDEPENDENT_AMBULATORY_CARE_PROVIDER_SITE_OTHER): Payer: Self-pay | Admitting: Specialist

## 2017-12-24 DIAGNOSIS — M5417 Radiculopathy, lumbosacral region: Secondary | ICD-10-CM

## 2017-12-24 MED ORDER — MORPHINE SULFATE 15 MG PO TABS: 15.0000 mg | ORAL_TABLET | ORAL | 0 refills | Status: DC | PRN

## 2017-12-24 NOTE — Progress Notes (Signed)
ref

## 2017-12-24 NOTE — Telephone Encounter (Signed)
I ordered a left S1 caudal Epidural steroid, renew her MS IR. jen

## 2017-12-25 NOTE — Telephone Encounter (Signed)
I called and advised rx is readt for pick up at the front desk

## 2017-12-29 ENCOUNTER — Other Ambulatory Visit (INDEPENDENT_AMBULATORY_CARE_PROVIDER_SITE_OTHER): Payer: Self-pay | Admitting: Specialist

## 2017-12-29 NOTE — Telephone Encounter (Signed)
Patient called to give you the correct number to refax for VA approval/authorization for request of additional services. The correct fax number for [facility] is [phone] and their phone # if you have any questions is [phone] ext [phone].   Patient also requests a refill on Morphine.

## 2017-12-31 MED ORDER — MORPHINE SULFATE 15 MG PO TABS: 15.0000 mg | ORAL_TABLET | ORAL | 0 refills | Status: DC | PRN

## 2017-12-31 NOTE — Telephone Encounter (Signed)
Patient called to give you the correct number to refax for VA approval/authorization for request of additional services. The correct fax number for [facility] is [phone] and their phone # if you have any questions is [phone] ext [phone].----Can we send for auth to this number per her MD

## 2017-12-31 NOTE — Telephone Encounter (Signed)
Patient is aware rx is ready for pick up 

## 2018-01-01 ENCOUNTER — Ambulatory Visit (INDEPENDENT_AMBULATORY_CARE_PROVIDER_SITE_OTHER): Payer: No Typology Code available for payment source | Admitting: Surgery

## 2018-01-01 NOTE — Telephone Encounter (Signed)
I have faxed this with notes to the number listed, will f/u on it.

## 2018-01-07 ENCOUNTER — Other Ambulatory Visit (INDEPENDENT_AMBULATORY_CARE_PROVIDER_SITE_OTHER): Payer: Self-pay | Admitting: Specialist

## 2018-01-07 NOTE — Telephone Encounter (Signed)
Patient called requesting a refill on her morphine. CB # [phone]

## 2018-01-07 NOTE — Addendum Note (Signed)
Addended by: Minda Ditto, Geoffery Spruce on: 01/07/2018 12:33 PM   Modules accepted: Orders

## 2018-01-07 NOTE — Telephone Encounter (Signed)
I have faxed request and last notes from a land line now and they went through.

## 2018-01-09 ENCOUNTER — Encounter (INDEPENDENT_AMBULATORY_CARE_PROVIDER_SITE_OTHER): Payer: No Typology Code available for payment source | Admitting: Physical Medicine and Rehabilitation

## 2018-01-09 ENCOUNTER — Encounter (INDEPENDENT_AMBULATORY_CARE_PROVIDER_SITE_OTHER): Payer: Self-pay

## 2018-01-09 MED ORDER — MORPHINE SULFATE 15 MG PO TABS: 15.0000 mg | ORAL_TABLET | ORAL | 0 refills | Status: DC | PRN

## 2018-01-09 NOTE — Telephone Encounter (Signed)
Pt aware her rx is ready for pick up

## 2018-01-13 NOTE — Telephone Encounter (Signed)
I have told patient when she was here to call her PCP as well, and let us know status on the request for additional services.

## 2018-01-13 NOTE — Telephone Encounter (Signed)
IC patient and LM that I have faxed this request again, to a different fax number again.  I have asked her as well to call [facility] to speak with them about this.

## 2018-01-14 ENCOUNTER — Other Ambulatory Visit (INDEPENDENT_AMBULATORY_CARE_PROVIDER_SITE_OTHER): Payer: Self-pay | Admitting: Specialist

## 2018-01-14 MED ORDER — MORPHINE SULFATE 15 MG PO TABS: 15.0000 mg | ORAL_TABLET | ORAL | 0 refills | Status: DC | PRN

## 2018-01-14 NOTE — Telephone Encounter (Signed)
I called patient and advised that script is ready at the front desk for pick up.

## 2018-01-14 NOTE — Telephone Encounter (Signed)
Patient requests RX refill on Morphine.

## 2018-01-14 NOTE — Telephone Encounter (Signed)
Sent request to Dr. Nitka 

## 2018-01-15 ENCOUNTER — Encounter (INDEPENDENT_AMBULATORY_CARE_PROVIDER_SITE_OTHER): Payer: Self-pay

## 2018-01-15 ENCOUNTER — Telehealth (INDEPENDENT_AMBULATORY_CARE_PROVIDER_SITE_OTHER): Payer: Self-pay | Admitting: Specialist

## 2018-01-15 ENCOUNTER — Ambulatory Visit (INDEPENDENT_AMBULATORY_CARE_PROVIDER_SITE_OTHER): Payer: No Typology Code available for payment source | Admitting: Surgery

## 2018-01-15 NOTE — Telephone Encounter (Signed)
FYI [facility] received a referral for patient and they do not accept patients insurance.

## 2018-01-16 NOTE — Telephone Encounter (Signed)
IC again and LMVM for Misty RN to call me regarding this request for additional services. Her ext is [phone].

## 2018-01-19 NOTE — Telephone Encounter (Signed)
Voice mail from [place] and she says that she has notified the primary care that a new consult is needed with them before they can approved her to come here again.  They are in the process of arranging MRI/Xrays so that the new consult can be done.  Misty ph [phone], ext [phone].  They will fax the new referral/auth to [place] once done.

## 2018-01-21 ENCOUNTER — Other Ambulatory Visit (INDEPENDENT_AMBULATORY_CARE_PROVIDER_SITE_OTHER): Payer: Self-pay | Admitting: Specialist

## 2018-01-21 MED ORDER — MORPHINE SULFATE 15 MG PO TABS: 15.0000 mg | ORAL_TABLET | ORAL | 0 refills | Status: DC | PRN

## 2018-01-21 NOTE — Telephone Encounter (Signed)
Patient left a message requesting an RX refill on her Morphine.  CB#[phone].  Thank you.Marland Kitchen

## 2018-01-22 ENCOUNTER — Other Ambulatory Visit (INDEPENDENT_AMBULATORY_CARE_PROVIDER_SITE_OTHER): Payer: Self-pay | Admitting: Radiology

## 2018-01-22 DIAGNOSIS — G8929 Other chronic pain: Secondary | ICD-10-CM

## 2018-01-22 DIAGNOSIS — M5441 Lumbago with sciatica, right side: Principal | ICD-10-CM

## 2018-01-22 DIAGNOSIS — M5442 Lumbago with sciatica, left side: Principal | ICD-10-CM

## 2018-01-22 NOTE — Telephone Encounter (Signed)
Sent a Referral to [facility] @ [address] --- Pending review

## 2018-01-22 NOTE — Telephone Encounter (Signed)
Pt aware rx is ready for pickup.  

## 2018-01-27 ENCOUNTER — Telehealth (INDEPENDENT_AMBULATORY_CARE_PROVIDER_SITE_OTHER): Payer: Self-pay | Admitting: Specialist

## 2018-01-27 ENCOUNTER — Other Ambulatory Visit (INDEPENDENT_AMBULATORY_CARE_PROVIDER_SITE_OTHER): Payer: Self-pay | Admitting: Specialist

## 2018-01-27 MED ORDER — MORPHINE SULFATE 15 MG PO TABS: 15.0000 mg | ORAL_TABLET | ORAL | 0 refills | Status: DC | PRN

## 2018-01-27 NOTE — Telephone Encounter (Signed)
Info has been faxed to [facility] @ the [place]

## 2018-01-27 NOTE — Telephone Encounter (Signed)
I called and advised patient that her rx is ready for pick up at the front desk.  She also states that she needs an order faxed to [facility] @ the [place] to get her into Pain Management there.

## 2018-01-27 NOTE — Telephone Encounter (Signed)
Per pt [facility]I will fax the info to them as requested.

## 2018-01-27 NOTE — Telephone Encounter (Signed)
Estill Bamberg from [facility] called about the referral to them.  Since the patient has [employer], she needs to have a referral from the [place] or be sent to a [place] facility.  CB#[phone] ext. [phone].  Thank you.

## 2018-01-27 NOTE — Telephone Encounter (Signed)
We are sending her to the [place] for pain management

## 2018-01-27 NOTE — Telephone Encounter (Signed)
Med refill   Morphine   Please give pt a call to discuss pt care

## 2018-01-30 ENCOUNTER — Other Ambulatory Visit (INDEPENDENT_AMBULATORY_CARE_PROVIDER_SITE_OTHER): Payer: Self-pay | Admitting: Specialist

## 2018-01-30 NOTE — Telephone Encounter (Signed)
Patient requests RX refill on Morphine.

## 2018-02-03 MED ORDER — MORPHINE SULFATE 15 MG PO TABS: 15.0000 mg | ORAL_TABLET | ORAL | 0 refills | Status: DC | PRN

## 2018-02-03 MED ORDER — MORPHINE SULFATE 15 MG PO TABS
15.0000 mg | ORAL_TABLET | ORAL | 0 refills | Status: DC | PRN
Start: 2018-02-03 — End: 2018-02-10

## 2018-02-03 NOTE — Addendum Note (Signed)
Addended by: Minda Ditto, Geoffery Spruce on: 02/03/2018 08:34 AM   Modules accepted: Orders

## 2018-02-09 ENCOUNTER — Other Ambulatory Visit (INDEPENDENT_AMBULATORY_CARE_PROVIDER_SITE_OTHER): Payer: Self-pay | Admitting: Specialist

## 2018-02-09 NOTE — Telephone Encounter (Signed)
Patient requesting RX refill for Morphine. # [phone]

## 2018-02-11 MED ORDER — MORPHINE SULFATE 15 MG PO TABS: 15.0000 mg | ORAL_TABLET | ORAL | 0 refills | Status: DC | PRN

## 2018-02-11 NOTE — Telephone Encounter (Signed)
lmom that her rx is ready for pick up at the front desk.

## 2018-02-17 ENCOUNTER — Other Ambulatory Visit (INDEPENDENT_AMBULATORY_CARE_PROVIDER_SITE_OTHER): Payer: Self-pay | Admitting: Specialist

## 2018-02-17 NOTE — Telephone Encounter (Signed)
Sent to Dr. Nitka ° ° °

## 2018-02-17 NOTE — Telephone Encounter (Signed)
Patient called to request refill for Morphine.  Please call patient to advise

## 2018-02-18 ENCOUNTER — Other Ambulatory Visit (INDEPENDENT_AMBULATORY_CARE_PROVIDER_SITE_OTHER): Payer: Self-pay | Admitting: Specialist

## 2018-02-18 ENCOUNTER — Telehealth (INDEPENDENT_AMBULATORY_CARE_PROVIDER_SITE_OTHER): Payer: Self-pay | Admitting: Specialist

## 2018-02-18 MED ORDER — MORPHINE SULFATE 15 MG PO TABS: 15.0000 mg | ORAL_TABLET | ORAL | 0 refills | Status: DC | PRN

## 2018-02-18 NOTE — Telephone Encounter (Signed)
Patient called advised she is out of her medication. The number to contact patient is [phone]

## 2018-02-18 NOTE — Telephone Encounter (Signed)
Patient advised her rx is ready for pick up

## 2018-02-24 ENCOUNTER — Other Ambulatory Visit (INDEPENDENT_AMBULATORY_CARE_PROVIDER_SITE_OTHER): Payer: Self-pay | Admitting: Specialist

## 2018-02-24 MED ORDER — MORPHINE SULFATE 15 MG PO TABS: 15.0000 mg | ORAL_TABLET | ORAL | 0 refills | Status: DC | PRN

## 2018-02-24 NOTE — Telephone Encounter (Signed)
Sent to [facility]  

## 2018-02-24 NOTE — Telephone Encounter (Signed)
Med refill  °Morphine  °

## 2018-02-25 NOTE — Telephone Encounter (Signed)
Patient is aware that her rx is ready for pick up at the front desk

## 2018-03-03 ENCOUNTER — Other Ambulatory Visit (INDEPENDENT_AMBULATORY_CARE_PROVIDER_SITE_OTHER): Payer: Self-pay | Admitting: Specialist

## 2018-03-03 MED ORDER — MORPHINE SULFATE 15 MG PO TABS: 15.0000 mg | ORAL_TABLET | ORAL | 0 refills | Status: DC | PRN

## 2018-03-03 NOTE — Telephone Encounter (Signed)
Sent to Dr. Nitka ° ° °

## 2018-03-03 NOTE — Telephone Encounter (Signed)
Patient called to request refill on Morphine.  Please call patient to advise

## 2018-03-04 NOTE — Telephone Encounter (Signed)
I called and advised patient that her rx was ready for pickup at the front desk.

## 2018-03-11 ENCOUNTER — Other Ambulatory Visit (INDEPENDENT_AMBULATORY_CARE_PROVIDER_SITE_OTHER): Payer: Self-pay | Admitting: Specialist

## 2018-03-11 NOTE — Telephone Encounter (Signed)
Med refill  Morphine (MSIR)15mg  tablet

## 2018-03-13 MED ORDER — MORPHINE SULFATE 15 MG PO TABS: 15.0000 mg | ORAL_TABLET | ORAL | 0 refills | Status: DC | PRN

## 2018-03-13 NOTE — Telephone Encounter (Signed)
Script at front desk. I called patient and advised.

## 2018-03-23 ENCOUNTER — Telehealth (INDEPENDENT_AMBULATORY_CARE_PROVIDER_SITE_OTHER): Payer: Self-pay | Admitting: Specialist

## 2018-03-23 NOTE — Telephone Encounter (Signed)
Med refill   Morphine   Methocarbamol muscle relaxer

## 2018-03-25 MED ORDER — MORPHINE SULFATE 15 MG PO TABS: 15.0000 mg | ORAL_TABLET | ORAL | 0 refills | Status: DC | PRN

## 2018-03-25 NOTE — Telephone Encounter (Signed)
Called and advised pt that her rx was ready for pick up at the front desk

## 2018-03-26 NOTE — Telephone Encounter (Signed)
Patient called and was inquiring about the prescription for her Methocarbamol.  She stated that she checked [facility] and they do not have it.

## 2018-03-27 ENCOUNTER — Telehealth (INDEPENDENT_AMBULATORY_CARE_PROVIDER_SITE_OTHER): Payer: Self-pay | Admitting: Radiology

## 2018-03-27 NOTE — Telephone Encounter (Signed)
Can you please advise for Dr. Nitka? 

## 2018-03-27 NOTE — Telephone Encounter (Signed)
Sorry , robaxin potentiates Morphine . Respiratory depression potential, will have to wait on Nitka response, I have not seen patient.

## 2018-03-27 NOTE — Telephone Encounter (Signed)
Patient is requesting an rx for methocarbamol for her muscle spasms.  This request has been sent multiple times to Dr. Louanne Skye and there has not been any response from him.  Can you please ask Dr. Lorin Mercy about this one?

## 2018-03-27 NOTE — Telephone Encounter (Signed)
I called patient and advised. She states that she is having charlie horses all of the way down her legs and that her toes are curled up on each other and that she cannot take this pain. She states that she will stop the lyrica if she has to. I advised I would send the message back to Dr. Louanne Skye to advise. She understands it will be next week before she hears anything.

## 2018-04-02 ENCOUNTER — Other Ambulatory Visit (INDEPENDENT_AMBULATORY_CARE_PROVIDER_SITE_OTHER): Payer: Self-pay | Admitting: Specialist

## 2018-04-02 MED ORDER — MORPHINE SULFATE 15 MG PO TABS: 15.0000 mg | ORAL_TABLET | ORAL | 0 refills | Status: DC | PRN

## 2018-04-02 NOTE — Telephone Encounter (Signed)
Med refill  °Morphine  °

## 2018-04-06 NOTE — Telephone Encounter (Signed)
Pt is aware her rx is ready for pick up at the front desk 

## 2018-04-14 ENCOUNTER — Other Ambulatory Visit (INDEPENDENT_AMBULATORY_CARE_PROVIDER_SITE_OTHER): Payer: Self-pay | Admitting: Specialist

## 2018-04-14 ENCOUNTER — Telehealth (INDEPENDENT_AMBULATORY_CARE_PROVIDER_SITE_OTHER): Payer: Self-pay | Admitting: Specialist

## 2018-04-14 MED ORDER — MORPHINE SULFATE 15 MG PO TABS: 15.0000 mg | ORAL_TABLET | ORAL | 0 refills | Status: DC | PRN

## 2018-04-14 NOTE — Telephone Encounter (Signed)
Patient called to state she needed refill for Morphine.  Please call patient to advise.

## 2018-04-14 NOTE — Telephone Encounter (Signed)
Can you please fax this for her?

## 2018-04-14 NOTE — Telephone Encounter (Signed)
Patient called asked if she can get her updated medication list faxed over to the [place].  The fax# is [phone]. The number to contact patient is [phone]

## 2018-04-14 NOTE — Telephone Encounter (Signed)
Sent the request to Dr. Nitka 

## 2018-04-15 ENCOUNTER — Other Ambulatory Visit (INDEPENDENT_AMBULATORY_CARE_PROVIDER_SITE_OTHER): Payer: Self-pay | Admitting: Radiology

## 2018-04-15 NOTE — Telephone Encounter (Signed)
Patient is aware her rx is ready for pick up 

## 2018-04-23 ENCOUNTER — Other Ambulatory Visit (INDEPENDENT_AMBULATORY_CARE_PROVIDER_SITE_OTHER): Payer: Self-pay | Admitting: Specialist

## 2018-04-23 MED ORDER — MORPHINE SULFATE 15 MG PO TABS: 15.0000 mg | ORAL_TABLET | ORAL | 0 refills | Status: DC | PRN

## 2018-04-23 NOTE — Telephone Encounter (Signed)
Med list has been faxed, pt is aware that her rx will be ready for pick up tomorrow after 9am

## 2018-04-23 NOTE — Telephone Encounter (Signed)
Patient called requesting an RX refill on her Morphine.  She also wanted to know if you have faxed over her updated medication list to the [place].  CB#[phone].  Thank you.

## 2018-05-05 ENCOUNTER — Other Ambulatory Visit (INDEPENDENT_AMBULATORY_CARE_PROVIDER_SITE_OTHER): Payer: Self-pay | Admitting: Specialist

## 2018-05-05 MED ORDER — MORPHINE SULFATE 15 MG PO TABS: 15.0000 mg | ORAL_TABLET | ORAL | 0 refills | Status: DC | PRN

## 2018-05-05 NOTE — Telephone Encounter (Signed)
Patient called asking for a refill on her morphine. CB # [phone]

## 2018-05-05 NOTE — Telephone Encounter (Signed)
Sent request to Dr. Nitka 

## 2018-05-06 NOTE — Telephone Encounter (Signed)
I called and lmom that her rx was ready for pick up

## 2018-05-14 ENCOUNTER — Other Ambulatory Visit (INDEPENDENT_AMBULATORY_CARE_PROVIDER_SITE_OTHER): Payer: Self-pay | Admitting: Specialist

## 2018-05-14 MED ORDER — MORPHINE SULFATE 15 MG PO TABS: 15.0000 mg | ORAL_TABLET | ORAL | 0 refills | Status: DC | PRN

## 2018-05-14 NOTE — Telephone Encounter (Signed)
Patient called requesting a refill of Morphine.  Please call patient to advise  [phone]

## 2018-05-14 NOTE — Telephone Encounter (Signed)
Sent request to Dr. Nitka 

## 2018-05-15 NOTE — Telephone Encounter (Signed)
I called and LMOM for pt that her rx is ready for pick up at the front desk

## 2018-05-20 ENCOUNTER — Other Ambulatory Visit (INDEPENDENT_AMBULATORY_CARE_PROVIDER_SITE_OTHER): Payer: Self-pay | Admitting: Specialist

## 2018-05-20 NOTE — Telephone Encounter (Signed)
Patient called asking for a refill on her morphine. CB # [phone]

## 2018-05-21 NOTE — Telephone Encounter (Signed)
Sent request to Dr. Nitka 

## 2018-05-21 NOTE — Addendum Note (Signed)
Addended by: Minda Ditto, [name] N on: 05/21/2018 09:30 AM   Modules accepted: Orders

## 2018-05-22 ENCOUNTER — Other Ambulatory Visit (INDEPENDENT_AMBULATORY_CARE_PROVIDER_SITE_OTHER): Payer: Self-pay | Admitting: Specialist

## 2018-05-22 MED ORDER — MORPHINE SULFATE 15 MG PO TABS: 15.0000 mg | ORAL_TABLET | ORAL | 0 refills | Status: DC | PRN

## 2018-05-25 NOTE — Telephone Encounter (Signed)
I called and advised pt that her rx is ready for pick up at the front desk

## 2018-05-27 ENCOUNTER — Telehealth (INDEPENDENT_AMBULATORY_CARE_PROVIDER_SITE_OTHER): Payer: Self-pay | Admitting: Specialist

## 2018-05-27 ENCOUNTER — Other Ambulatory Visit (INDEPENDENT_AMBULATORY_CARE_PROVIDER_SITE_OTHER): Payer: Self-pay | Admitting: Specialist

## 2018-05-27 NOTE — Telephone Encounter (Signed)
Fax med list to Dr. Jake Samples # [phone]  Also, patient requesting refill on morphine Patients # [phone]

## 2018-05-27 NOTE — Telephone Encounter (Signed)
Faxed med list to Dr. Armanda Magic and sent med request to Dr. Louanne Skye

## 2018-05-27 NOTE — Telephone Encounter (Signed)
12/24/2017 OV note faxed to [facility] [phone]

## 2018-05-29 ENCOUNTER — Other Ambulatory Visit (INDEPENDENT_AMBULATORY_CARE_PROVIDER_SITE_OTHER): Payer: Self-pay | Admitting: Specialist

## 2018-05-29 MED ORDER — MORPHINE SULFATE 15 MG PO TABS: 15.0000 mg | ORAL_TABLET | ORAL | 0 refills | Status: DC | PRN

## 2018-06-01 NOTE — Telephone Encounter (Signed)
I called and advised her that her rx was ready for pick up, she also wanted [place] to know that the [place] was supposed to be sending auth for her to see [place] in- business days as of Friday 05/29/18, and they also rec'd medication list so they will be getting her started in the pain management program thru the [place].

## 2018-06-08 ENCOUNTER — Other Ambulatory Visit (INDEPENDENT_AMBULATORY_CARE_PROVIDER_SITE_OTHER): Payer: Self-pay | Admitting: Specialist

## 2018-06-08 MED ORDER — MORPHINE SULFATE 15 MG PO TABS: 15.0000 mg | ORAL_TABLET | ORAL | 0 refills | Status: DC | PRN

## 2018-06-08 NOTE — Telephone Encounter (Signed)
Pt has been advised her rx read y for pick up

## 2018-06-08 NOTE — Telephone Encounter (Signed)
Med refill  Morphine   Please call pt to discuss pt care

## 2018-06-08 NOTE — Telephone Encounter (Signed)
Sent to Dr. Louanne Skye

## 2018-06-15 ENCOUNTER — Other Ambulatory Visit (INDEPENDENT_AMBULATORY_CARE_PROVIDER_SITE_OTHER): Payer: Self-pay | Admitting: Specialist

## 2018-06-15 MED ORDER — MORPHINE SULFATE 15 MG PO TABS: 15.0000 mg | ORAL_TABLET | ORAL | 0 refills | Status: DC | PRN

## 2018-06-15 NOTE — Telephone Encounter (Signed)
Patient requesting refill on morphine. CB # [phone]

## 2018-06-16 ENCOUNTER — Telehealth (INDEPENDENT_AMBULATORY_CARE_PROVIDER_SITE_OTHER): Payer: Self-pay | Admitting: Specialist

## 2018-06-16 NOTE — Telephone Encounter (Signed)
Pt is aware that her rx is ready for pick up at the front desk, and that she has to be seen before he will refill meds again, she states that she has been approved for pain management but they have not called her to get her appt and they are also supposed to be sending over auth for [place] to see her.  She is going to call them and have to them refax auth for OVs

## 2018-06-16 NOTE — Telephone Encounter (Signed)
Patient returned call asked for a call back as soon as possible. The number to contact patient is [phone]

## 2018-06-16 NOTE — Telephone Encounter (Signed)
I called and lmom for pt to call me back 

## 2018-06-17 NOTE — Telephone Encounter (Signed)
Pt called back, states that Pain management will not see her due to her having a surgical problem.  She states that the [place] is going to have auth to our office by Friday, I did advise that we would get her in as soon as we can.

## 2018-06-22 ENCOUNTER — Other Ambulatory Visit (INDEPENDENT_AMBULATORY_CARE_PROVIDER_SITE_OTHER): Payer: Self-pay | Admitting: Specialist

## 2018-06-22 MED ORDER — MORPHINE SULFATE 15 MG PO TABS
15.0000 mg | ORAL_TABLET | ORAL | 0 refills | Status: DC | PRN
Start: 2018-06-22 — End: 2018-06-29

## 2018-06-22 NOTE — Telephone Encounter (Signed)
Sent request to Dr. Nitka 

## 2018-06-22 NOTE — Telephone Encounter (Signed)
I called and advised her that her rx was ready for pick up

## 2018-06-22 NOTE — Telephone Encounter (Signed)
Patient requesting rx refill on morphine. Patients # [phone]

## 2018-06-29 ENCOUNTER — Other Ambulatory Visit (INDEPENDENT_AMBULATORY_CARE_PROVIDER_SITE_OTHER): Payer: Self-pay | Admitting: Specialist

## 2018-06-29 MED ORDER — MORPHINE SULFATE 15 MG PO TABS: 15.0000 mg | ORAL_TABLET | ORAL | 0 refills | Status: DC | PRN

## 2018-06-29 NOTE — Telephone Encounter (Signed)
Patient called needing Rx refilled (Morphine) The number to contact patient is [phone] °

## 2018-06-29 NOTE — Telephone Encounter (Signed)
Sent request to Dr. Nitka 

## 2018-06-30 NOTE — Telephone Encounter (Signed)
Pt is aware that her rx is ready for pick up at the front desk

## 2018-07-02 ENCOUNTER — Encounter (INDEPENDENT_AMBULATORY_CARE_PROVIDER_SITE_OTHER): Payer: Self-pay | Admitting: Specialist

## 2018-07-02 ENCOUNTER — Ambulatory Visit (INDEPENDENT_AMBULATORY_CARE_PROVIDER_SITE_OTHER): Payer: PRIVATE HEALTH INSURANCE

## 2018-07-02 ENCOUNTER — Ambulatory Visit (INDEPENDENT_AMBULATORY_CARE_PROVIDER_SITE_OTHER): Payer: PRIVATE HEALTH INSURANCE | Admitting: Specialist

## 2018-07-02 VITALS — BP 113/78 | HR 87 | Ht 67.0 in | Wt 170.0 lb

## 2018-07-02 DIAGNOSIS — M4807 Spinal stenosis, lumbosacral region: Secondary | ICD-10-CM | POA: Diagnosis not present

## 2018-07-02 DIAGNOSIS — M5442 Lumbago with sciatica, left side: Secondary | ICD-10-CM

## 2018-07-02 DIAGNOSIS — M5116 Intervertebral disc disorders with radiculopathy, lumbar region: Secondary | ICD-10-CM

## 2018-07-02 DIAGNOSIS — G8929 Other chronic pain: Secondary | ICD-10-CM

## 2018-07-02 DIAGNOSIS — Z981 Arthrodesis status: Secondary | ICD-10-CM

## 2018-07-02 DIAGNOSIS — M5126 Other intervertebral disc displacement, lumbar region: Secondary | ICD-10-CM

## 2018-07-02 DIAGNOSIS — M5441 Lumbago with sciatica, right side: Secondary | ICD-10-CM

## 2018-07-02 NOTE — Progress Notes (Addendum)
Office Visit Note   Patient: [name] [name]           Date of Birth: [birth year]           MRN: [id number] Visit Date: 07/02/2018              Requested by: Buzzy Han, MD [id number] , [place] [address] PCP: Buzzy Han, MD   Assessment & Plan: Visit Diagnoses:  1. Chronic bilateral low back pain with bilateral sciatica   2. Spinal stenosis of lumbosacral region   3. Herniation of lumbar intervertebral disc with radiculopathy   4. Status post lumbar and lumbosacral fusion by anterior technique   5. Recurrent herniation of lumbar disc    47 year old female with chronic back and left leg radicular pain SI distribution. Conjoint nerve root has made previous left microdiscectomies difficult with inadequate visualization and abandoning of the surgery after attemps at discectomy did not show significant herniation. Now with 02/2018 MRI showing persistent HNP left paramedian and subarticular L5-S1 dispite anterior lumbar  Fusion and discectomy. I believe even the ALIF is not able to successfully acccomplish visual disc herniation removal. I recommend reexplore the disc with left L5-S1 facectectomy and left S1 partial or complete pedicle takedown to be able to perform an adequate disc evaluation and avoid the conjoint nerve and further nerve root irritation.  Discussed with [name] and I noted  My concerns about the conjoined root and  Quoted a 50/50 chance of improvement in left leg pain.  Plan: Avoid bending, stooping and avoid lifting weights greater than 10 lbs. Avoid prolong standing and walking. Order for a new walker with wheels. Surgery scheduling secretary Kandice Hams, will call you in the next week to schedule for surgery.  Surgery recommended is a one level redo left L5-S1 disc exploration with left facetectomy and pediculectomy for exploring the left L5-S1 disc for disc herniation this Take morphine for for  pain. Risk of surgery includes risk of infection 1 in 300 patients, bleeding 1/2% chance you would need a transfusion.   Risk to the nerves is one in 10,000. Expect improved walking and standing tolerance. Expect relief of leg pain but numbness may persist depending on the length and degree of pressure that has been present.  Follow-Up Instructions: Return in about 1 month (around 07/30/2018).   Orders:  Orders Placed This Encounter  Procedures  . XR Lumbar Spine 2-3 Views   No orders of the defined types were placed in this encounter.     Procedures: No procedures performed   Clinical Data: Findings:  MRI report 02/17/2018 L1-2, L2-3 normal, mild disc bulge and minimal facet changes L3-4, Mild disc bulge and facet arthrosis L4-5 no stenosis L3-4 and L4-5. L5-S1 with small to moderate sized left paracentral and subarticular disc herniation contacting the left S1 and S2 nerve roots, moderate left perineural enhancement L5 and epidural no nerve root clumping.     Subjective: Chief Complaint  Patient presents with  . 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 First microdiscectomy was 11/2013 and the second was 12/30/2014 and then underwent an Anterior lumbar fusion 08/26/2016. Pain has been consistent left Leg S1 distribution with left posterior buttock numbness and paresthesias. Similar paresthesias into the left post thigh  and lateral left 2 toes. At the last surgery found to have  Cartilage end plate within the spinal canal. Since then she has persistent back left buttock to left posterolateral foot pain,numbness and paresthesias. She has been evaluated by  [facility] and has undergone another MRI with and without contrast. The results are again suspicious for a left L5-S1 recurrent HNP with left subarticular extension.  Plain radiograph  Evaluation shows no motion at the L5-S1 level and an apparent solid fusion.  She remains on morphine IR 15 mg 4-5 times per day with no recent change in her medicaiton use. She has worsening pain with flexion, extension or prolong sitting. She has seen Dr. Jenetta Downer at the [facility] and with the recent review of her MRI she has again a recurrent HNP left L5-S1 She returns today to discuss the results of the MRI and to evaluate for use of narcotics long term. She is taking the morphine as discribed, She has no want to have a second opinion. I have discussed the MRI and she does not want other surgery at another facility.    Review of Systems  Constitutional: Negative.   HENT: Negative.   Eyes: Negative.   Respiratory: Negative.   Cardiovascular: Negative.   Gastrointestinal: Negative.   Endocrine: Negative.   Genitourinary: Negative.   Musculoskeletal: Negative.   Skin: Negative.   Allergic/Immunologic: Negative.   Neurological: Negative.   Hematological: Negative.   Psychiatric/Behavioral: Negative.      Objective: Vital Signs: BP 113/78 (BP Location: Left Arm, Patient Position: Sitting)   Pulse 87   Ht 5\' 7"  (1.702 m)   Wt 170 lb (77.1 kg)   BMI 26.63 kg/m   Physical Exam  Constitutional: She is oriented to person, place, and time. She appears well-developed and well-nourished.  HENT:  Head: Normocephalic and atraumatic.  Eyes: Pupils are equal, round, and reactive to light. EOM are normal.  Neck: Normal range of motion. Neck supple.  Pulmonary/Chest: Effort normal and breath sounds normal.  Abdominal: Soft. Bowel sounds are normal.  Neurological: She is alert and oriented to person, place, and time.  Skin: Skin is warm and dry.  Psychiatric: She has a normal mood and affect. Her behavior is normal. Judgment and thought content normal.    Right Ankle Exam   Muscle Strength  Dorsiflexion:  5/5 Plantar flexion:  5/5   Left Ankle Exam   Range of Motion   Dorsiflexion: abnormal  Plantar flexion: abnormal   Muscle Strength  Dorsiflexion:  4/5  Plantar flexion:  4/5    Back Exam   Tenderness  The patient is experiencing tenderness in the lumbar.  Range of Motion  Extension: abnormal  Flexion: abnormal  Lateral bend right: abnormal  Lateral bend left: abnormal  Rotation right: abnormal  Rotation left: abnormal   Muscle Strength  Right Quadriceps:  5/5  Left Quadriceps:  5/5  Right Hamstrings:  5/5  Left Hamstrings:  5/5   Tests  Straight leg raise right: negative Straight leg raise left: positive  Reflexes  Patellar: normal Achilles: 0/4 Babinski's sign: normal   Other  Toe walk: abnormal Heel walk: abnormal Erythema: no back redness Scars: present  Comments:  Left S1 numbness buttock left posterior thigh. Left foot plantar flexion is 3/5, left foot dorsiflexion is 3/5. Anesthesia left lateral 2 toes.      Specialty Comments:  No specialty comments available.  Imaging: Xr Lumbar Spine 2-3 Views  Result Date: 07/02/2018 L5-S1 fusion with anterior plate and  screws and cage, fusion appears solid.     PMFS History: Patient Active Problem List   Diagnosis Date Noted  . S/P lumbar and lumbosacral fusion by anterior technique 10/09/2016    Priority: High    Class: Status post  . Disc disease, degenerative, lumbar or lumbosacral 08/26/2016    Priority: High    Class: Chronic  . Herniated nucleus pulposus, lumbar 12/10/2013    Priority: High  . Right lower quadrant pain 06/06/2014  . S/P hysterectomy 02/09/2013  . Pulmonary embolism ([place])   . Protein C deficiency ([place])   . Fibroids   . Endometriosis    Past Medical History:  Diagnosis Date  . Chronic back pain    stenosis and HNP  . History of pulmonary embolus (PE) 03/2008  . Pneumonia    "walking" in the 90's  . Urinary urgency   . Weakness    numbness and tingling on left side    History reviewed. No pertinent family history.  Past  Surgical History:  Procedure Laterality Date  . ABDOMINAL EXPOSURE N/A 08/26/2016   Procedure: ABDOMINAL EXPOSURE;  Surgeon: Rosetta Posner, MD;  Location: [place];  Service: Vascular;  Laterality: N/A;  . ABDOMINAL HYSTERECTOMY    . ANTERIOR LUMBAR FUSION N/A 08/26/2016   Procedure: ANTERIOR LUMBAR FUSION L5-S1, EXCISION OF HERNIATED DISC LEFT L5-S1, ZERO P LUMBAR INTERBODY IMPLANT, LEFT ILIAC CREST BONE GRAFT ;  Surgeon: Jessy Oto, MD;  Location: [facility];  Service: Orthopedics;  Laterality: N/A;  . BACK SURGERY    . DIAGNOSTIC LAPAROSCOPY  2004   x3  . DILATION AND EVACUATION N/A 01/28/2013   Procedure: DILATATION AND EVACUATION;  Surgeon: Princess Bruins, MD;  Location: [facility];  Service: Gynecology;  Laterality: N/A;  . KNEE ARTHROSCOPY Left    x 3  . LUMBAR LAMINECTOMY N/A 12/10/2013   Procedure: LEFT L5-S1 MICRODISCECTOMY USING MIS;  Surgeon: Jessy Oto, MD;  Location: [facility];  Service: Orthopedics;  Laterality: N/A;  . LUMBAR LAMINECTOMY N/A 12/30/2014   Procedure: LEFT L5-S1 Redo MICRODISCECTOMY;  Surgeon: Jessy Oto, MD;  Location: [facility];  Service: Orthopedics;  Laterality: N/A;  . ROBOTIC ASSISTED TOTAL HYSTERECTOMY N/A 02/09/2013   Procedure: ROBOTIC ASSISTED TOTAL HYSTERECTOMY;  Surgeon: Princess Bruins, MD;  Location: [facility];  Service: Gynecology;  Laterality: N/A;  . UNILATERAL SALPINGECTOMY Right 02/09/2013   Procedure: UNILATERAL SALPINGECTOMY;  Surgeon: Princess Bruins, MD;  Location: [facility];  Service: Gynecology;  Laterality: Right;  . WISDOM TOOTH EXTRACTION     Social History   Occupational History  . Not on file  Tobacco Use  . Smoking status: Current Every Day Smoker    Packs/day: 0.50    Years: 25.00    Pack years: 12.50    Types: Cigarettes  . Smokeless tobacco: Never Used  Substance and Sexual Activity  . Alcohol use: Yes  . Drug use: No  . Sexual activity: Not Currently    Birth control/protection: Surgical

## 2018-07-02 NOTE — Patient Instructions (Addendum)
Avoid bending, stooping and avoid lifting weights greater than 10 lbs. Avoid prolong standing and walking. Order for a new walker with wheels. Surgery scheduling secretary Kandice Hams, will call you in the next week to schedule for surgery.  Surgery recommended is a one level redo left L5-S1 disc exploration with left facetectomy and pediculectomy for exploring the left L5-S1 disc for disc herniation this Take morphine for for pain. Risk of surgery includes risk of infection 1 in 300 patients, bleeding 1/2% chance you would need a transfusion.   Risk to the nerves is one in 10,000. I have quoted her a 50/50 chance of left leg pain improvement. Due to the conjoint nerve there has not been a definite evaluationof the left posterior disc and with the current anterior lumbar fusion a complete left L5-S facetectomy and partial S1 pediclectomy can allow for evaluation of the left disc.  Expect improved walking and standing tolerance. Expect relief of leg pain but numbness may persist depending on the length and degree of pressure that has been present.

## 2018-07-06 ENCOUNTER — Other Ambulatory Visit (INDEPENDENT_AMBULATORY_CARE_PROVIDER_SITE_OTHER): Payer: Self-pay | Admitting: Specialist

## 2018-07-06 MED ORDER — MORPHINE SULFATE 15 MG PO TABS: 15.0000 mg | ORAL_TABLET | ORAL | 0 refills | Status: DC | PRN

## 2018-07-06 NOTE — Telephone Encounter (Signed)
Sent request to Dr. Nitka 

## 2018-07-06 NOTE — Telephone Encounter (Signed)
Pt is aware her rx is ready for pick up 

## 2018-07-06 NOTE — Telephone Encounter (Signed)
Med refill  Morphine

## 2018-07-10 ENCOUNTER — Other Ambulatory Visit (INDEPENDENT_AMBULATORY_CARE_PROVIDER_SITE_OTHER): Payer: Self-pay | Admitting: Specialist

## 2018-07-10 MED ORDER — MORPHINE SULFATE 15 MG PO TABS: 15.0000 mg | ORAL_TABLET | ORAL | 0 refills | Status: DC | PRN

## 2018-07-10 NOTE — Telephone Encounter (Signed)
Patient states she will be out of town from 8/2-8/12 on a cruise for 10 days  She states that she will not have enough meds to last her for this amount of time if she is getting her meds on a 7 day basis.  She wants to know if there is anything that we can do for her during this time.

## 2018-07-10 NOTE — Telephone Encounter (Signed)
Patient called to request an RX refill on her Morphine and would also like a return phone call from you.  CB#[phone].  Thank you.

## 2018-07-15 ENCOUNTER — Other Ambulatory Visit (INDEPENDENT_AMBULATORY_CARE_PROVIDER_SITE_OTHER): Payer: Self-pay | Admitting: Specialist

## 2018-07-15 NOTE — Telephone Encounter (Signed)
Pt picked up rx

## 2018-07-15 NOTE — Telephone Encounter (Signed)
Patient called needing Rx refilled (Morphine) Patient asked for [name] to call her concerning her Rx. The number to contact patient is [phone]

## 2018-07-16 MED ORDER — MORPHINE SULFATE 15 MG PO TABS: 15.0000 mg | ORAL_TABLET | ORAL | 0 refills | Status: DC | PRN

## 2018-07-16 NOTE — Telephone Encounter (Signed)
Patient left message requesting a urgent call from [facility]

## 2018-07-17 NOTE — Telephone Encounter (Signed)
Pt is aware her rx is ready for pick up 

## 2018-07-24 ENCOUNTER — Other Ambulatory Visit (INDEPENDENT_AMBULATORY_CARE_PROVIDER_SITE_OTHER): Payer: Self-pay | Admitting: Specialist

## 2018-07-24 NOTE — Telephone Encounter (Signed)
Patient left a voicemail requesting refill on morphine and also wanted to know if her surgery has been scheduled yet. Patients # [phone]

## 2018-07-27 NOTE — Telephone Encounter (Signed)
Can you advise her on her surgery info?

## 2018-07-28 ENCOUNTER — Telehealth (INDEPENDENT_AMBULATORY_CARE_PROVIDER_SITE_OTHER): Payer: Self-pay | Admitting: Specialist

## 2018-07-28 ENCOUNTER — Encounter (INDEPENDENT_AMBULATORY_CARE_PROVIDER_SITE_OTHER): Payer: Self-pay | Admitting: Specialist

## 2018-07-28 MED ORDER — MORPHINE SULFATE 15 MG PO TABS: 15.0000 mg | ORAL_TABLET | ORAL | 0 refills | Status: DC | PRN

## 2018-07-28 NOTE — Telephone Encounter (Signed)
Dr. Louanne Skye has a message for this request in his box

## 2018-07-28 NOTE — Telephone Encounter (Signed)
I called and LMOM for pt that her rx is ready for pick up at the front desk, also advised that Dr. Louanne Skye had her blue sheet with him and that we do have it now so we can get clearance from PCP for surgery

## 2018-07-28 NOTE — Telephone Encounter (Signed)
Patient called asked for a call back concerning Rx refill (Morphine) The number to contact patient is [phone]

## 2018-08-03 ENCOUNTER — Other Ambulatory Visit (INDEPENDENT_AMBULATORY_CARE_PROVIDER_SITE_OTHER): Payer: Self-pay | Admitting: Specialist

## 2018-08-03 MED ORDER — MORPHINE SULFATE 15 MG PO TABS: 15.0000 mg | ORAL_TABLET | ORAL | 0 refills | Status: DC | PRN

## 2018-08-03 NOTE — Telephone Encounter (Signed)
Sent request to Dr. Nitka 

## 2018-08-03 NOTE — Telephone Encounter (Signed)
Patient called this morning requesting an RX refill on her Morphine.  CB#[phone].  Thank you.

## 2018-08-03 NOTE — Addendum Note (Signed)
Addended by: Minda Ditto, [name] N on: 08/03/2018 11:25 AM   Modules accepted: Orders

## 2018-08-04 NOTE — Telephone Encounter (Signed)
Patient is aware her rx is ready for pick up at the front desk 

## 2018-08-11 ENCOUNTER — Encounter (HOSPITAL_COMMUNITY): Payer: Self-pay

## 2018-08-11 ENCOUNTER — Other Ambulatory Visit (INDEPENDENT_AMBULATORY_CARE_PROVIDER_SITE_OTHER): Payer: Self-pay | Admitting: Specialist

## 2018-08-11 ENCOUNTER — Encounter (HOSPITAL_COMMUNITY)
Admission: RE | Admit: 2018-08-11 | Discharge: 2018-08-11 | Disposition: A | Discharge status: Home / Self Care | Payer: No Typology Code available for payment source | Source: Ambulatory Visit | Attending: Specialist | Admitting: Specialist

## 2018-08-11 ENCOUNTER — Other Ambulatory Visit: Payer: Self-pay

## 2018-08-11 DIAGNOSIS — M4807 Spinal stenosis, lumbosacral region: Secondary | ICD-10-CM

## 2018-08-11 DIAGNOSIS — Z981 Arthrodesis status: Secondary | ICD-10-CM | POA: Diagnosis not present

## 2018-08-11 DIAGNOSIS — F17210 Nicotine dependence, cigarettes, uncomplicated: Secondary | ICD-10-CM | POA: Diagnosis not present

## 2018-08-11 DIAGNOSIS — Z86711 Personal history of pulmonary embolism: Secondary | ICD-10-CM | POA: Diagnosis not present

## 2018-08-11 DIAGNOSIS — D6859 Other primary thrombophilia: Secondary | ICD-10-CM | POA: Diagnosis not present

## 2018-08-11 DIAGNOSIS — Z79899 Other long term (current) drug therapy: Secondary | ICD-10-CM | POA: Diagnosis not present

## 2018-08-11 DIAGNOSIS — Z01818 Encounter for other preprocedural examination: Secondary | ICD-10-CM | POA: Insufficient documentation

## 2018-08-11 DIAGNOSIS — M5117 Intervertebral disc disorders with radiculopathy, lumbosacral region: Secondary | ICD-10-CM | POA: Diagnosis present

## 2018-08-11 HISTORY — DX: Unspecified osteoarthritis, unspecified site: M19.90

## 2018-08-11 LAB — CBC
HCT: 45.3 % (ref 36.0–46.0)
Hemoglobin: 15.2 g/dL — ABNORMAL HIGH (ref 12.0–15.0)
MCH: 31.4 pg (ref 26.0–34.0)
MCHC: 33.6 g/dL (ref 30.0–36.0)
MCV: 93.6 fL (ref 78.0–100.0)
PLATELETS: 314 10*3/uL (ref 150–400)
RBC: 4.84 MIL/uL (ref 3.87–5.11)
RDW: 12.3 % (ref 11.5–15.5)
WBC: 6.3 10*3/uL (ref 4.0–10.5)

## 2018-08-11 LAB — SURGICAL PCR SCREEN
MRSA, PCR: NEGATIVE
Staphylococcus aureus: NEGATIVE

## 2018-08-11 NOTE — Telephone Encounter (Signed)
Sent request to Dr. Nitka 

## 2018-08-11 NOTE — Progress Notes (Addendum)
PCP: Buzzy Han, MD  Cardiologist: pt denies  EKG: pt denies past year  Stress test: pt denies   ECHO: pt denies  Cardiac Cath: pt denies  Chest x-ray: pt denies past year, no recent respiratory infections/complications

## 2018-08-11 NOTE — Pre-Procedure Instructions (Signed)
[name] [name]  08/11/2018      [id number] [address], [place] [address] Phone: [phone] Fax: [phone]    Your procedure is scheduled on August 14, 2018.  Report to [facility] Admitting at 1030 AM.  Call this number if you have problems the morning of surgery:  [phone]   Remember:  Do not eat or drink after midnight.    Take these medicines the morning of surgery with A SIP OF WATER  Morphine 15 mg-if needed pregabalin (lyrica)  Beginning now, STOP taking any Aspirin (unless otherwise instructed by your surgeon), Aleve, Naproxen, Ibuprofen, Motrin, Advil, Goody's, BC's, all herbal medications, fish oil, and all vitamins   Contacts, dentures or bridgework may not be worn into surgery.  Leave your suitcase in the car.  After surgery it may be brought to your room.  For patients admitted to the hospital, discharge time will be determined by your treatment team.  Patients discharged the day of surgery will not be allowed to drive home.    [facility]- Preparing For Surgery  Before surgery, you can play an important role. Because skin is not sterile, your skin needs to be as free of germs as possible. You can reduce the number of germs on your skin by washing with CHG (chlorahexidine gluconate) Soap before surgery.  CHG is an antiseptic cleaner which kills germs and bonds with the skin to continue killing germs even after washing.    Oral Hygiene is also important to reduce your risk of infection.  Remember - BRUSH YOUR TEETH THE MORNING OF SURGERY WITH YOUR REGULAR TOOTHPASTE  Please do not use if you have an allergy to CHG or antibacterial soaps. If your skin becomes reddened/irritated stop using the CHG.  Do not shave (including legs and underarms) for at least 48 hours prior to first CHG shower. It is OK to shave your face.  Please follow these  instructions carefully.   1. Shower the NIGHT BEFORE SURGERY and the MORNING OF SURGERY with CHG.   2. If you chose to wash your hair, wash your hair first as usual with your normal shampoo.  3. After you shampoo, rinse your hair and body thoroughly to remove the shampoo.  4. Use CHG as you would any other liquid soap. You can apply CHG directly to the skin and wash gently with a scrungie or a clean washcloth.   5. Apply the CHG Soap to your body ONLY FROM THE NECK DOWN.  Do not use on open wounds or open sores. Avoid contact with your eyes, ears, mouth and genitals (private parts). Wash Face and genitals (private parts)  with your normal soap.  6. Wash thoroughly, paying special attention to the area where your surgery will be performed.  7. Thoroughly rinse your body with warm water from the neck down.  8. DO NOT shower/wash with your normal soap after using and rinsing off the CHG Soap.  9. Pat yourself dry with a CLEAN TOWEL.  10. Wear CLEAN PAJAMAS to bed the night before surgery, wear comfortable clothes the morning of surgery  11. Place CLEAN SHEETS on your bed the night of your first shower and DO NOT SLEEP WITH PETS.  Day of Surgery:  Do not apply any deodorants/lotions.  Please wear clean clothes to the hospital/surgery center.   Remember to brush your teeth WITH YOUR REGULAR TOOTHPASTE.  Do not wear jewelry, make-up or nail polish.  Do not wear lotions, powders, or perfumes, or deodorant.  Do not shave 48 hours prior to surgery.    Do not bring valuables to the hospital.   [facility] is not responsible for any belongings or valuables.  Please read over the following fact sheets that you were given.

## 2018-08-11 NOTE — Telephone Encounter (Signed)
Patient requesting rx refill on morphine. # [phone]

## 2018-08-12 ENCOUNTER — Ambulatory Visit (INDEPENDENT_AMBULATORY_CARE_PROVIDER_SITE_OTHER): Payer: PRIVATE HEALTH INSURANCE | Admitting: Surgery

## 2018-08-12 ENCOUNTER — Encounter (INDEPENDENT_AMBULATORY_CARE_PROVIDER_SITE_OTHER): Payer: Self-pay | Admitting: Surgery

## 2018-08-12 VITALS — Ht 67.0 in | Wt 175.0 lb

## 2018-08-12 DIAGNOSIS — M4807 Spinal stenosis, lumbosacral region: Secondary | ICD-10-CM | POA: Diagnosis not present

## 2018-08-12 MED ORDER — MORPHINE SULFATE 15 MG PO TABS: 15.0000 mg | ORAL_TABLET | ORAL | 0 refills | Status: DC | PRN

## 2018-08-12 NOTE — Progress Notes (Signed)
Patient with history of left L5-S1 stenosis, back pain and left lower extremity radiculopathy presents for preoperative evaluation.  Symptoms unchanged from previous visit with Dr. Louanne Skye.  Today full history and physical performed.  Patient has a history of protein C deficiency with previous DVT and pulmonary embolism in 2009.  Followed by hematologist Dr. Roslyn Smiling with the [facility].  Patient states that she usually gets Lovenox postop and hematologist recommended that again.

## 2018-08-12 NOTE — H&P (Signed)
[name] [name] is an 47 y.o. female.   Chief Complaint: Low back pain with left lower extremity radiculopathy and weakness HPI: Patient with history of left L5-S1 stenosis and the above complaint presents for preop evaluation today.  Chronic symptoms that have failed conservative treatment.  Past Medical History:  Diagnosis Date  . Arthritis   . Chronic back pain    stenosis and HNP  . History of pulmonary embolus (PE) 03/2008  . Pneumonia    "walking" in the 90's  . Urinary urgency   . Weakness    numbness and tingling on left side    Past Surgical History:  Procedure Laterality Date  . ABDOMINAL EXPOSURE N/A 08/26/2016   Procedure: ABDOMINAL EXPOSURE;  Surgeon: Rosetta Posner, MD;  Location: [facility];  Service: Vascular;  Laterality: N/A;  . ABDOMINAL HYSTERECTOMY    . ANTERIOR LUMBAR FUSION N/A 08/26/2016   Procedure: ANTERIOR LUMBAR FUSION L5-S1, EXCISION OF HERNIATED DISC LEFT L5-S1, ZERO P LUMBAR INTERBODY IMPLANT, LEFT ILIAC CREST BONE GRAFT ;  Surgeon: Jessy Oto, MD;  Location: [facility];  Service: Orthopedics;  Laterality: N/A;  . BACK SURGERY    . DIAGNOSTIC LAPAROSCOPY  2004   x3  . DILATION AND EVACUATION N/A 01/28/2013   Procedure: DILATATION AND EVACUATION;  Surgeon: Princess Bruins, MD;  Location: [facility];  Service: Gynecology;  Laterality: N/A;  . KNEE ARTHROSCOPY Left    x 3  . LUMBAR LAMINECTOMY N/A 12/10/2013   Procedure: LEFT L5-S1 MICRODISCECTOMY USING MIS;  Surgeon: Jessy Oto, MD;  Location: [facility];  Service: Orthopedics;  Laterality: N/A;  . LUMBAR LAMINECTOMY N/A 12/30/2014   Procedure: LEFT L5-S1 [place] MICRODISCECTOMY;  Surgeon: Jessy Oto, MD;  Location: [facility];  Service: Orthopedics;  Laterality: N/A;  . ROBOTIC ASSISTED TOTAL HYSTERECTOMY N/A 02/09/2013   Procedure: ROBOTIC ASSISTED TOTAL HYSTERECTOMY;  Surgeon: Princess Bruins, MD;  Location: [facility];  Service: Gynecology;  Laterality: N/A;  . UNILATERAL SALPINGECTOMY Right 02/09/2013   Procedure:  UNILATERAL SALPINGECTOMY;  Surgeon: Princess Bruins, MD;  Location: [facility];  Service: Gynecology;  Laterality: Right;  . WISDOM TOOTH EXTRACTION      No family history on file. Social History:  reports that she has been smoking cigarettes. She has a 12.50 pack-year smoking history. She has never used smokeless tobacco. She reports that she drinks alcohol. She reports that she does not use drugs.  Allergies:  Allergies  Allergen Reactions  . Hydrocodone-Acetaminophen Nausea Only    No medications prior to admission.    Results for orders placed or performed during the hospital encounter of 08/11/18 (from the past 48 hour(s))  CBC     Status: Abnormal   Collection Time: 08/11/18  2:28 PM  Result Value Ref Range   WBC 6.3 4.0 - 10.5 K/uL   RBC 4.84 3.87 - 5.11 MIL/uL   Hemoglobin 15.2 (H) 12.0 - 15.0 g/dL   HCT 45.3 36.0 - 46.0 %   MCV 93.6 78.0 - 100.0 fL   MCH 31.4 26.0 - 34.0 pg   MCHC 33.6 30.0 - 36.0 g/dL   RDW 12.3 11.5 - 15.5 %   Platelets 314 150 - 400 K/uL    Comment: Performed at [facility] [address], [place], [place] [address]  Surgical pcr screen     Status: None   Collection Time: 08/11/18  2:28 PM  Result Value Ref Range   MRSA, PCR NEGATIVE NEGATIVE   Staphylococcus aureus NEGATIVE  NEGATIVE    Comment: (NOTE) The Xpert SA Assay (FDA approved for NASAL specimens in patients 68 years of age and older), is one component of a comprehensive surveillance program. It is not intended to diagnose infection nor to guide or monitor treatment. Performed at [facility], [place] [address] [place],  [address]    No results found.  Review of Systems  Constitutional: Negative.   HENT: Negative.   Eyes: Negative.   Respiratory: Negative.   Cardiovascular: Negative.   Gastrointestinal: Negative.   Musculoskeletal: Positive for back pain.  Skin: Negative.   Neurological: Positive for tingling and focal weakness.  Psychiatric/Behavioral:  Negative.     There were no vitals taken for this visit. Physical Exam  Constitutional: She is oriented to person, place, and time. No distress.  HENT:  Head: Normocephalic.  Eyes: Pupils are equal, round, and reactive to light. EOM are normal.  Neck: Normal range of motion.  Cardiovascular: Normal rate and normal heart sounds.  Respiratory: Effort normal and breath sounds normal. No respiratory distress.  GI: Bowel sounds are normal. She exhibits no distension. There is no tenderness.  Musculoskeletal:  Gait antalgic with a cane.  Positive left straight leg raise.  Left leg weakness throughout with resistance.  Left quad atrophy.  Neurological: She is alert and oriented to person, place, and time.  Skin: Skin is warm and dry.  Psychiatric: She has a normal mood and affect.     Assessment/Plan Left L5-S1 stenosis with back pain, left lower extremity radiculopathy and weakness.   We will proceed with left L5-S1 facetectomy as scheduled.  Procedure along with possible recovery time.  Also discussed potential risk and complications with the procedure.  Patient stated that Dr. Louanne Skye had also discussed this with her in detail last office visit.  She voices understanding and wishes to proceed with surgery.  Benjiman Core, PA-C 08/12/2018, 10:44 AM

## 2018-08-13 NOTE — Telephone Encounter (Signed)
Pt is aware her rx is ready for pick up at the front desk 

## 2018-08-14 ENCOUNTER — Other Ambulatory Visit: Payer: Self-pay

## 2018-08-14 ENCOUNTER — Encounter (HOSPITAL_COMMUNITY)
Admission: RE | Disposition: A | Discharge status: Home / Self Care | Payer: Self-pay | Source: Ambulatory Visit | Attending: Specialist

## 2018-08-14 ENCOUNTER — Ambulatory Visit (HOSPITAL_COMMUNITY): Payer: No Typology Code available for payment source

## 2018-08-14 ENCOUNTER — Ambulatory Visit (HOSPITAL_COMMUNITY): Payer: No Typology Code available for payment source | Admitting: Anesthesiology

## 2018-08-14 ENCOUNTER — Encounter (HOSPITAL_COMMUNITY): Payer: Self-pay | Admitting: Surgery

## 2018-08-14 ENCOUNTER — Observation Stay (HOSPITAL_COMMUNITY)
Admission: RE | Admit: 2018-08-14 | Discharge: 2018-08-15 | Disposition: A | Discharge status: Home / Self Care | Payer: No Typology Code available for payment source | Source: Ambulatory Visit | Attending: Specialist | Admitting: Specialist

## 2018-08-14 DIAGNOSIS — M4807 Spinal stenosis, lumbosacral region: Secondary | ICD-10-CM | POA: Diagnosis not present

## 2018-08-14 DIAGNOSIS — M5126 Other intervertebral disc displacement, lumbar region: Secondary | ICD-10-CM | POA: Diagnosis present

## 2018-08-14 DIAGNOSIS — F17210 Nicotine dependence, cigarettes, uncomplicated: Secondary | ICD-10-CM | POA: Diagnosis not present

## 2018-08-14 DIAGNOSIS — M5117 Intervertebral disc disorders with radiculopathy, lumbosacral region: Secondary | ICD-10-CM | POA: Diagnosis not present

## 2018-08-14 DIAGNOSIS — D6859 Other primary thrombophilia: Secondary | ICD-10-CM | POA: Diagnosis not present

## 2018-08-14 DIAGNOSIS — Z419 Encounter for procedure for purposes other than remedying health state, unspecified: Secondary | ICD-10-CM

## 2018-08-14 DIAGNOSIS — Z86711 Personal history of pulmonary embolism: Secondary | ICD-10-CM | POA: Insufficient documentation

## 2018-08-14 DIAGNOSIS — Z79899 Other long term (current) drug therapy: Secondary | ICD-10-CM | POA: Insufficient documentation

## 2018-08-14 DIAGNOSIS — Z981 Arthrodesis status: Secondary | ICD-10-CM | POA: Insufficient documentation

## 2018-08-14 DIAGNOSIS — Z9889 Other specified postprocedural states: Secondary | ICD-10-CM

## 2018-08-14 HISTORY — PX: LUMBAR LAMINECTOMY/DECOMPRESSION MICRODISCECTOMY: SHX5026

## 2018-08-14 LAB — COMPREHENSIVE METABOLIC PANEL
ALBUMIN: 3.5 g/dL (ref 3.5–5.0)
ALK PHOS: 72 U/L (ref 38–126)
ALT: 17 U/L (ref 0–44)
AST: 21 U/L (ref 15–41)
Anion gap: 9 (ref 5–15)
BILIRUBIN TOTAL: 1 mg/dL (ref 0.3–1.2)
BUN: 9 mg/dL (ref 6–20)
CO2: 22 mmol/L (ref 22–32)
CREATININE: 0.86 mg/dL (ref 0.44–1.00)
Calcium: 9 mg/dL (ref 8.9–10.3)
Chloride: 108 mmol/L (ref 98–111)
GFR calc Af Amer: >60 mL/min (ref >60)
GLUCOSE: 105 mg/dL — AB (ref 70–99)
POTASSIUM: 4.8 mmol/L (ref 3.5–5.1)
Sodium: 139 mmol/L (ref 135–145)
TOTAL PROTEIN: 6.3 g/dL — AB (ref 6.5–8.1)

## 2018-08-14 LAB — PROTIME-INR
INR: 0.94
PROTHROMBIN TIME: 12.5 s (ref 11.4–15.2)

## 2018-08-14 LAB — APTT: aPTT: 22 seconds — ABNORMAL LOW (ref 24–36)

## 2018-08-14 SURGERY — LUMBAR LAMINECTOMY/DECOMPRESSION MICRODISCECTOMY
Anesthesia: General

## 2018-08-14 MED ORDER — LIDOCAINE 2% (20 MG/ML) 5 ML SYRINGE
INTRAMUSCULAR | Status: DC | PRN
  Administered 2018-08-14: 80 mg via INTRAVENOUS

## 2018-08-14 MED ORDER — OXYCODONE HCL 5 MG PO TABS
5.0000 mg | ORAL_TABLET | Freq: Once | ORAL | Status: AC | PRN
  Administered 2018-08-14: 5 mg via ORAL

## 2018-08-14 MED ORDER — FENTANYL CITRATE (PF) 100 MCG/2ML IJ SOLN
INTRAMUSCULAR | Status: AC
  Administered 2018-08-14: 50 ug via INTRAVENOUS
  Filled 2018-08-14: qty 2

## 2018-08-14 MED ORDER — MIDAZOLAM HCL 2 MG/2ML IJ SOLN
INTRAMUSCULAR | Status: AC
  Filled 2018-08-14: qty 2

## 2018-08-14 MED ORDER — PREGABALIN 75 MG PO CAPS
75.0000 mg | ORAL_CAPSULE | Freq: Two times a day (BID) | ORAL | Status: DC
  Administered 2018-08-14 – 2018-08-15 (×2): 75 mg via ORAL
  Filled 2018-08-14 (×2): qty 1

## 2018-08-14 MED ORDER — FENTANYL CITRATE (PF) 100 MCG/2ML IJ SOLN
25.0000 ug | INTRAMUSCULAR | Status: DC | PRN
  Administered 2018-08-14 (×3): 50 ug via INTRAVENOUS

## 2018-08-14 MED ORDER — MORPHINE SULFATE 15 MG PO TABS
30.0000 mg | ORAL_TABLET | ORAL | Status: DC | PRN
  Administered 2018-08-14 – 2018-08-15 (×3): 30 mg via ORAL
  Administered 2018-08-15: 15 mg via ORAL
  Filled 2018-08-14 (×4): qty 2

## 2018-08-14 MED ORDER — MIDAZOLAM HCL 5 MG/5ML IJ SOLN
INTRAMUSCULAR | Status: DC | PRN
  Administered 2018-08-14: 2 mg via INTRAVENOUS

## 2018-08-14 MED ORDER — CEFAZOLIN SODIUM-DEXTROSE 2-4 GM/100ML-% IV SOLN
2.0000 g | Freq: Three times a day (TID) | INTRAVENOUS | Status: AC
  Administered 2018-08-14 – 2018-08-15 (×2): 2 g via INTRAVENOUS
  Filled 2018-08-14 (×2): qty 100

## 2018-08-14 MED ORDER — KETOROLAC TROMETHAMINE 15 MG/ML IJ SOLN
15.0000 mg | Freq: Four times a day (QID) | INTRAMUSCULAR | Status: DC
  Administered 2018-08-14 – 2018-08-15 (×3): 15 mg via INTRAVENOUS
  Filled 2018-08-14 (×3): qty 1

## 2018-08-14 MED ORDER — BUPIVACAINE HCL 0.5 % IJ SOLN
INTRAMUSCULAR | Status: DC | PRN
  Administered 2018-08-14: 30 mL

## 2018-08-14 MED ORDER — THROMBIN 20000 UNITS EX KIT
PACK | CUTANEOUS | Status: AC
  Filled 2018-08-14: qty 1

## 2018-08-14 MED ORDER — 0.9 % SODIUM CHLORIDE (POUR BTL) OPTIME
TOPICAL | Status: DC | PRN
  Administered 2018-08-14: 1000 mL

## 2018-08-14 MED ORDER — LIDOCAINE 2% (20 MG/ML) 5 ML SYRINGE
INTRAMUSCULAR | Status: AC
  Filled 2018-08-14: qty 5

## 2018-08-14 MED ORDER — MENTHOL 3 MG MT LOZG: 1.0000 | LOZENGE | OROMUCOSAL | Status: DC | PRN

## 2018-08-14 MED ORDER — DEXAMETHASONE SODIUM PHOSPHATE 10 MG/ML IJ SOLN
INTRAMUSCULAR | Status: DC | PRN
  Administered 2018-08-14: 10 mg via INTRAVENOUS

## 2018-08-14 MED ORDER — OXYCODONE HCL 5 MG PO TABS
ORAL_TABLET | ORAL | Status: AC
  Administered 2018-08-14: 5 mg via ORAL
  Filled 2018-08-14: qty 1

## 2018-08-14 MED ORDER — ACETAMINOPHEN 650 MG RE SUPP: 650.0000 mg | RECTAL | Status: DC | PRN

## 2018-08-14 MED ORDER — ROCURONIUM BROMIDE 50 MG/5ML IV SOSY
PREFILLED_SYRINGE | INTRAVENOUS | Status: AC
  Filled 2018-08-14: qty 5

## 2018-08-14 MED ORDER — SODIUM CHLORIDE 0.9% FLUSH: 3.0000 mL | INTRAVENOUS | Status: DC | PRN

## 2018-08-14 MED ORDER — CEFAZOLIN SODIUM-DEXTROSE 2-4 GM/100ML-% IV SOLN
2.0000 g | INTRAVENOUS | Status: AC
  Administered 2018-08-14: 2 g via INTRAVENOUS
  Filled 2018-08-14: qty 100

## 2018-08-14 MED ORDER — ONDANSETRON HCL 4 MG/2ML IJ SOLN
INTRAMUSCULAR | Status: DC | PRN
  Administered 2018-08-14: 4 mg via INTRAVENOUS

## 2018-08-14 MED ORDER — DOCUSATE SODIUM 100 MG PO CAPS
100.0000 mg | ORAL_CAPSULE | Freq: Two times a day (BID) | ORAL | Status: DC
  Administered 2018-08-14 – 2018-08-15 (×2): 100 mg via ORAL
  Filled 2018-08-14 (×2): qty 1

## 2018-08-14 MED ORDER — ONDANSETRON HCL 4 MG/2ML IJ SOLN: 4.0000 mg | Freq: Four times a day (QID) | INTRAMUSCULAR | Status: DC | PRN

## 2018-08-14 MED ORDER — HYDROMORPHONE HCL 1 MG/ML IJ SOLN
INTRAMUSCULAR | Status: AC
  Administered 2018-08-14: 0.5 mg via INTRAVENOUS
  Filled 2018-08-14: qty 1

## 2018-08-14 MED ORDER — OXYCODONE HCL 5 MG/5ML PO SOLN: 5.0000 mg | Freq: Once | ORAL | Status: AC | PRN

## 2018-08-14 MED ORDER — ALUM & MAG HYDROXIDE-SIMETH 200-200-20 MG/5ML PO SUSP: 30.0000 mL | Freq: Four times a day (QID) | ORAL | Status: DC | PRN

## 2018-08-14 MED ORDER — BISACODYL 5 MG PO TBEC: 5.0000 mg | DELAYED_RELEASE_TABLET | Freq: Every day | ORAL | Status: DC | PRN

## 2018-08-14 MED ORDER — BUPIVACAINE LIPOSOME 1.3 % IJ SUSP
INTRAMUSCULAR | Status: DC | PRN
  Administered 2018-08-14: 20 mL

## 2018-08-14 MED ORDER — THROMBIN 20000 UNITS EX SOLR
CUTANEOUS | Status: DC | PRN
  Administered 2018-08-14: 14:00:00 via TOPICAL

## 2018-08-14 MED ORDER — ACETAMINOPHEN 325 MG PO TABS: 650.0000 mg | ORAL_TABLET | ORAL | Status: DC | PRN

## 2018-08-14 MED ORDER — PROPOFOL 10 MG/ML IV BOLUS
INTRAVENOUS | Status: DC | PRN
  Administered 2018-08-14: 160 mg via INTRAVENOUS

## 2018-08-14 MED ORDER — ALBUTEROL SULFATE HFA 108 (90 BASE) MCG/ACT IN AERS
INHALATION_SPRAY | RESPIRATORY_TRACT | Status: AC
  Filled 2018-08-14: qty 6.7

## 2018-08-14 MED ORDER — ONDANSETRON HCL 4 MG PO TABS: 4.0000 mg | ORAL_TABLET | Freq: Four times a day (QID) | ORAL | Status: DC | PRN

## 2018-08-14 MED ORDER — PHENOL 1.4 % MT LIQD: 1.0000 | OROMUCOSAL | Status: DC | PRN

## 2018-08-14 MED ORDER — LACTATED RINGERS IV SOLN
INTRAVENOUS | Status: DC | PRN
  Administered 2018-08-14 (×2): via INTRAVENOUS

## 2018-08-14 MED ORDER — PROMETHAZINE HCL 25 MG/ML IJ SOLN: 6.2500 mg | INTRAMUSCULAR | Status: DC | PRN

## 2018-08-14 MED ORDER — FENTANYL CITRATE (PF) 250 MCG/5ML IJ SOLN
INTRAMUSCULAR | Status: AC
  Filled 2018-08-14: qty 5

## 2018-08-14 MED ORDER — MORPHINE SULFATE (PF) 2 MG/ML IV SOLN
1.0000 mg | INTRAVENOUS | Status: DC | PRN
  Administered 2018-08-14: 1 mg via INTRAVENOUS
  Filled 2018-08-14: qty 1

## 2018-08-14 MED ORDER — FENTANYL CITRATE (PF) 100 MCG/2ML IJ SOLN
INTRAMUSCULAR | Status: DC | PRN
  Administered 2018-08-14: 50 ug via INTRAVENOUS
  Administered 2018-08-14: 100 ug via INTRAVENOUS

## 2018-08-14 MED ORDER — SODIUM CHLORIDE 0.9% FLUSH
3.0000 mL | Freq: Two times a day (BID) | INTRAVENOUS | Status: DC
  Administered 2018-08-14: 3 mL via INTRAVENOUS

## 2018-08-14 MED ORDER — SUGAMMADEX SODIUM 200 MG/2ML IV SOLN
INTRAVENOUS | Status: DC | PRN
  Administered 2018-08-14: 200 mg via INTRAVENOUS

## 2018-08-14 MED ORDER — CHLORHEXIDINE GLUCONATE 4 % EX LIQD: 60.0000 mL | Freq: Once | CUTANEOUS | Status: DC

## 2018-08-14 MED ORDER — MORPHINE SULFATE 30 MG PO TABS: 30.0000 mg | ORAL_TABLET | ORAL | Status: DC | PRN

## 2018-08-14 MED ORDER — KETAMINE HCL 10 MG/ML IJ SOLN
INTRAMUSCULAR | Status: DC | PRN
  Administered 2018-08-14: 40 mg via INTRAVENOUS
  Administered 2018-08-14: 10 mg via INTRAVENOUS

## 2018-08-14 MED ORDER — FLEET ENEMA 7-19 GM/118ML RE ENEM: 1.0000 | ENEMA | Freq: Once | RECTAL | Status: DC | PRN

## 2018-08-14 MED ORDER — METHOCARBAMOL 1000 MG/10ML IJ SOLN
500.0000 mg | Freq: Four times a day (QID) | INTRAVENOUS | Status: DC | PRN
  Filled 2018-08-14: qty 5

## 2018-08-14 MED ORDER — POLYETHYLENE GLYCOL 3350 17 G PO PACK: 17.0000 g | PACK | Freq: Every day | ORAL | Status: DC | PRN

## 2018-08-14 MED ORDER — DEXAMETHASONE SODIUM PHOSPHATE 10 MG/ML IJ SOLN
INTRAMUSCULAR | Status: AC
  Filled 2018-08-14: qty 1

## 2018-08-14 MED ORDER — KETAMINE HCL 50 MG/5ML IJ SOSY
PREFILLED_SYRINGE | INTRAMUSCULAR | Status: AC
  Filled 2018-08-14: qty 5

## 2018-08-14 MED ORDER — BUPIVACAINE HCL (PF) 0.5 % IJ SOLN
INTRAMUSCULAR | Status: AC
  Filled 2018-08-14: qty 30

## 2018-08-14 MED ORDER — METHOCARBAMOL 500 MG PO TABS
500.0000 mg | ORAL_TABLET | Freq: Four times a day (QID) | ORAL | Status: DC | PRN
  Administered 2018-08-14 – 2018-08-15 (×3): 500 mg via ORAL
  Filled 2018-08-14 (×3): qty 1

## 2018-08-14 MED ORDER — PHENYLEPHRINE 40 MCG/ML (10ML) SYRINGE FOR IV PUSH (FOR BLOOD PRESSURE SUPPORT)
PREFILLED_SYRINGE | INTRAVENOUS | Status: DC | PRN
  Administered 2018-08-14: 120 ug via INTRAVENOUS
  Administered 2018-08-14 (×2): 80 ug via INTRAVENOUS
  Administered 2018-08-14: 40 ug via INTRAVENOUS

## 2018-08-14 MED ORDER — ONDANSETRON HCL 4 MG/2ML IJ SOLN
INTRAMUSCULAR | Status: AC
  Filled 2018-08-14: qty 2

## 2018-08-14 MED ORDER — SODIUM CHLORIDE 0.9 % IV SOLN: 250.0000 mL | INTRAVENOUS | Status: DC

## 2018-08-14 MED ORDER — BUPIVACAINE LIPOSOME 1.3 % IJ SUSP
20.0000 mL | Freq: Once | INTRAMUSCULAR | Status: DC
  Filled 2018-08-14: qty 20

## 2018-08-14 MED ORDER — HYDROMORPHONE HCL 1 MG/ML IJ SOLN
0.5000 mg | INTRAMUSCULAR | Status: DC | PRN
  Administered 2018-08-14: 0.5 mg via INTRAVENOUS

## 2018-08-14 MED ORDER — ROCURONIUM BROMIDE 10 MG/ML (PF) SYRINGE
PREFILLED_SYRINGE | INTRAVENOUS | Status: DC | PRN
  Administered 2018-08-14: 50 mg via INTRAVENOUS
  Administered 2018-08-14: 30 mg via INTRAVENOUS

## 2018-08-14 SURGICAL SUPPLY — 55 items
ADH SKN CLS APL DERMABOND .7 (GAUZE/BANDAGES/DRESSINGS) ×1
APL SKNCLS STERI-STRIP NONHPOA (GAUZE/BANDAGES/DRESSINGS) ×1
BENZOIN TINCTURE PRP APPL 2/3 (GAUZE/BANDAGES/DRESSINGS) ×1 IMPLANT
BUR SABER RD CUTTING 3.0 (BURR) ×1 IMPLANT
CANISTER SUCT 3000ML PPV (MISCELLANEOUS) ×2 IMPLANT
CLSR STERI-STRIP ANTIMIC 1/2X4 (GAUZE/BANDAGES/DRESSINGS) ×1 IMPLANT
COVER SURGICAL LIGHT HANDLE (MISCELLANEOUS) ×3 IMPLANT
DECANTER SPIKE VIAL GLASS SM (MISCELLANEOUS) ×1 IMPLANT
DERMABOND ADVANCED (GAUZE/BANDAGES/DRESSINGS) ×1
DERMABOND ADVANCED .7 DNX12 (GAUZE/BANDAGES/DRESSINGS) ×1 IMPLANT
DRAPE HALF SHEET 40X57 (DRAPES) ×1 IMPLANT
DRAPE INCISE IOBAN 66X45 STRL (DRAPES) ×1 IMPLANT
DRAPE MICROSCOPE LEICA (MISCELLANEOUS) ×2 IMPLANT
DRAPE SURG 17X23 STRL (DRAPES) ×8 IMPLANT
DRSG MEPILEX BORDER 4X4 (GAUZE/BANDAGES/DRESSINGS) IMPLANT
DRSG MEPILEX BORDER 4X8 (GAUZE/BANDAGES/DRESSINGS) ×1 IMPLANT
DURAPREP 26ML APPLICATOR (WOUND CARE) ×2 IMPLANT
ELECT REM PT RETURN 9FT ADLT (ELECTROSURGICAL) ×2
ELECTRODE REM PT RTRN 9FT ADLT (ELECTROSURGICAL) ×1 IMPLANT
EVACUATOR 1/8 PVC DRAIN (DRAIN) IMPLANT
GLOVE BIOGEL PI IND STRL 8 (GLOVE) ×1 IMPLANT
GLOVE BIOGEL PI INDICATOR 8 (GLOVE) ×1
GLOVE ECLIPSE 9.0 STRL (GLOVE) ×2 IMPLANT
GLOVE ORTHO TXT STRL SZ7.5 (GLOVE) ×2 IMPLANT
GLOVE SURG 8.5 LATEX PF (GLOVE) ×2 IMPLANT
GOWN STRL REUS W/ TWL LRG LVL3 (GOWN DISPOSABLE) ×1 IMPLANT
GOWN STRL REUS W/TWL 2XL LVL3 (GOWN DISPOSABLE) ×4 IMPLANT
GOWN STRL REUS W/TWL LRG LVL3 (GOWN DISPOSABLE) ×2
KIT BASIN OR (CUSTOM PROCEDURE TRAY) ×2 IMPLANT
KIT TURNOVER KIT B (KITS) ×2 IMPLANT
NDL SPNL 18GX3.5 QUINCKE PK (NEEDLE) ×2 IMPLANT
NEEDLE SPNL 18GX3.5 QUINCKE PK (NEEDLE) ×4 IMPLANT
NS IRRIG 1000ML POUR BTL (IV SOLUTION) ×2 IMPLANT
PACK LAMINECTOMY ORTHO (CUSTOM PROCEDURE TRAY) ×2 IMPLANT
PAD ARMBOARD 7.5X6 YLW CONV (MISCELLANEOUS) ×4 IMPLANT
PATTIES SURGICAL .5 X.5 (GAUZE/BANDAGES/DRESSINGS) ×1 IMPLANT
PATTIES SURGICAL .75X.75 (GAUZE/BANDAGES/DRESSINGS) ×1 IMPLANT
PATTIES SURGICAL 1X1 (DISPOSABLE) IMPLANT
SPONGE LAP 4X18 RFD (DISPOSABLE) ×1 IMPLANT
SPONGE SURGIFOAM ABS GEL 100 (HEMOSTASIS) IMPLANT
SUT VIC AB 0 CT1 27 (SUTURE) ×2
SUT VIC AB 0 CT1 27XBRD ANBCTR (SUTURE) IMPLANT
SUT VIC AB 1 CT1 27 (SUTURE) ×2
SUT VIC AB 1 CT1 27XBRD ANBCTR (SUTURE) IMPLANT
SUT VIC AB 1 CTX 36 (SUTURE) ×4
SUT VIC AB 1 CTX36XBRD ANBCTR (SUTURE) IMPLANT
SUT VIC AB 2-0 CT1 27 (SUTURE) ×2
SUT VIC AB 2-0 CT1 TAPERPNT 27 (SUTURE) IMPLANT
SUT VIC AB 3-0 X1 27 (SUTURE) ×2 IMPLANT
SUT VICRYL 0 UR6 27IN ABS (SUTURE) ×2 IMPLANT
TOWEL GREEN STERILE (TOWEL DISPOSABLE) ×2 IMPLANT
TOWEL GREEN STERILE FF (TOWEL DISPOSABLE) ×2 IMPLANT
TRAY FOLEY MTR SLVR 16FR STAT (SET/KITS/TRAYS/PACK) IMPLANT
WATER STERILE IRR 1000ML POUR (IV SOLUTION) ×2 IMPLANT
YANKAUER SUCT BULB TIP NO VENT (SUCTIONS) ×1 IMPLANT

## 2018-08-14 NOTE — Op Note (Signed)
08/14/2018  4:30 PM  PATIENT:  [name] [name]  47 y.o. female  MRN: [id number]  OPERATIVE REPORT  PRE-OPERATIVE DIAGNOSIS:  left L5-S1 conjoint nerve root, recurrent herniated nucleus pulposus Left L5-S1, fusion L5-S1  POST-OPERATIVE DIAGNOSIS:  left L5-S1 conjoint nerve root, recurrent herniated nucleus pulposus Left L5-S1, fusion L5-S1  PROCEDURE:  Procedure(s): LEFT L5-S1 FACETECTOMY WITH SI PEDICULECTOMY AND EXPLORATION OF LEFT L5-S1 DISC    SURGEON:  Jessy Oto, MD     ASSISTANT:  Benjiman Core, PA-C  (Present throughout the entire procedure and necessary for completion of procedure in a timely manner)     ANESTHESIA:  General,    COMPLICATIONS:  None.     COMPONENTS:   PROCEDURE:The patient was met in the holding area, and the appropriate left Lumbar level L5-S1 identified and marked with "x" and my initials.The patient was then transported to OR and was placed under general anesthesia without difficulty. The patient received appropriate preoperative antibiotic prophylaxis. The patient after intubation atraumatically was transferred to the operating room table, prone position, Wilson frame, sliding OR table. All pressure points were well padded. The arms in 90-90 well-padded at the elbows. Standard prep with DuraPrep solution lower dorsal spine to the mid sacral segment. Draped in the usual manner iodine Vi-Drape was used. Time-out procedure was called and correct. The skin incision scar at L5-S1 marked and was infiltrated with Marcaine 0.5% 1:1 Exparel 1.3% total of 20 cc used. An incision approximately an inch inch and a half in length was then made through skin and subcutaneous layers ellipsing the old skin incision in line with the expected midline. An incision made into the left lumbosacral fascia approximately an inch in length along the left L5-S1 spinous processes. The paraspinal muscles elevated from the left side of the spinous processes and lamina of L5 and S1 Boss  McCoullough retractor then placed and a Penfield #4 placed at the inferior aspect of the left spinous process L5, a lateral radiograph identified the marker at the level of the inferior L5 spinous process. The spinous process then marked with a 0 vicryl suture. The operating room microscope sterilely draped brought into the field. Under the operating room microscope, the L5-S1 interspace carefully debrided the small amount of muscle attachment here and microcurettes used to define the previous laminotomy left L5-S1. A high-speed bur used to drill the medial aspect of the inferior articular process of L5. In order to medialize the laminotomy the left side of the L4 spinous process was thinned using a leskell rounger. Osteotomes used to resect bone over the left medial L5-S1 facet. 3 mm Kerrison then used to enter the spinal canal over the medial aspect of the L5 inferior articular process carefully using the Kerrison to debris the attachment as a curet. Foraminotomy was then performed over the left L5 nerve root. This was done with near complete resection of the left L5 inferior articular process.The medial 50% superior articular process of left S1 then resected using 3 mm Kerrison. The left L5 pedicle was visualized in the superior margin of the left L5 foramen. This allowed for identification of the thecal sac. Penfield 4 was then used to carefully mobilize the thecal sac medially and the left S1 nerve root identified within the lateral recess and exiting the S1 neuroforamen. The foramen overlying the S1 root. Using a Derricho for retraction the left S1 pedicle was defined and the superior margin resected with 3 mm kerrison, the cancellous portion of the superomedial pedicle  was drilled with high speed drill and osteotome then used to resect the superomedial aspect of the left S1 pedicle. Further foraminotomies was performed over the L5 nerve root the nerve root was noted to be exiting without further  compression. The SI nerve root able to be retracted along the medial aspect of the S1 pedicle and left posterior L5-S1 disc material found to be scarred at this level, the left ventral thecal sac freed off the posterior disc space with epstein curettes and scar rescected. In examining the left posterior disc at L5-S1 with [facility] and penfield #4 no significant disc fragment was found. The left L5 foramenotomy did however show significant residual reflected ligmentum flavum impressing on the left L5 nerve root.  Had a moderate amount of further resection of the L5 lamina inferiorly was performed. With this then the disc space at lateral L5-S1 was easily visualized and entry into the disc at the sided disc herniation was possible using a Penfield 4.The left L5-S1 lateral recess along the medial aspect inferior left S1 pedicle was well mobile and no further decompression was necessary. Ball tip nerve probe was then able to carefully palpate the neuroforamen for L5 and S1 finding these to be well decompressed. Bleeding was then controlled using thrombin-soaked Gelfoam small cottonoids. Small amount of bleeding within the soft tissue mass the laminotomy area was controlled using bipolar electrocautery. Irrigation was carried out using copious amounts of irrigant solution. All Gelfoam were then removed. No significant active bleeding present at the time of removal. All instruments sponge counts were correct traction system was then carefully removed. Lumbodorsal fascia was then carefully approximated with interrupted 0 Vicryl sutures with a UR 6 needle, the deep subcutaneous layers were approximated with interrupted 0 Vicryl sutures on UR 6 the appear subcutaneous layers approximated with interrupted 2-0 Vicryl sutures and the skin closed with a running subcutaneous stitch of 4-0 Vicryl. Dermabond was applied allowed to dry and then Mepilex bandage applied. Patient was then carefully returned to supine position on a  stretcher, reactivated and extubated. She was then returned to recovery room in satisfactory condition.  Benjiman Core, PA-C perform the duties of assistant surgeon during this case. He was present from the beginning of the case to the end of the case assisting in transfer the patient from his stretcher to the OR table and back to the stretcher at the end of the case. Assisted in careful retraction and suction of the laminectomy site delicate neural structures operating under the operating room microscope. He also assisted with closure of the incision from the fascia to the skin applying the dressing.        Basil Dess  08/14/2018, 4:30 PM

## 2018-08-14 NOTE — Brief Op Note (Signed)
08/14/2018  4:16 PM  PATIENT:  [name] [name]  47 y.o. female  PRE-OPERATIVE DIAGNOSIS:  left L5-S1 conjoint nerve root, recurrent herniated nucleus pulposus Left L5-S1, fusion L5-S1  POST-OPERATIVE DIAGNOSIS:  left L5-S1 conjoint nerve root, recurrent herniated nucleus pulposus Left L5-S1, fusion L5-S1  PROCEDURE:  Procedure(s): LEFT L5-S1 FACETECTOMY WITH SI PEDICULECTOMY AND EXPLORATION OF LEFT L5-S1 DISC (N/A)  SURGEON:  Surgeon(s) and Role:     Jessy Oto, MD - Primary  PHYSICIAN ASSISTANT: Benjiman Core, PA-C   ANESTHESIA:   local and general  EBL:  50 mL   BLOOD ADMINISTERED:none  DRAINS: none   LOCAL MEDICATIONS USED:  MARCAINE 0.5% 1:1 EXPAREL 1.3%  Amount: 30 ml  SPECIMEN:  No Specimen  DISPOSITION OF SPECIMEN:  N/A  COUNTS:  YES  TOURNIQUET:  * No tourniquets in log *  DICTATION: .Dragon Dictation  PLAN OF CARE: Admit for overnight observation  PATIENT DISPOSITION:  PACU - hemodynamically stable.   Delay start of Pharmacological VTE agent (>24hrs) due to surgical blood loss or risk of bleeding: yes

## 2018-08-14 NOTE — Anesthesia Procedure Notes (Signed)
Procedure Name: Intubation Date/Time: 08/14/2018 1:58 PM Performed by: Jenne Campus, CRNA Pre-anesthesia Checklist: Patient identified, Emergency Drugs available, Suction available and Patient being monitored Patient Re-evaluated:Patient Re-evaluated prior to induction Oxygen Delivery Method: Circle System Utilized Preoxygenation: Pre-oxygenation with 100% oxygen Induction Type: IV induction Ventilation: Mask ventilation without difficulty Laryngoscope Size: Miller and 2 Grade View: Grade I Tube type: Oral Tube size: 7.5 mm Number of attempts: 1 Airway Equipment and Method: Stylet Placement Confirmation: ETT inserted through vocal cords under direct vision,  positive ETCO2 and breath sounds checked- equal and bilateral Secured at: 22 cm Tube secured with: Tape Dental Injury: Teeth and Oropharynx as per pre-operative assessment

## 2018-08-14 NOTE — Interval H&P Note (Signed)
History and Physical Interval Note:  08/14/2018 1:07 PM  [name] [name]  has presented today for surgery, with the diagnosis of left L5-S1 conjoint nerve root, recurrent herniated nucleus pulposus Left L5-S1, fusion L5-S1  The various methods of treatment have been discussed with the patient and family. After consideration of risks, benefits and other options for treatment, the patient has consented to  Procedure(s): LEFT L5-S1 FACETECTOMY WITH SI PEDICULECTOMY AND EXPLORATION OF LEFT L5-S1 DISC (N/A) as a surgical intervention .  The patient's history has been reviewed, patient examined, no change in status, stable for surgery.  I have reviewed the patient's chart and labs.  Questions were answered to the patient's satisfaction.     Basil Dess

## 2018-08-14 NOTE — Anesthesia Preprocedure Evaluation (Addendum)
Anesthesia Evaluation  Patient identified by MRN, date of birth, ID band Patient awake    Reviewed: Allergy & Precautions, NPO status , Patient's Chart, lab work & pertinent test results  History of Anesthesia Complications Negative for: history of anesthetic complications  Airway Mallampati: II  TM Distance: >3 FB Neck ROM: Full    Dental  (+) Dental Advisory Given, Teeth Intact   Pulmonary Current Smoker, PE   breath sounds clear to auscultation       Cardiovascular (-) anginanegative cardio ROS   Rhythm:Regular Rate:Normal     Neuro/Psych  Left leg tingling and pain negative psych ROS   GI/Hepatic negative GI ROS, Neg liver ROS,   Endo/Other  negative endocrine ROS  Renal/GU negative Renal ROS     Musculoskeletal  (+) Arthritis ,   Abdominal   Peds  Hematology  Protein C Deficiency    Anesthesia Other Findings   Reproductive/Obstetrics  S/p hysterectomy                             Anesthesia Physical Anesthesia Plan  ASA: II  Anesthesia Plan: General   Post-op Pain Management:    Induction: Intravenous  PONV Risk Score and Plan: 4 or greater and Treatment may vary due to age or medical condition, Ondansetron, Scopolamine patch - Pre-op, Midazolam and Dexamethasone  Airway Management Planned: Oral ETT  Additional Equipment: None  Intra-op Plan:   Post-operative Plan: Extubation in OR  Informed Consent: I have reviewed the patients History and Physical, chart, labs and discussed the procedure including the risks, benefits and alternatives for the proposed anesthesia with the patient or authorized representative who has indicated his/her understanding and acceptance.   Dental advisory given  Plan Discussed with: CRNA and Anesthesiologist  Anesthesia Plan Comments:        Anesthesia Quick Evaluation

## 2018-08-14 NOTE — Transfer of Care (Signed)
Immediate Anesthesia Transfer of Care Note  Patient: [name] [name]  Procedure(s) Performed: LEFT L5-S1 FACETECTOMY WITH SI PEDICULECTOMY AND EXPLORATION OF LEFT L5-S1 DISC (N/A )  Patient Location: PACU  Anesthesia Type:General  Level of Consciousness: awake, alert  and oriented  Airway & Oxygen Therapy: Patient Spontanous Breathing and Patient connected to nasal cannula oxygen  Post-op Assessment: Report given to RN, Post -op Vital signs reviewed and stable and Patient moving all extremities  Post vital signs: Reviewed and stable  Last Vitals:  Vitals Value Taken Time  BP 133/81 08/14/2018  5:01 PM  Temp 36.5 C 08/14/2018  5:00 PM  Pulse 86 08/14/2018  5:05 PM  Resp 19 08/14/2018  5:05 PM  SpO2 96 % 08/14/2018  5:05 PM  Vitals shown include unvalidated device data.  Last Pain:  Vitals:   08/14/18 1100  TempSrc:   PainSc: 7       Patients Stated Pain Goal: 4 (85/[id number]/76 [id number])  Complications: No apparent anesthesia complications

## 2018-08-15 DIAGNOSIS — M5117 Intervertebral disc disorders with radiculopathy, lumbosacral region: Secondary | ICD-10-CM | POA: Diagnosis not present

## 2018-08-15 MED ORDER — NALOXONE HCL 4 MG/0.1ML NA LIQD: NASAL | 0 refills | Status: AC

## 2018-08-15 MED ORDER — METHOCARBAMOL 500 MG PO TABS: 500.0000 mg | ORAL_TABLET | Freq: Three times a day (TID) | ORAL | 1 refills | Status: DC | PRN

## 2018-08-15 MED ORDER — MORPHINE SULFATE 30 MG PO TABS: 30.0000 mg | ORAL_TABLET | ORAL | 0 refills | Status: DC | PRN

## 2018-08-15 NOTE — Evaluation (Signed)
Physical Therapy Evaluation Patient Details Name: [name] [name] MRN: [id number] DOB: [birth year] Today's Date: 08/15/2018   History of Present Illness  Patient is a 47 yo female with history of previous spinal surgeries who presents s/p LEFT L5-S1 FACETECTOMY WITH SI PEDICULECTOMY AND EXPLORATION OF LEFT L5-S1 DISC  Clinical Impression  Patient seen for mobility assessment s/p spinal surgery. Mobilizing fairly well but requires max encouragement with mobility. Educated patient on precautions, mobility expectations, safety and car transfers. Performed stair negotiation with caregiver assist.Will defer all further PT needs to HHPT. Will sign off.     Follow Up Recommendations Home health PT;Supervision - Intermittent(as set by physician)    Equipment Recommendations       Recommendations for Other Services       Precautions / Restrictions Precautions Precautions: Back Precaution Booklet Issued: Yes (comment) Precaution Comments: verbally reviewed with patient      Mobility  Bed Mobility               General bed mobility comments: recieved EOB  Transfers Overall transfer level: Needs assistance Equipment used: Rolling walker (2 wheeled) Transfers: Sit to/from Stand Sit to Stand: Min guard         General transfer comment: Max cues for encouragement and hand placement/positioning. Increased time and effort to perform  Ambulation/Gait Ambulation/Gait assistance: Supervision Gait Distance (Feet): 180 Feet Assistive device: Rolling walker (2 wheeled) Gait Pattern/deviations: Step-through pattern;Antalgic Gait velocity: decreased Gait velocity interpretation: <1.31 ft/sec, indicative of household ambulator General Gait Details: patient with heavy reliance on RW, increased time and effort noted. Educated on step through gait and increased cadence.   Stairs Stairs: Yes Stairs assistance: Min assist Stair Management: Step to pattern;Forwards Number of Stairs:  4 General stair comments: one person HHA provided by gf. Increased time to peform  Wheelchair Mobility    Modified Rankin (Stroke Patients Only)       Balance Overall balance assessment: Mild deficits observed, not formally tested                                           Pertinent Vitals/Pain Pain Assessment: Faces    Home Living Family/patient expects to be discharged to:: Private residence Living Arrangements: Spouse/significant other Available Help at Discharge: Family;Available 24 hours/day Type of Home: House       Home Layout: One level Home Equipment: [place] - 2 wheels;Cane - single point;Grab bars - tub/shower      Prior Function Level of Independence: Needs assistance   Gait / Transfers Assistance Needed: Using [facility] for functional mobility.  ADL's / Homemaking Assistance Needed:  Aide for assistance for LB ADLs  Comments: Family performs IADLs     Hand Dominance   Dominant Hand: Right    Extremity/Trunk Assessment   Upper Extremity Assessment Upper Extremity Assessment: Overall WFL for tasks assessed    Lower Extremity Assessment Lower Extremity Assessment: RLE deficits/detail RLE: Unable to fully assess due to pain RLE Sensation: decreased light touch    Cervical / Trunk Assessment Cervical / Trunk Assessment: (s/p spinal surgery)  Communication   Communication: No difficulties  Cognition Arousal/Alertness: Awake/alert Behavior During Therapy: WFL for tasks assessed/performed Overall Cognitive Status: Within Functional Limits for tasks assessed  General Comments: Benefits from increased time      General Comments      Exercises     Assessment/Plan    PT Assessment All further PT needs can be met in the next venue of care  PT Problem List Decreased strength;Decreased activity tolerance;Decreased balance;Decreased mobility;Pain       PT Treatment Interventions       PT Goals (Current goals can be found in the Care Plan section)  Acute Rehab PT Goals PT Goal Formulation: All assessment and education complete, DC therapy    Frequency     Barriers to discharge        Co-evaluation               AM-PAC PT "6 Clicks" Daily Activity  Outcome Measure Difficulty turning over in bed (including adjusting bedclothes, sheets and blankets)?: A Little Difficulty moving from lying on back to sitting on the side of the bed? : A Little Difficulty sitting down on and standing up from a chair with arms (e.g., wheelchair, bedside commode, etc,.)?: A Little Help needed moving to and from a bed to chair (including a wheelchair)?: A Little Help needed walking in hospital room?: A Little Help needed climbing 3-5 steps with a railing? : A Lot 6 Click Score: 17    End of Session   Activity Tolerance: Patient tolerated treatment well;Patient limited by fatigue;Patient limited by pain Patient left: in bed;with call bell/phone within reach;with family/visitor present(sitting EOB) Nurse Communication: Mobility status PT Visit Diagnosis: Difficulty in walking, not elsewhere classified (R26.2)    Time: 9914-4458 PT Time Calculation (min) (ACUTE ONLY): 18 min   Charges:   PT Evaluation $PT Eval Low Complexity: [place], PT DPT  Board Certified Neurologic Specialist [place] 08/15/2018, 9:42 AM

## 2018-08-15 NOTE — Anesthesia Postprocedure Evaluation (Signed)
Anesthesia Post Note  Patient: [name] [name]  Procedure(s) Performed: LEFT L5-S1 FACETECTOMY WITH SI PEDICULECTOMY AND EXPLORATION OF LEFT L5-S1 DISC (N/A )     Patient location during evaluation: PACU Anesthesia Type: General Level of consciousness: awake and alert, awake and oriented Pain management: pain level controlled Vital Signs Assessment: post-procedure vital signs reviewed and stable Respiratory status: spontaneous breathing, nonlabored ventilation and respiratory function stable Cardiovascular status: blood pressure returned to baseline and stable Postop Assessment: no apparent nausea or vomiting Anesthetic complications: no    Last Vitals:  Vitals:   08/15/18 0557 08/15/18 0727  BP: (!) 91/56 92/64  Pulse: 64 69  Resp:  16  Temp:  36.9 C  SpO2: 98% 96%    Last Pain:  Vitals:   08/15/18 0727  TempSrc: Oral  PainSc:                  Catalina Gravel

## 2018-08-15 NOTE — Progress Notes (Signed)
     Subjective: 1 Day Post-Op Procedure(s) (LRB): LEFT L5-S1 FACETECTOMY WITH SI PEDICULECTOMY AND EXPLORATION OF LEFT L5-S1 DISC (N/A) OOB to the bathroom, moderate pain. On MS IR 15 mg every 4 hours increased to 30 mg every 4 hours and she wishes for muscle relaxer.  Patient reports pain as moderate.    Objective:   VITALS:  Temp:  [97 F (36.1 C)-98.4 F (36.9 C)] 98.4 F (36.9 C) (08/31 0727) Pulse Rate:  [62-84] 69 (08/31 0727) Resp:  [12-21] 16 (08/31 0727) BP: (86-134)/(56-84) 92/64 (08/31 0727) SpO2:  [93 %-98 %] 96 % (08/31 0727)  Neurologically intact ABD soft Neurovascular intact Sensation intact distally Intact pulses distally Dorsiflexion/Plantar flexion intact Incision: dressing C/D/I and scant drainage   LABS No results for input(s): HGB, WBC, PLT in the last 72 hours. Recent Labs    08/14/18 1113  NA 139  K 4.8  CL 108  CO2 22  BUN 9  CREATININE 0.86  GLUCOSE 105*   Recent Labs    08/14/18 1113  INR 0.94     Assessment/Plan: 1 Day Post-Op Procedure(s) (LRB): LEFT L5-S1 FACETECTOMY WITH SI PEDICULECTOMY AND EXPLORATION OF LEFT L5-S1 DISC (N/A)  Advance diet Up with therapy D/C IV fluids Discharge home with home health  Discharge on MS IR 30 mg every 4-6 hours to control pain.  Narcan  Muscle relaxer. Basil Dess 08/15/2018, 10:53 AMPatient ID: [name] [name], female   DOB: Mar 26, 1971, 47 y.o.   MRN: [id number]

## 2018-08-15 NOTE — Evaluation (Signed)
Occupational Therapy Evaluation Patient Details Name: [name] [name] MRN: [id number] DOB: 02/06/1971 Today's Date: 08/15/2018    History of Present Illness Patient is a 47 yo female with history of previous spinal surgeries who presents s/p LEFT L5-S1 FACETECTOMY WITH SI PEDICULECTOMY AND EXPLORATION OF LEFT L5-S1 DISC   Clinical Impression   PTA, pt was living with her significant other and required assistance for LB ADLs and tub transfer from aide and family. Currently, pt requires Min Guard A-Min A for LB ADLs and functional mobility using RW. Provided education and handout on back precautions, LB ADLs with AE, toilet transfer, and tub transfer with tub bench; pt demonstrated understanding. Answered all pt questions. Recommend dc home once medically stable per physician. All acute OT needs met and will sign off. Thank you.     Follow Up Recommendations  Follow surgeon's recommendation for DC plan and follow-up therapies;Supervision/Assistance - 24 hour    Equipment Recommendations  Tub/shower bench;3 in 1 bedside commode    Recommendations for Other Services PT consult     Precautions / Restrictions Precautions Precautions: Back Precaution Booklet Issued: Yes (comment) Precaution Comments: verbally reviewed with patient Restrictions Weight Bearing Restrictions: No      Mobility Bed Mobility               General bed mobility comments: recieved EOB  Transfers Overall transfer level: Needs assistance Equipment used: Rolling walker (2 wheeled) Transfers: Sit to/from Stand Sit to Stand: Min assist;Min guard         General transfer comment: Max cues for encouragement and hand placement/positioning. Increased time and effort to perform. Min A for initial power up    Balance Overall balance assessment: Mild deficits observed, not formally tested                                         ADL either performed or assessed with clinical judgement    ADL Overall ADL's : Needs assistance/impaired Eating/Feeding: Independent   Grooming: Supervision/safety;Standing   Upper Body Bathing: Supervision/ safety;Set up;Sitting   Lower Body Bathing: Sit to/from stand;Min guard Lower Body Bathing Details (indicate cue type and reason): Min Guard A for safety Upper Body Dressing : Set up;Supervision/safety;Sitting   Lower Body Dressing: Minimal assistance;Sit to/from stand Lower Body Dressing Details (indicate cue type and reason): Min A for power up during donning pants. Providing education on AE and pt donning socks, shoes, and pants with AE. Pt also able to bring her ankles to her knees. Provided handout for AE to purchase  Toilet Transfer: Min guard;Ambulation;RW(Simulated in room)       Tub/ Shower Transfer: Min guard;Ambulation;Rolling walker;Tub transfer Tub/Shower Transfer Details (indicate cue type and reason): Educating tub transfer and pt verbalizing techniques she uses at home with aide Functional mobility during ADLs: Rolling walker;Min guard General ADL Comments: Pt with some self limiting behaviors. Providing education on back precautions, LB ADLs, toileting, and tub transfer     Vision         Perception     Praxis      Pertinent Vitals/Pain Pain Assessment: Faces Faces Pain Scale: Hurts little more Pain Location: Back Pain Descriptors / Indicators: Constant;Discomfort;Grimacing Pain Intervention(s): Monitored during session;Repositioned;Limited activity within patient's tolerance     Hand Dominance Right   Extremity/Trunk Assessment Upper Extremity Assessment Upper Extremity Assessment: Overall WFL for tasks assessed   Lower Extremity Assessment  Lower Extremity Assessment: RLE deficits/detail RLE: Unable to fully assess due to pain RLE Sensation: decreased light touch   Cervical / Trunk Assessment Cervical / Trunk Assessment: (s/p spinal surgery)   Communication Communication Communication: No  difficulties   Cognition Arousal/Alertness: Awake/alert Behavior During Therapy: WFL for tasks assessed/performed Overall Cognitive Status: Within Functional Limits for tasks assessed                                 General Comments: Benefits from increased time   General Comments       Exercises     Shoulder Instructions      Home Living Family/patient expects to be discharged to:: Private residence Living Arrangements: Spouse/significant other Available Help at Discharge: Family;Available 24 hours/day Type of Home: House Home Access: Stairs to enter [employer] of Steps: 3   Home Layout: One level     Bathroom Shower/Tub: Tub/shower unit;Curtain   Biochemist, clinical: Standard     Home Equipment: Environmental consultant - 2 wheels;Cane - single point;Grab bars - tub/shower   Additional Comments: Aide 5 days a week for 3 hours      Prior Functioning/Environment Level of Independence: Needs assistance  Gait / Transfers Assistance Needed: Using [facility] for functional mobility. ADL's / Homemaking Assistance Needed:  Aide for assistance for LB ADLs and tub transfer.    Comments: Family performs IADLs        OT Problem List: Decreased range of motion;Decreased activity tolerance;Impaired balance (sitting and/or standing);Decreased knowledge of use of DME or AE;Decreased knowledge of precautions;Pain      OT Treatment/Interventions:      OT Goals(Current goals can be found in the care plan section) Acute Rehab OT Goals Patient Stated Goal: "Get equipment to make me safe at home" OT Goal Formulation: All assessment and education complete, DC therapy  OT Frequency:     Barriers to D/C:            Co-evaluation              AM-PAC PT "6 Clicks" Daily Activity     Outcome Measure Help from another person eating meals?: None Help from another person taking care of personal grooming?: A Little Help from another person toileting, which includes using  toliet, bedpan, or urinal?: A Little Help from another person bathing (including washing, rinsing, drying)?: A Little Help from another person to put on and taking off regular upper body clothing?: None Help from another person to put on and taking off regular lower body clothing?: A Little 6 Click Score: 20   End of Session Equipment Utilized During Treatment: Rolling walker Nurse Communication: Mobility status;Precautions  Activity Tolerance: Patient tolerated treatment well;Patient limited by pain Patient left: in bed;with call bell/phone within reach;with family/visitor present  OT Visit Diagnosis: Unsteadiness on feet (R26.81);Other abnormalities of gait and mobility (R26.89);Muscle weakness (generalized) (M62.81);Pain Pain - part of body: (Back)                Time: 1324-4010 OT Time Calculation (min): 23 min Charges:  OT General Charges $OT Visit: 1 Visit OT Evaluation $OT Eval Low Complexity: 1 Low OT Treatments $Self Care/Home Management : 8-22 mins  Armari Fussell MSOT, OTR/L Acute Rehab Pager: [phone] Office: [place] 08/15/2018, 10:26 AM

## 2018-08-15 NOTE — Discharge Instructions (Signed)
    No lifting greater than 10 lbs. Avoid bending, stooping and twisting. Walk in house for first week them may start to get out slowly increasing distance up to one mile by 3 weeks post op. Keep incision dry for 3 days, may use tegaderm or similar water impervious dressing.  

## 2018-08-15 NOTE — Care Management Note (Addendum)
Case Management Note  Patient Details  Name: AYANAH SNADER MRN: [id number] Date of Birth: [birth year]  Subjective/Objective:                    Action/Plan:  Patient is to DC to home w [facility] PT OT.  Coverage through [place].  Patient will DC with DME. Referral placed to [facility] for [facility] services. Per Brenton Grills he will contact [facility] on Tuesday when offices open for authorization for [facility]. Services will start once authorization obtained next week.  Patient states Kristi at Dr [facility]th[facility] Ket office and [place] her CSW at the [place] both have the auth numbers for her [facility].     Expected Discharge Date:                  Expected Discharge Plan:  [place]  In-House Referral:     Discharge planning Services  CM Consult  Post Acute Care Choice:  Durable Medical Equipment, Home Health Choice offered to:     DME Arranged:    DME Agency:     HH Arranged:  PT, OT HH Agency:  [place]  Status of Service:  In process, will continue to follow  If discussed at Long Length of Stay Meetings, dates discussed:    Additional Comments:  Carles Collet, RN 08/15/2018, 9:36 AM

## 2018-08-16 ENCOUNTER — Encounter (HOSPITAL_COMMUNITY): Payer: Self-pay | Admitting: Specialist

## 2018-08-18 ENCOUNTER — Telehealth (INDEPENDENT_AMBULATORY_CARE_PROVIDER_SITE_OTHER): Payer: Self-pay | Admitting: Specialist

## 2018-08-18 MED FILL — Thrombin For Soln Kit 20000 Unit: CUTANEOUS | Qty: 1 | Status: AC

## 2018-08-18 NOTE — Telephone Encounter (Signed)
Faxed 7/18 ov note & 8/30 op note to Missy @ [facility] [phone], callback [phone] ext [phone]

## 2018-08-18 NOTE — Telephone Encounter (Signed)
Patient called asked if it is ok to change bandage after her shower. Patient asked for a call back as soon as possible. The number to contact patient is [phone]

## 2018-08-19 NOTE — Telephone Encounter (Signed)
I spoke with [name], he states that she needs to wait 1 week before she showers, to let water run over area, and pat dry and replace with a dry dressing, and not to use any ointments or creams on it. I called and advised patient and she states that understands.

## 2018-08-26 ENCOUNTER — Telehealth (INDEPENDENT_AMBULATORY_CARE_PROVIDER_SITE_OTHER): Payer: Self-pay

## 2018-08-26 NOTE — Telephone Encounter (Signed)
Alan with [facility] would like verbal orders for HHPT for patient, 1 x week for 1 week, and 2 x week for 4 weeks.  Cb# is [phone].  Please advise.  Thank you.

## 2018-08-27 ENCOUNTER — Encounter (INDEPENDENT_AMBULATORY_CARE_PROVIDER_SITE_OTHER): Payer: Self-pay | Admitting: Surgery

## 2018-08-27 ENCOUNTER — Telehealth (INDEPENDENT_AMBULATORY_CARE_PROVIDER_SITE_OTHER): Payer: Self-pay | Admitting: Specialist

## 2018-08-27 ENCOUNTER — Ambulatory Visit (INDEPENDENT_AMBULATORY_CARE_PROVIDER_SITE_OTHER): Payer: PRIVATE HEALTH INSURANCE | Admitting: Surgery

## 2018-08-27 VITALS — BP 98/67 | HR 96 | Ht 67.0 in | Wt 175.0 lb

## 2018-08-27 DIAGNOSIS — M4807 Spinal stenosis, lumbosacral region: Secondary | ICD-10-CM

## 2018-08-27 MED ORDER — METHOCARBAMOL 500 MG PO TABS: 500.0000 mg | ORAL_TABLET | Freq: Three times a day (TID) | ORAL | 1 refills | Status: DC | PRN

## 2018-08-27 MED ORDER — MORPHINE SULFATE 30 MG PO TABS: 30.0000 mg | ORAL_TABLET | ORAL | 0 refills | Status: DC | PRN

## 2018-08-27 NOTE — Telephone Encounter (Signed)
I called and gave verbal ok to [employer]

## 2018-08-27 NOTE — Telephone Encounter (Signed)
[name] [name],  This patient is a Hospital f/u that seen [name] 08/27/18 but needs to see Nitka in 5 weeks. I scheduled her w/James on 09/03/18.  Please add her to your cancellation list.

## 2018-08-27 NOTE — Discharge Summary (Signed)
Patient ID: [name] [name] MRN: [id number] DOB/AGE: [birth year] 47 y.o.  Admit date: 08/14/2018 Discharge date: 08/27/2018  Admission Diagnoses:  Active Problems:   Recurrent displacement of lumbar disc   Status post lumbar laminectomy   Discharge Diagnoses:  Active Problems:   Recurrent displacement of lumbar disc   Status post lumbar laminectomy  status post Procedure(s): LEFT L5-S1 FACETECTOMY WITH SI PEDICULECTOMY AND EXPLORATION OF LEFT L5-S1 DISC  Past Medical History:  Diagnosis Date  . Arthritis   . Chronic back pain    stenosis and HNP  . History of pulmonary embolus (PE) 03/2008  . Pneumonia    "walking" in the 90's  . Urinary urgency   . Weakness    numbness and tingling on left side    Surgeries: Procedure(s): LEFT L5-S1 FACETECTOMY WITH SI PEDICULECTOMY AND EXPLORATION OF LEFT L5-S1 DISC on 08/14/2018   Consultants:   Discharged Condition: Improved  Hospital Course: [name] [name] is an 47 y.o. female who was admitted 08/14/2018 for operative treatment of lumbar stenosis. Patient failed conservative treatments (please see the history and physical for the specifics) and had severe unremitting pain that affects sleep, daily activities and work/hobbies. After pre-op clearance, the patient was taken to the operating room on 08/14/2018 and underwent  Procedure(s): LEFT L5-S1 FACETECTOMY WITH SI PEDICULECTOMY AND EXPLORATION OF LEFT L5-S1 DISC.    Patient was given perioperative antibiotics:  Anti-infectives (From admission, onward)   Start     Dose/Rate Route Frequency Ordered Stop   08/14/18 2200  ceFAZolin (ANCEF) IVPB 2g/100 mL premix     2 g 200 mL/hr over 30 Minutes Intravenous Every 8 hours 08/14/18 1826 08/15/18 0557   08/14/18 1045  ceFAZolin (ANCEF) IVPB 2g/100 mL premix     2 g 200 mL/hr over 30 Minutes Intravenous On call to O.R. 08/14/18 1035 08/14/18 1402       Patient was given sequential compression devices and early ambulation to  prevent DVT.   Patient benefited maximally from hospital stay and there were no complications. At the time of discharge, the patient was urinating/moving their bowels without difficulty, tolerating a regular diet, pain is controlled with oral pain medications and they have been cleared by PT/OT.   Recent vital signs: No data found.   Recent laboratory studies: No results for input(s): WBC, HGB, HCT, PLT, NA, K, CL, CO2, BUN, CREATININE, GLUCOSE, INR, CALCIUM in the last 72 hours.  Invalid input(s): PT, 2   Discharge Medications:   Allergies as of 08/15/2018      Reactions   Hydrocodone-acetaminophen Nausea Only      Medication List    STOP taking these medications   morphine 15 MG tablet Commonly known as:  MSIR     TAKE these medications   methocarbamol 500 MG tablet Commonly known as:  ROBAXIN Take 1 tablet (500 mg total) by mouth every 8 (eight) hours as needed for muscle spasms.   naloxone 4 MG/0.1ML Liqd nasal spray kit Commonly known as:  NARCAN Use as directed for opioid respiratory depression or overdose.   pregabalin 75 MG capsule Commonly known as:  LYRICA Take 1 capsule (75 mg total) by mouth 2 (two) times daily.     ASK your doctor about these medications   morphine 30 MG tablet Commonly known as:  MSIR Take 1 tablet (30 mg total) by mouth every 4 (four) hours as needed for up to 7 days for moderate pain or severe pain. Ask about: Should I take  this medication?       Diagnostic Studies: Dg Lumbar Spine 1 View  Result Date: 08/14/2018 CLINICAL DATA:  47 year old female undergoing lumbar surgery. EXAM: LUMBAR SPINE - 1 VIEW COMPARISON:  CT lumbar spine 05/19/2017. FINDINGS: Normal lumbar segmentation demonstrated on the comparison. Intraoperative portable cross-table lateral view of the lumbar spine at 1425 hours. Previous anterior interbody L5-S1 fusion. Solitary surgical probe projects at the L5 spinous process level. IMPRESSION: Intraoperative localization  at L5. Electronically Signed   By: Genevie Ann M.D.   On: 08/14/2018 15:53    Discharge Instructions    Call MD / Call 911   Complete by:  As directed    If you experience chest pain or shortness of breath, CALL 911 and be transported to the hospital emergency room.  If you develope a fever above 101 F, pus (white drainage) or increased drainage or redness at the wound, or calf pain, call your surgeon's office.   Constipation Prevention   Complete by:  As directed    Drink plenty of fluids.  Prune juice may be helpful.  You may use a stool softener, such as Colace (over the counter) 100 mg twice a day.  Use MiraLax (over the counter) for constipation as needed.   Diet - low sodium heart healthy   Complete by:  As directed    Discharge instructions   Complete by:  As directed    No lifting greater than 10 lbs. Avoid bending, stooping and twisting. Walk in house for first week them may start to get out slowly increasing distance up to one mile by 3 weeks post op. Keep incision dry for 3 days, may use tegaderm or similar water impervious dressing.   Driving restrictions   Complete by:  As directed    No driving for 3 weeks   Increase activity slowly as tolerated   Complete by:  As directed    Lifting restrictions   Complete by:  As directed    No lifting for 4 weeks      Follow-up Information    Jessy Oto, MD In 2 weeks.   Specialty:  Orthopedic Surgery Why:  For wound re-check Contact information: [place] [address] [phone]           Discharge Plan:  discharge to home  Disposition:     Signed: Benjiman Core  08/27/2018, 11:37 AM

## 2018-08-27 NOTE — Telephone Encounter (Signed)
I did put her on the cancellation list

## 2018-09-01 ENCOUNTER — Encounter (INDEPENDENT_AMBULATORY_CARE_PROVIDER_SITE_OTHER): Payer: Self-pay | Admitting: Surgery

## 2018-09-01 NOTE — Progress Notes (Signed)
Post-Op Visit Note   Patient: [name] [name]           Date of Birth: 18-Apr-1971           MRN: [id number] Visit Date: 08/27/2018 PCP: Buzzy Han, MD   Assessment & Plan:  Chief Complaint:  Chief Complaint  Patient presents with  . Lower Back - Routine Post Op, Wound Check   Patient returns for recheck.   Status post left L5-S1 facetectomy and S1 pediculectomy.  She continues to have obvious pain and weakness in the left lower extremity but states that there may have been a slight improvement of the pain.  Complains of discomfort along the left side of her low back and buttock.  No complaints of fever chills.  Visit Diagnoses:  1. Spinal stenosis of lumbosacral region     Plan: Patient will follow-up in the office in 1 week for wound check.  Avoid bending twisting lifting.  Refilled pain medication.  Follow-Up Instructions: Return in about 1 week (around 09/03/2018) for with Aviv Lengacher.   Orders:  No orders of the defined types were placed in this encounter.  Meds ordered this encounter  Medications  . morphine (MSIR) 30 MG tablet    Sig: Take 1 tablet (30 mg total) by mouth every 4 (four) hours as needed for severe pain.    Dispense:  40 tablet    Refill:  0  . methocarbamol (ROBAXIN) 500 MG tablet    Sig: Take 1 tablet (500 mg total) by mouth every 8 (eight) hours as needed for muscle spasms.    Dispense:  40 tablet    Refill:  1    Imaging: No results found.  PMFS History: Patient Active Problem List   Diagnosis Date Noted  . Recurrent displacement of lumbar disc 08/14/2018    Class: Chronic  . Status post lumbar laminectomy 08/14/2018  . S/P lumbar and lumbosacral fusion by anterior technique 10/09/2016    Class: Status post  . Disc disease, degenerative, lumbar or lumbosacral 08/26/2016    Class: Chronic  . 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  . Right lower quadrant pain 06/06/2014  . Herniated nucleus pulposus, lumbar 12/10/2013    . S/P hysterectomy 02/09/2013  . Pulmonary embolism ([place])   . Protein C deficiency ([place])   . Fibroids   . Endometriosis    Past Medical History:  Diagnosis Date  . Arthritis   . Chronic back pain    stenosis and HNP  . History of pulmonary embolus (PE) 03/2008  . Pneumonia    "walking" in the 90's  . Urinary urgency   . Weakness    numbness and tingling on left side    No family history on file.  Past Surgical History:  Procedure Laterality Date  . ABDOMINAL EXPOSURE N/A 08/26/2016   Procedure: ABDOMINAL EXPOSURE;  Surgeon: Rosetta Posner, MD;  Location: [place];  Service: Vascular;  Laterality: N/A;  . ABDOMINAL HYSTERECTOMY    . ANTERIOR LUMBAR FUSION N/A 08/26/2016   Procedure: ANTERIOR LUMBAR FUSION L5-S1, EXCISION OF HERNIATED DISC LEFT L5-S1, ZERO P LUMBAR INTERBODY IMPLANT, LEFT ILIAC CREST BONE GRAFT ;  Surgeon: Jessy Oto, MD;  Location: [facility];  Service: Orthopedics;  Laterality: N/A;  . BACK SURGERY    . DIAGNOSTIC LAPAROSCOPY  2004   x3  . DILATION AND EVACUATION N/A 01/28/2013   Procedure: DILATATION AND EVACUATION;  Surgeon: Princess Bruins, MD;  Location: [facility];  Service: Gynecology;  Laterality:  N/A;  . KNEE ARTHROSCOPY Left    x 3  . LUMBAR LAMINECTOMY N/A 12/10/2013   Procedure: LEFT L5-S1 MICRODISCECTOMY USING MIS;  Surgeon: Jessy Oto, MD;  Location: [place];  Service: Orthopedics;  Laterality: N/A;  . LUMBAR LAMINECTOMY N/A 12/30/2014   Procedure: LEFT L5-S1 Redo MICRODISCECTOMY;  Surgeon: Jessy Oto, MD;  Location: [place];  Service: Orthopedics;  Laterality: N/A;  . LUMBAR LAMINECTOMY/DECOMPRESSION MICRODISCECTOMY N/A 08/14/2018   Procedure: LEFT L5-S1 FACETECTOMY WITH SI PEDICULECTOMY AND EXPLORATION OF LEFT L5-S1 DISC;  Surgeon: Jessy Oto, MD;  Location: [place];  Service: Orthopedics;  Laterality: N/A;  . ROBOTIC ASSISTED TOTAL HYSTERECTOMY N/A 02/09/2013   Procedure: ROBOTIC ASSISTED TOTAL HYSTERECTOMY;  Surgeon: Princess Bruins, MD;  Location:  [facility];  Service: Gynecology;  Laterality: N/A;  . UNILATERAL SALPINGECTOMY Right 02/09/2013   Procedure: UNILATERAL SALPINGECTOMY;  Surgeon: Princess Bruins, MD;  Location: [facility];  Service: Gynecology;  Laterality: Right;  . WISDOM TOOTH EXTRACTION     Social History   Occupational History  . Not on file  Tobacco Use  . Smoking status: Current Every Day Smoker    Packs/day: 0.50    Years: 25.00    Pack years: 12.50    Types: Cigarettes  . Smokeless tobacco: Never Used  Substance and Sexual Activity  . Alcohol use: Yes  . Drug use: No  . Sexual activity: Not Currently    Birth control/protection: Surgical   Exam Wound healing well.  Skin edges are not completely together.  May be a millimeter or 2 of separation.  No drainage or signs of infection.

## 2018-09-03 ENCOUNTER — Ambulatory Visit (INDEPENDENT_AMBULATORY_CARE_PROVIDER_SITE_OTHER): Payer: PRIVATE HEALTH INSURANCE | Admitting: Surgery

## 2018-09-03 ENCOUNTER — Encounter (INDEPENDENT_AMBULATORY_CARE_PROVIDER_SITE_OTHER): Payer: Self-pay | Admitting: Surgery

## 2018-09-03 DIAGNOSIS — M4807 Spinal stenosis, lumbosacral region: Secondary | ICD-10-CM

## 2018-09-03 MED ORDER — MORPHINE SULFATE 30 MG PO TABS: 30.0000 mg | ORAL_TABLET | ORAL | 0 refills | Status: DC | PRN

## 2018-09-03 NOTE — Progress Notes (Signed)
Ms. Mullet returns for wound check.  She is 3 weeks postop.  Exam Wound looks good.  No drainage signs of infection.  She does have some scab over incision.   Plan Continue home health PT.  Follow with Dr. Louanne Skye in 3 weeks for recheck.  Advised to not attempt to pick at the scab and just let it fall off on its own.  Refilled morphine prescription.

## 2018-09-04 ENCOUNTER — Telehealth (INDEPENDENT_AMBULATORY_CARE_PROVIDER_SITE_OTHER): Payer: Self-pay | Admitting: Specialist

## 2018-09-04 NOTE — Telephone Encounter (Signed)
done

## 2018-09-04 NOTE — Telephone Encounter (Signed)
[place]  [phone]    Angie called to speak with [name] to discuss patient care. Angie is currently with the patient at the moment.

## 2018-09-04 NOTE — Telephone Encounter (Signed)
Can you please call 

## 2018-09-09 ENCOUNTER — Other Ambulatory Visit (INDEPENDENT_AMBULATORY_CARE_PROVIDER_SITE_OTHER): Payer: Self-pay | Admitting: Specialist

## 2018-09-09 NOTE — Telephone Encounter (Signed)
Morphine (MSIR)30 mg tablet

## 2018-09-09 NOTE — Telephone Encounter (Signed)
Sent request to Dr. Nitka 

## 2018-09-10 MED ORDER — MORPHINE SULFATE 30 MG PO TABS: 30.0000 mg | ORAL_TABLET | ORAL | 0 refills | Status: DC | PRN

## 2018-09-11 NOTE — Telephone Encounter (Signed)
Pt is aware rx is at the front desk ready for pick up

## 2018-09-17 ENCOUNTER — Other Ambulatory Visit (INDEPENDENT_AMBULATORY_CARE_PROVIDER_SITE_OTHER): Payer: Self-pay | Admitting: Specialist

## 2018-09-17 MED ORDER — MORPHINE SULFATE 30 MG PO TABS: 15.0000 mg | ORAL_TABLET | ORAL | 0 refills | Status: DC | PRN

## 2018-09-17 NOTE — Telephone Encounter (Signed)
Patient called requesting refill of Morphine 30  Please call patient to advise

## 2018-09-17 NOTE — Telephone Encounter (Signed)
Sent request to Dr. Nitka 

## 2018-09-18 NOTE — Telephone Encounter (Signed)
I called and LMOM for pt that her rx was ready for pick up at the front desk.

## 2018-09-22 ENCOUNTER — Other Ambulatory Visit (INDEPENDENT_AMBULATORY_CARE_PROVIDER_SITE_OTHER): Payer: Self-pay | Admitting: Specialist

## 2018-09-22 ENCOUNTER — Telehealth (INDEPENDENT_AMBULATORY_CARE_PROVIDER_SITE_OTHER): Payer: Self-pay | Admitting: Specialist

## 2018-09-22 NOTE — Telephone Encounter (Signed)
Sent request to Dr. Nitka 

## 2018-09-22 NOTE — Telephone Encounter (Signed)
Patient called requesting an RX refill on her Methocarbamol called into her pharmacy.  [facility] [address].  CB#[phone].  Thank you.

## 2018-09-22 NOTE — Telephone Encounter (Signed)
Melinda faxed a plan of Care form over to be signed by [place] and has not received it back.  Please call Rip Harbour to advise.  [phone]

## 2018-09-23 MED ORDER — METHOCARBAMOL 500 MG PO TABS: 500.0000 mg | ORAL_TABLET | Freq: Three times a day (TID) | ORAL | 1 refills | Status: DC | PRN

## 2018-09-24 ENCOUNTER — Ambulatory Visit (INDEPENDENT_AMBULATORY_CARE_PROVIDER_SITE_OTHER): Payer: PRIVATE HEALTH INSURANCE | Admitting: Specialist

## 2018-09-24 ENCOUNTER — Encounter (INDEPENDENT_AMBULATORY_CARE_PROVIDER_SITE_OTHER): Payer: Self-pay | Admitting: Specialist

## 2018-09-24 ENCOUNTER — Ambulatory Visit (INDEPENDENT_AMBULATORY_CARE_PROVIDER_SITE_OTHER): Payer: Self-pay

## 2018-09-24 VITALS — BP 117/84 | HR 75 | Ht 67.0 in | Wt 175.0 lb

## 2018-09-24 DIAGNOSIS — M4807 Spinal stenosis, lumbosacral region: Secondary | ICD-10-CM

## 2018-09-24 DIAGNOSIS — M5417 Radiculopathy, lumbosacral region: Secondary | ICD-10-CM

## 2018-09-24 MED ORDER — DICLOFENAC SODIUM 50 MG PO TBEC: DELAYED_RELEASE_TABLET | ORAL | 0 refills | Status: DC

## 2018-09-24 NOTE — Telephone Encounter (Signed)
[facility] for [facility]

## 2018-09-24 NOTE — Progress Notes (Signed)
Post-Op Visit Note   Patient: [name] [name]           Date of Birth: 1971/11/01           MRN: [id number] Visit Date: 09/24/2018 PCP: Buzzy Han, MD   Assessment & Plan:5 weeks post op left L5-S1 exploration with decompression of the left L5 nerve root and left L5-S1 disc   Chief Complaint:  Chief Complaint  Patient presents with  . Lower Back - Routine Post Op   Visit Diagnoses:  1. Spinal stenosis of lumbosacral region     Plan:Avoid frequent bending and stooping  No lifting greater than 10 lbs. May use ice or moist heat for pain. Weight loss is of benefit. Best medication for lumbar disc disease is arthritis medications like motrin, celebrex and naprosyn. Exercise is important to improve your indurance and does allow people to function better inspite of back pain.    Follow-Up Instructions: No follow-ups on file.   Orders:  Orders Placed This Encounter  Procedures  . XR Lumbar Spine 2-3 Views   No orders of the defined types were placed in this encounter.   Imaging: No results found.  PMFS History: Patient Active Problem List   Diagnosis Date Noted  . Recurrent displacement of lumbar disc 08/14/2018    Priority: High    Class: Chronic  . S/P lumbar and lumbosacral fusion by anterior technique 10/09/2016    Priority: High    Class: Status post  . Disc disease, degenerative, lumbar or lumbosacral 08/26/2016    Priority: High    Class: Chronic  . Herniated nucleus pulposus, lumbar 12/10/2013    Priority: High  . Status post lumbar laminectomy 08/14/2018  . 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  . Right lower quadrant pain 06/06/2014  . S/P hysterectomy 02/09/2013  . Pulmonary embolism ([place])   . Protein C deficiency ([place])   . Fibroids   . Endometriosis    Past Medical History:  Diagnosis Date  . Arthritis   . Chronic back pain    stenosis and HNP  . History of pulmonary embolus (PE) 03/2008  . Pneumonia    "walking"  in the 90's  . Urinary urgency   . Weakness    numbness and tingling on left side    No family history on file.  Past Surgical History:  Procedure Laterality Date  . ABDOMINAL EXPOSURE N/A 08/26/2016   Procedure: ABDOMINAL EXPOSURE;  Surgeon: Rosetta Posner, MD;  Location: [facility];  Service: Vascular;  Laterality: N/A;  . ABDOMINAL HYSTERECTOMY    . ANTERIOR LUMBAR FUSION N/A 08/26/2016   Procedure: ANTERIOR LUMBAR FUSION L5-S1, EXCISION OF HERNIATED DISC LEFT L5-S1, ZERO P LUMBAR INTERBODY IMPLANT, LEFT ILIAC CREST BONE GRAFT ;  Surgeon: Jessy Oto, MD;  Location: [facility];  Service: Orthopedics;  Laterality: N/A;  . BACK SURGERY    . DIAGNOSTIC LAPAROSCOPY  2004   x3  . DILATION AND EVACUATION N/A 01/28/2013   Procedure: DILATATION AND EVACUATION;  Surgeon: Princess Bruins, MD;  Location: [facility];  Service: Gynecology;  Laterality: N/A;  . KNEE ARTHROSCOPY Left    x 3  . LUMBAR LAMINECTOMY N/A 12/10/2013   Procedure: LEFT L5-S1 MICRODISCECTOMY USING MIS;  Surgeon: Jessy Oto, MD;  Location: [facility];  Service: Orthopedics;  Laterality: N/A;  . LUMBAR LAMINECTOMY N/A 12/30/2014   Procedure: LEFT L5-S1 Redo MICRODISCECTOMY;  Surgeon: Jessy Oto, MD;  Location: [facility];  Service: Orthopedics;  Laterality: N/A;  .  LUMBAR LAMINECTOMY/DECOMPRESSION MICRODISCECTOMY N/A 08/14/2018   Procedure: LEFT L5-S1 FACETECTOMY WITH SI PEDICULECTOMY AND EXPLORATION OF LEFT L5-S1 DISC;  Surgeon: Jessy Oto, MD;  Location: [facility];  Service: Orthopedics;  Laterality: N/A;  . ROBOTIC ASSISTED TOTAL HYSTERECTOMY N/A 02/09/2013   Procedure: ROBOTIC ASSISTED TOTAL HYSTERECTOMY;  Surgeon: Princess Bruins, MD;  Location: [facility];  Service: Gynecology;  Laterality: N/A;  . UNILATERAL SALPINGECTOMY Right 02/09/2013   Procedure: UNILATERAL SALPINGECTOMY;  Surgeon: Princess Bruins, MD;  Location: [facility];  Service: Gynecology;  Laterality: Right;  . WISDOM TOOTH EXTRACTION     Social History   Occupational History    . Not on file  Tobacco Use  . Smoking status: Current Every Day Smoker    Packs/day: 0.50    Years: 25.00    Pack years: 12.50    Types: Cigarettes  . Smokeless tobacco: Never Used  Substance and Sexual Activity  . Alcohol use: Yes  . Drug use: No  . Sexual activity: Not Currently    Birth control/protection: Surgical

## 2018-09-28 NOTE — Telephone Encounter (Signed)
This has been signed.

## 2018-10-01 ENCOUNTER — Other Ambulatory Visit (INDEPENDENT_AMBULATORY_CARE_PROVIDER_SITE_OTHER): Payer: Self-pay | Admitting: Specialist

## 2018-10-01 NOTE — Telephone Encounter (Signed)
Too soon for refill.

## 2018-10-01 NOTE — Telephone Encounter (Signed)
Patient called to request an rx refill and was advised to call the pharmacy.  Patient called back stating that the pharmacy will not refill her Morphine because it is an opioid and that we had to print it and give it to the patient.  CB#[phone]

## 2018-10-01 NOTE — Telephone Encounter (Signed)
Sent request to Dr. Sharol Given since Dr. Louanne Skye is out of the office

## 2018-10-02 NOTE — Telephone Encounter (Signed)
I called and advised patient that this would have to wait for nitka

## 2018-10-05 ENCOUNTER — Telehealth (INDEPENDENT_AMBULATORY_CARE_PROVIDER_SITE_OTHER): Payer: Self-pay | Admitting: Specialist

## 2018-10-05 MED ORDER — MORPHINE SULFATE 30 MG PO TABS: 15.0000 mg | ORAL_TABLET | ORAL | 0 refills | Status: DC | PRN

## 2018-10-05 NOTE — Telephone Encounter (Signed)
Patient called in regard to refill for morphine. She is out.  Please call patient to [phone]

## 2018-10-05 NOTE — Telephone Encounter (Signed)
Melinda from [facility] called and stated that orders were never received.  She is requesting to refax to Fax# [phone] Attn: Rip Harbour

## 2018-10-06 NOTE — Telephone Encounter (Signed)
This has been done and patient has been notified it is ready for pick up

## 2018-10-06 NOTE — Telephone Encounter (Signed)
Rip Harbour is going to refax the info

## 2018-10-06 NOTE — Telephone Encounter (Signed)
Pt is aware her rx is ready for pick up at the front desk 

## 2018-10-09 NOTE — Telephone Encounter (Signed)
This has been faxed back to [facility]

## 2018-10-16 ENCOUNTER — Telehealth (INDEPENDENT_AMBULATORY_CARE_PROVIDER_SITE_OTHER): Payer: Self-pay | Admitting: Specialist

## 2018-10-16 NOTE — Telephone Encounter (Signed)
[name] [name] with [facility] wanted to let Dr. Louanne Skye know patient only had 1 of her 2 scheduled visits this week because patient went out of town. Also, [name] is wondering if a prescription for a NMES unit can be sent to them so patient can get if for her home? [name] [name]'s # [phone]

## 2018-10-19 ENCOUNTER — Other Ambulatory Visit (INDEPENDENT_AMBULATORY_CARE_PROVIDER_SITE_OTHER): Payer: Self-pay | Admitting: Specialist

## 2018-10-19 NOTE — Telephone Encounter (Signed)
[name] [name] with [facility] wanted to let Dr. Louanne Skye know patient only had 1 of her 2 scheduled visits this week because patient went out of town. Also, [name] is wondering if a prescription for a NMES unit can be sent to them so patient can get if for her home?-----Please advise

## 2018-10-19 NOTE — Telephone Encounter (Signed)
Sent request to Dr. Nitka 

## 2018-10-19 NOTE — Telephone Encounter (Signed)
Medication refill °Morphine  °

## 2018-10-20 ENCOUNTER — Telehealth (INDEPENDENT_AMBULATORY_CARE_PROVIDER_SITE_OTHER): Payer: Self-pay | Admitting: Specialist

## 2018-10-20 NOTE — Telephone Encounter (Signed)
Kelly, PT at [facility] said they are discharging patient from home health physical therapy but she believes patient could benefit by continuing with outpatient pt so if we can set her up tomorrow. Also, she is wondering if you can write patient a script for a personal NMES unit? Kelly's # [phone] 

## 2018-10-21 ENCOUNTER — Ambulatory Visit (INDEPENDENT_AMBULATORY_CARE_PROVIDER_SITE_OTHER): Payer: PRIVATE HEALTH INSURANCE | Admitting: Specialist

## 2018-10-21 ENCOUNTER — Encounter (INDEPENDENT_AMBULATORY_CARE_PROVIDER_SITE_OTHER): Payer: Self-pay | Admitting: Specialist

## 2018-10-21 ENCOUNTER — Telehealth (INDEPENDENT_AMBULATORY_CARE_PROVIDER_SITE_OTHER): Payer: Self-pay | Admitting: Specialist

## 2018-10-21 ENCOUNTER — Other Ambulatory Visit (INDEPENDENT_AMBULATORY_CARE_PROVIDER_SITE_OTHER): Payer: Self-pay | Admitting: Specialist

## 2018-10-21 VITALS — BP 106/75 | HR 81 | Ht 67.0 in | Wt 160.0 lb

## 2018-10-21 DIAGNOSIS — Z9889 Other specified postprocedural states: Secondary | ICD-10-CM

## 2018-10-21 DIAGNOSIS — M21372 Foot drop, left foot: Secondary | ICD-10-CM

## 2018-10-21 DIAGNOSIS — M961 Postlaminectomy syndrome, not elsewhere classified: Secondary | ICD-10-CM

## 2018-10-21 MED ORDER — MORPHINE SULFATE 30 MG PO TABS: 15.0000 mg | ORAL_TABLET | ORAL | 0 refills | Status: DC | PRN

## 2018-10-21 NOTE — Patient Instructions (Signed)
Avoid frequent bending and stooping  No lifting greater than 10 lbs. May use ice or moist heat for pain. Weight loss is of benefit. Best medication for lumbar disc disease is arthritis medications like motrin, celebrex and naprosyn. Exercise is important to improve your indurance and does allow people to function better inspite of back pain.  I recommend that you continue with physical therapy. The pain you have is neurogenic pain. A pain management program is recommended as you do have pain and surgery doesn't seem to be helping with This condition.

## 2018-10-21 NOTE — Telephone Encounter (Signed)
Diclofenac refill request 

## 2018-10-21 NOTE — Progress Notes (Signed)
Post-Op Visit Note   Patient: [name] [name]           Date of Birth: [birth year]           MRN: [id number] Visit Date: 10/21/2018 PCP: Buzzy Han, MD   Assessment & Plan:Left leg sciatica persisting despite left L5 and S1 exploration and decompression of the L5 nerve root. She has a solid fusion L5-S1  Chief Complaint:  Chief Complaint  Patient presents with  . Lower Back - Follow-up   Visit Diagnoses:  1. Status post lumbar laminectomy   Knee reflex is normal, ankle reflex left is 1+. She has pain with SLR on the left. She complains of persistent  Balance issues with the left leg giving out on her.  Motor function is weak left foot DF and plantar flexion. She is guarding with strength testing of the knee   Plan: Avoid frequent bending and stooping  No lifting greater than 10 lbs. May use ice or moist heat for pain. Weight loss is of benefit. Best medication for lumbar disc disease is arthritis medications like motrin, celebrex and naprosyn. Exercise is important to improve your indurance and does allow people to function better inspite of back pain.    Follow-Up Instructions: No follow-ups on file.   Orders:  No orders of the defined types were placed in this encounter.  No orders of the defined types were placed in this encounter.   Imaging: No results found.  PMFS History: Patient Active Problem List   Diagnosis Date Noted  . Recurrent displacement of lumbar disc 08/14/2018    Priority: High    Class: Chronic  . S/P lumbar and lumbosacral fusion by anterior technique 10/09/2016    Priority: High    Class: Status post  . Disc disease, degenerative, lumbar or lumbosacral 08/26/2016    Priority: High    Class: Chronic  . Herniated nucleus pulposus, lumbar 12/10/2013    Priority: High  . Status post lumbar laminectomy 08/14/2018  . 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  . Right lower quadrant pain 06/06/2014  . S/P hysterectomy  02/09/2013  . Pulmonary embolism ([place])   . Protein C deficiency ([place])   . Fibroids   . Endometriosis    Past Medical History:  Diagnosis Date  . Arthritis   . Chronic back pain    stenosis and HNP  . History of pulmonary embolus (PE) 03/2008  . Pneumonia    "walking" in the 90's  . Urinary urgency   . Weakness    numbness and tingling on left side    History reviewed. No pertinent family history.  Past Surgical History:  Procedure Laterality Date  . ABDOMINAL EXPOSURE N/A 08/26/2016   Procedure: ABDOMINAL EXPOSURE;  Surgeon: Rosetta Posner, MD;  Location: [place];  Service: Vascular;  Laterality: N/A;  . ABDOMINAL HYSTERECTOMY    . ANTERIOR LUMBAR FUSION N/A 08/26/2016   Procedure: ANTERIOR LUMBAR FUSION L5-S1, EXCISION OF HERNIATED DISC LEFT L5-S1, ZERO P LUMBAR INTERBODY IMPLANT, LEFT ILIAC CREST BONE GRAFT ;  Surgeon: Jessy Oto, MD;  Location: [facility];  Service: Orthopedics;  Laterality: N/A;  . BACK SURGERY    . DIAGNOSTIC LAPAROSCOPY  2004   x3  . DILATION AND EVACUATION N/A 01/28/2013   Procedure: DILATATION AND EVACUATION;  Surgeon: Princess Bruins, MD;  Location: [facility];  Service: Gynecology;  Laterality: N/A;  . KNEE ARTHROSCOPY Left    x 3  . LUMBAR LAMINECTOMY N/A 12/10/2013  Procedure: LEFT L5-S1 MICRODISCECTOMY USING MIS;  Surgeon: Jessy Oto, MD;  Location: [place];  Service: Orthopedics;  Laterality: N/A;  . LUMBAR LAMINECTOMY N/A 12/30/2014   Procedure: LEFT L5-S1 Redo MICRODISCECTOMY;  Surgeon: Jessy Oto, MD;  Location: [facility];  Service: Orthopedics;  Laterality: N/A;  . LUMBAR LAMINECTOMY/DECOMPRESSION MICRODISCECTOMY N/A 08/14/2018   Procedure: LEFT L5-S1 FACETECTOMY WITH SI PEDICULECTOMY AND EXPLORATION OF LEFT L5-S1 DISC;  Surgeon: Jessy Oto, MD;  Location: [place];  Service: Orthopedics;  Laterality: N/A;  . ROBOTIC ASSISTED TOTAL HYSTERECTOMY N/A 02/09/2013   Procedure: ROBOTIC ASSISTED TOTAL HYSTERECTOMY;  Surgeon: Princess Bruins, MD;  Location: [facility];  Service: Gynecology;  Laterality: N/A;  . UNILATERAL SALPINGECTOMY Right 02/09/2013   Procedure: UNILATERAL SALPINGECTOMY;  Surgeon: Princess Bruins, MD;  Location: [facility];  Service: Gynecology;  Laterality: Right;  . WISDOM TOOTH EXTRACTION     Social History   Occupational History  . Not on file  Tobacco Use  . Smoking status: Current Every Day Smoker    Packs/day: 0.50    Years: 25.00    Pack years: 12.50    Types: Cigarettes  . Smokeless tobacco: Never Used  Substance and Sexual Activity  . Alcohol use: Yes  . Drug use: No  . Sexual activity: Not Currently    Birth control/protection: Surgical

## 2018-10-21 NOTE — Telephone Encounter (Signed)
Patient was told to return in 4 weeks no openings til 12/19-Please add patient to waiting/cancellation list

## 2018-10-22 NOTE — Telephone Encounter (Signed)
Claiborne Billings, PT at [facility] said they are discharging patient from home health physical therapy but she believes patient could benefit by continuing with outpatient pt so if we can set her up tomorrow. Also, she is wondering if you can write patient a script for a personal NMES unit? Kelly's # [phone]

## 2018-10-22 NOTE — Telephone Encounter (Signed)
I have put her on the cancellation list

## 2018-10-29 ENCOUNTER — Other Ambulatory Visit (INDEPENDENT_AMBULATORY_CARE_PROVIDER_SITE_OTHER): Payer: Self-pay | Admitting: Specialist

## 2018-10-29 NOTE — Telephone Encounter (Signed)
Methocarbamol refill request 

## 2018-11-02 ENCOUNTER — Other Ambulatory Visit (INDEPENDENT_AMBULATORY_CARE_PROVIDER_SITE_OTHER): Payer: Self-pay | Admitting: Specialist

## 2018-11-02 NOTE — Telephone Encounter (Signed)
Patient called requesting a refill on her Morphine.  CB#[phone]

## 2018-11-04 NOTE — Telephone Encounter (Signed)
Patient walked in to check on prescription, advised nobody was here to check with. Advised I would send another urgent message to check. Patients # [phone]

## 2018-11-05 MED ORDER — MORPHINE SULFATE 30 MG PO TABS: 15.0000 mg | ORAL_TABLET | Freq: Four times a day (QID) | ORAL | 0 refills | Status: DC | PRN

## 2018-11-05 NOTE — Telephone Encounter (Signed)
I called and advised rx was electronically sent to her pharm

## 2018-11-05 NOTE — Telephone Encounter (Signed)
Sent request to dr. Louanne Skye

## 2018-11-17 ENCOUNTER — Telehealth (INDEPENDENT_AMBULATORY_CARE_PROVIDER_SITE_OTHER): Payer: Self-pay | Admitting: Specialist

## 2018-11-17 ENCOUNTER — Other Ambulatory Visit (INDEPENDENT_AMBULATORY_CARE_PROVIDER_SITE_OTHER): Payer: Self-pay | Admitting: Specialist

## 2018-11-17 MED ORDER — MORPHINE SULFATE 15 MG PO TABS: 15.0000 mg | ORAL_TABLET | Freq: Three times a day (TID) | ORAL | 0 refills | Status: DC

## 2018-11-17 NOTE — Telephone Encounter (Signed)
Will continue to wean from [facility], prescribed MSIR 15 mg tablets one every 8 hours.

## 2018-11-17 NOTE — Telephone Encounter (Signed)
Patient called requesting morphine.  Please call patient to advise

## 2018-11-19 NOTE — Telephone Encounter (Signed)
Pt.notified

## 2018-11-23 ENCOUNTER — Ambulatory Visit (INDEPENDENT_AMBULATORY_CARE_PROVIDER_SITE_OTHER): Payer: PRIVATE HEALTH INSURANCE | Admitting: Specialist

## 2018-11-23 ENCOUNTER — Ambulatory Visit (INDEPENDENT_AMBULATORY_CARE_PROVIDER_SITE_OTHER): Payer: Self-pay

## 2018-11-23 ENCOUNTER — Encounter (INDEPENDENT_AMBULATORY_CARE_PROVIDER_SITE_OTHER): Payer: Self-pay | Admitting: Specialist

## 2018-11-23 VITALS — BP 108/75 | HR 70 | Ht 67.0 in | Wt 160.0 lb

## 2018-11-23 DIAGNOSIS — Z9889 Other specified postprocedural states: Secondary | ICD-10-CM

## 2018-11-23 DIAGNOSIS — M961 Postlaminectomy syndrome, not elsewhere classified: Secondary | ICD-10-CM | POA: Diagnosis not present

## 2018-11-23 MED ORDER — PREGABALIN 75 MG PO CAPS: 75.0000 mg | ORAL_CAPSULE | Freq: Two times a day (BID) | ORAL | 6 refills | Status: AC

## 2018-11-23 MED ORDER — MORPHINE SULFATE 15 MG PO TABS: 15.0000 mg | ORAL_TABLET | Freq: Four times a day (QID) | ORAL | 0 refills | Status: DC | PRN

## 2018-11-23 NOTE — Progress Notes (Signed)
Office Visit Note   Patient: [name] [name]           Date of Birth: 04/23/1971           MRN: [id number] Visit Date: 11/23/2018              Requested by: Buzzy Han, MD [place], [place] [address] PCP: Buzzy Han, MD   Assessment & Plan: Visit Diagnoses:  1. Status post lumbar laminectomy     Plan: Avoid frequent bending and stooping  No lifting greater than 10 lbs. May use ice or moist heat for pain. Weight loss is of benefit. Best medication for lumbar disc disease is arthritis medications like motrin, celebrex and naprosyn. Exercise is important to improve your indurance and does allow people to function better inspite of back pain. Need to work to decrease use of narcotics gradually weaning from these medications. I can not continue to provide this narcotic and a pain management program is recommended.  Lyrica 75 mg poBID.   Follow-Up Instructions: Return in about 3 weeks (around 12/14/2018).   Orders:  Orders Placed This Encounter  Procedures  . XR Lumbar Spine 2-3 Views   Meds ordered this encounter  Medications  . morphine (MSIR) 15 MG tablet    Sig: Take 1 tablet (15 mg total) by mouth every 6 (six) hours as needed for up to 7 days for severe pain. We are weaning this medication and will stop it.    Dispense:  28 tablet    Refill:  0      Procedures: No procedures performed   Clinical Data: No additional findings.   Subjective: Chief Complaint  Patient presents with  . Lower Back - Follow-up    47 year old female with history of back pain and left leg sciatica. She has had Left L5-S1 microdiscectomy x2 and then ALF in 08/26/2016. Most recently A reexploration left L5-S1 for recurrent HNP, found to have severe left L5 root entrapment not significant S1 compression though the left L5-S1 disc was exposed via a medial pediculectomy of S1. 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 Today she reports that she is unable to decrease her use of the MSIR to every 8 hours and is at about 15 mg every 5 hours. She does not have a AFO and with [facility] she might be able to obtain one through the hospital.    Review of Systems   Objective: Vital Signs: BP 108/75 (BP Location: Left Arm, Patient Position: Sitting)   Pulse 70   Ht 5\' 7"  (1.702 m)   Wt 160 lb (72.6 kg)   BMI 25.06 kg/m   Physical Exam  Constitutional: She is oriented to person, place, and time. She appears well-developed and well-nourished.  HENT:  Head: Normocephalic and atraumatic.  Eyes: Pupils are equal, round, and reactive to light. EOM are normal.  Neck: Normal range of motion. Neck supple.  Pulmonary/Chest: Effort normal and breath sounds normal.  Abdominal: Soft. Bowel sounds are normal.  Neurological: She is alert and oriented to person, place, and time.  Skin: Skin is warm and dry.  Psychiatric: She has a normal mood and affect. Her behavior is normal. Judgment and thought content normal.    Back Exam   Tenderness  The patient is experiencing tenderness in the lumbar.  Range of Motion  Extension: abnormal  Flexion: normal  Lateral bend right: abnormal  Lateral bend  left: abnormal  Rotation right: abnormal  Rotation left: abnormal   Muscle Strength  Right Quadriceps:  5/5  Left Quadriceps:  5/5  Right Hamstrings:  5/5  Left Hamstrings:  5/5   Tests  Straight leg raise right: negative Straight leg raise left: negative  Reflexes  Patellar: 2/4 Achilles: 0/4 Babinski's sign: normal   Other  Toe walk: normal Heel walk: normal Sensation: normal Gait: normal  Erythema: no back redness Scars: present  Comments:  SLR is negative, she gives away and gives poor effort in raising the left foot.       Specialty Comments:  No specialty comments available.  Imaging: No results found.   PMFS History: Patient Active Problem List   Diagnosis Date  Noted  . Recurrent displacement of lumbar disc 08/14/2018    Priority: High    Class: Chronic  . S/P lumbar and lumbosacral fusion by anterior technique 10/09/2016    Priority: High    Class: Status post  . Disc disease, degenerative, lumbar or lumbosacral 08/26/2016    Priority: High    Class: Chronic  . Herniated nucleus pulposus, lumbar 12/10/2013    Priority: High  . Status post lumbar laminectomy 08/14/2018  . 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  . Right lower quadrant pain 06/06/2014  . S/P hysterectomy 02/09/2013  . Pulmonary embolism ([place])   . Protein C deficiency ([place])   . Fibroids   . Endometriosis    Past Medical History:  Diagnosis Date  . Arthritis   . Chronic back pain    stenosis and HNP  . History of pulmonary embolus (PE) 03/2008  . Pneumonia    "walking" in the 90's  . Urinary urgency   . Weakness    numbness and tingling on left side    History reviewed. No pertinent family history.  Past Surgical History:  Procedure Laterality Date  . ABDOMINAL EXPOSURE N/A 08/26/2016   Procedure: ABDOMINAL EXPOSURE;  Surgeon: Rosetta Posner, MD;  Location: [place];  Service: Vascular;  Laterality: N/A;  . ABDOMINAL HYSTERECTOMY    . ANTERIOR LUMBAR FUSION N/A 08/26/2016   Procedure: ANTERIOR LUMBAR FUSION L5-S1, EXCISION OF HERNIATED DISC LEFT L5-S1, ZERO P LUMBAR INTERBODY IMPLANT, LEFT ILIAC CREST BONE GRAFT ;  Surgeon: Jessy Oto, MD;  Location: [place];  Service: Orthopedics;  Laterality: N/A;  . BACK SURGERY    . DIAGNOSTIC LAPAROSCOPY  2004   x3  . DILATION AND EVACUATION N/A 01/28/2013   Procedure: DILATATION AND EVACUATION;  Surgeon: Princess Bruins, MD;  Location: [facility];  Service: Gynecology;  Laterality: N/A;  . KNEE ARTHROSCOPY Left    x 3  . LUMBAR LAMINECTOMY N/A 12/10/2013   Procedure: LEFT L5-S1 MICRODISCECTOMY USING MIS;  Surgeon: Jessy Oto, MD;  Location: [place];  Service: Orthopedics;  Laterality: N/A;  . LUMBAR LAMINECTOMY N/A  12/30/2014   Procedure: LEFT L5-S1 Redo MICRODISCECTOMY;  Surgeon: Jessy Oto, MD;  Location: [place];  Service: Orthopedics;  Laterality: N/A;  . LUMBAR LAMINECTOMY/DECOMPRESSION MICRODISCECTOMY N/A 08/14/2018   Procedure: LEFT L5-S1 FACETECTOMY WITH SI PEDICULECTOMY AND EXPLORATION OF LEFT L5-S1 DISC;  Surgeon: Jessy Oto, MD;  Location: [place];  Service: Orthopedics;  Laterality: N/A;  . ROBOTIC ASSISTED TOTAL HYSTERECTOMY N/A 02/09/2013   Procedure: ROBOTIC ASSISTED TOTAL HYSTERECTOMY;  Surgeon: Princess Bruins, MD;  Location: [facility];  Service: Gynecology;  Laterality: N/A;  . UNILATERAL SALPINGECTOMY Right 02/09/2013   Procedure: UNILATERAL SALPINGECTOMY;  Surgeon: Princess Bruins, MD;  Location: [facility];  Service: Gynecology;  Laterality: Right;  . WISDOM TOOTH EXTRACTION     Social History   Occupational History  . Not on file  Tobacco Use  . Smoking status: Current Every Day Smoker    Packs/day: 0.50    Years: 25.00    Pack years: 12.50    Types: Cigarettes  . Smokeless tobacco: Never Used  Substance and Sexual Activity  . Alcohol use: Yes  . Drug use: No  . Sexual activity: Not Currently    Birth control/protection: Surgical

## 2018-11-23 NOTE — Patient Instructions (Addendum)
Avoid frequent bending and stooping  No lifting greater than 10 lbs. May use ice or moist heat for pain. Weight loss is of benefit. Best medication for lumbar disc disease is arthritis medications like motrin, celebrex and naprosyn. Exercise is important to improve your indurance and does allow people to function better inspite of back pain. Need to work to decrease use of narcotics gradually weaning from these medications. I can not continue to provide this narcotic and a pain management program is recommended.  Lyrica 75 mg poBID.

## 2018-12-01 ENCOUNTER — Telehealth (INDEPENDENT_AMBULATORY_CARE_PROVIDER_SITE_OTHER): Payer: Self-pay

## 2018-12-01 ENCOUNTER — Telehealth (INDEPENDENT_AMBULATORY_CARE_PROVIDER_SITE_OTHER): Payer: Self-pay | Admitting: Specialist

## 2018-12-01 ENCOUNTER — Other Ambulatory Visit (INDEPENDENT_AMBULATORY_CARE_PROVIDER_SITE_OTHER): Payer: Self-pay | Admitting: Specialist

## 2018-12-01 MED ORDER — MORPHINE SULFATE 15 MG PO TABS: 15.0000 mg | ORAL_TABLET | Freq: Four times a day (QID) | ORAL | 0 refills | Status: DC | PRN

## 2018-12-01 NOTE — Telephone Encounter (Signed)
Patient called needing Rx refilled (Morphine) The number to contact patient is [phone] °

## 2018-12-01 NOTE — Telephone Encounter (Signed)
Sent request to Dr. Nitka 

## 2018-12-01 NOTE — Telephone Encounter (Addendum)
Jake with the [facility] called stating that patient's last office note needs to be faxed with authorization for a referral to pain clinic to [phone].  Cb# is [phone].  Please advise.  Thank you.

## 2018-12-02 ENCOUNTER — Other Ambulatory Visit (INDEPENDENT_AMBULATORY_CARE_PROVIDER_SITE_OTHER): Payer: Self-pay | Admitting: Specialist

## 2018-12-02 NOTE — Telephone Encounter (Signed)
I faxed the requested note to [facility]

## 2018-12-03 ENCOUNTER — Ambulatory Visit (INDEPENDENT_AMBULATORY_CARE_PROVIDER_SITE_OTHER): Payer: PRIVATE HEALTH INSURANCE | Admitting: Specialist

## 2018-12-03 NOTE — Telephone Encounter (Signed)
Methocarbamol refill request 

## 2018-12-04 ENCOUNTER — Other Ambulatory Visit (INDEPENDENT_AMBULATORY_CARE_PROVIDER_SITE_OTHER): Payer: Self-pay | Admitting: Specialist

## 2018-12-04 MED ORDER — MORPHINE SULFATE 15 MG PO TABS: 15.0000 mg | ORAL_TABLET | Freq: Four times a day (QID) | ORAL | 0 refills | Status: DC | PRN

## 2018-12-04 NOTE — Telephone Encounter (Signed)
Patient called stating that she went to the Pharmacy yesterday and the pharmacy did not have the RX for her Morphine.   CB#[phone]

## 2018-12-04 NOTE — Telephone Encounter (Signed)
Patients partner came by and was advised that the meds were resent to her pharmacy

## 2018-12-10 ENCOUNTER — Other Ambulatory Visit (INDEPENDENT_AMBULATORY_CARE_PROVIDER_SITE_OTHER): Payer: Self-pay | Admitting: Orthopedic Surgery

## 2018-12-10 MED ORDER — MORPHINE SULFATE 15 MG PO TABS: 15.0000 mg | ORAL_TABLET | Freq: Four times a day (QID) | ORAL | 0 refills | Status: DC | PRN

## 2018-12-10 NOTE — Addendum Note (Signed)
Addended by: Minda Ditto, Geoffery Spruce on: 12/10/2018 12:34 PM   Modules accepted: Orders

## 2018-12-10 NOTE — Telephone Encounter (Signed)
I called and advised that Dr. Ninfa Linden did refill her meds and that Dr. Louanne Skye was going to be out of the office for a while and that I was not sure how long the other Drs would refill this medication at this point.  She states that she is going for a urine drug screen for pain management on 12/21/18 and should be in within 2-3 weeks following that.  She states that hopefully she can get it till then.

## 2018-12-10 NOTE — Telephone Encounter (Signed)
[name] [name] called in to request a refill of Morphine.  She would also like to speak with [name] to update her on her pain management status.  Please call [phone].

## 2018-12-10 NOTE — Telephone Encounter (Signed)
Patient seen 11/23/18 by Dr Louanne Skye. No action needed.

## 2018-12-17 ENCOUNTER — Other Ambulatory Visit (INDEPENDENT_AMBULATORY_CARE_PROVIDER_SITE_OTHER): Payer: Self-pay | Admitting: Specialist

## 2018-12-17 NOTE — Telephone Encounter (Signed)
Sent request to Dr. Sharol Given

## 2018-12-17 NOTE — Telephone Encounter (Signed)
Should not need this any more

## 2018-12-17 NOTE — Telephone Encounter (Signed)
Rx refill Morphine 15mg 

## 2018-12-18 ENCOUNTER — Telehealth (INDEPENDENT_AMBULATORY_CARE_PROVIDER_SITE_OTHER): Payer: Self-pay | Admitting: Specialist

## 2018-12-18 NOTE — Telephone Encounter (Signed)
IC and s/w patient and she says that she needs something to avoid the n/v and jerking that comes when she stops taking this med.  She is pending pain management, and goes for a urine screen next week, and the [place] rep says they should have her in soon to see the pain doctor. She will not need Dr Louanne Skye to prescribe this medication.   Can you possible give her something if not the requested med?   I have discussed with her the precaution needed when taking meds, and that this is a high dose narcotic, and that will taking this high dose, her heart could stop. She understands.  Requests something to help her with her pain.  I offered appointment Monday, but that is when she goes for her urine test for pain management.  Can you advise any further?

## 2018-12-18 NOTE — Telephone Encounter (Signed)
Holding for you.  I s/w patient and she mentioned calling courthouse to tell them JN was out of office.  You can address Monday 12/21/17 if you want with another provider if patient still needs this.  See also other note regarding meds as FYI to you, thanks.

## 2018-12-18 NOTE — Telephone Encounter (Signed)
Patient called she needs a note for jury duty unable to attend. [name] [name] duty is scheduled for the 7th of January. Patient will call the courthouse to let them know Dr.Nitka is out of the office. Patient also needs a letter covalence amount from surgery.

## 2018-12-18 NOTE — Telephone Encounter (Signed)
prescribe percocet 5/325 #30, 1 PO TID PRN pain

## 2018-12-21 ENCOUNTER — Other Ambulatory Visit (INDEPENDENT_AMBULATORY_CARE_PROVIDER_SITE_OTHER): Payer: Self-pay | Admitting: Orthopedic Surgery

## 2018-12-21 MED ORDER — OXYCODONE-ACETAMINOPHEN 5-325 MG PO TABS: 1.0000 | ORAL_TABLET | Freq: Three times a day (TID) | ORAL | 0 refills | Status: DC | PRN

## 2018-12-21 NOTE — Telephone Encounter (Signed)
Thank you :)

## 2018-12-21 NOTE — Telephone Encounter (Signed)
Thank you for processing this one-

## 2018-12-21 NOTE — Telephone Encounter (Signed)
rx written

## 2018-12-21 NOTE — Telephone Encounter (Signed)
Dr Sharol Given please prescribe for patient. Thank you

## 2018-12-21 NOTE — Telephone Encounter (Signed)
Called and advised pt that rx is at the front desk for pick up.

## 2018-12-22 NOTE — Telephone Encounter (Signed)
lmom for her to call me back

## 2018-12-25 ENCOUNTER — Other Ambulatory Visit (INDEPENDENT_AMBULATORY_CARE_PROVIDER_SITE_OTHER): Payer: Self-pay | Admitting: Specialist

## 2018-12-25 NOTE — Telephone Encounter (Signed)
Can you advise? I know that last time you gave her Percocet. She states that she will be out this weekend.

## 2018-12-25 NOTE — Telephone Encounter (Signed)
No refill, she just got 20 percocet 4 days ago

## 2018-12-25 NOTE — Telephone Encounter (Signed)
I called and advised pt that this will be addressed when he comes in on Monday.

## 2018-12-25 NOTE — Telephone Encounter (Signed)
Pt is calling to get a refill on her morphine

## 2018-12-25 NOTE — Telephone Encounter (Signed)
I called and she will have her mom to drop off the forms on Monday

## 2018-12-28 MED ORDER — MORPHINE SULFATE 15 MG PO TABS: 15.0000 mg | ORAL_TABLET | Freq: Four times a day (QID) | ORAL | 0 refills | Status: DC | PRN

## 2018-12-31 ENCOUNTER — Other Ambulatory Visit (INDEPENDENT_AMBULATORY_CARE_PROVIDER_SITE_OTHER): Payer: Self-pay | Admitting: Specialist

## 2019-01-01 NOTE — Telephone Encounter (Signed)
Methocarbamol refill request 

## 2019-01-04 ENCOUNTER — Other Ambulatory Visit (INDEPENDENT_AMBULATORY_CARE_PROVIDER_SITE_OTHER): Payer: Self-pay | Admitting: Specialist

## 2019-01-04 NOTE — Telephone Encounter (Signed)
Patient called needing Rx refilled (Morphine) Patient also asked if [name] [name] will give her a call when she get a chance. The number to contact patient is [phone]

## 2019-01-05 ENCOUNTER — Other Ambulatory Visit (INDEPENDENT_AMBULATORY_CARE_PROVIDER_SITE_OTHER): Payer: Self-pay | Admitting: Radiology

## 2019-01-05 ENCOUNTER — Telehealth (INDEPENDENT_AMBULATORY_CARE_PROVIDER_SITE_OTHER): Payer: Self-pay | Admitting: Radiology

## 2019-01-05 MED ORDER — MORPHINE SULFATE 15 MG PO TABS: 15.0000 mg | ORAL_TABLET | Freq: Four times a day (QID) | ORAL | 0 refills | Status: DC | PRN

## 2019-01-05 NOTE — Telephone Encounter (Signed)
Patient would like to make sure that the OV notes state somewhere that she 1- did not get any relief from the last surgery and that 2- when she had surgery with Dr. Donnetta Hutching and he moved all her organs that since that time she has to wear depends due to bladder issues. And this is for pain management admission.

## 2019-01-11 ENCOUNTER — Other Ambulatory Visit (INDEPENDENT_AMBULATORY_CARE_PROVIDER_SITE_OTHER): Payer: Self-pay | Admitting: Specialist

## 2019-01-11 NOTE — Telephone Encounter (Signed)
Patient called requesting refill of Morphine.  Please call patient to advise [phone]

## 2019-01-13 ENCOUNTER — Encounter (INDEPENDENT_AMBULATORY_CARE_PROVIDER_SITE_OTHER): Payer: Self-pay | Admitting: Specialist

## 2019-01-13 NOTE — Telephone Encounter (Signed)
Pt has called again requesting refill of Morphine wanting to know if it can filled ASAP. States that she ran out yesterday.

## 2019-01-14 ENCOUNTER — Telehealth (INDEPENDENT_AMBULATORY_CARE_PROVIDER_SITE_OTHER): Payer: Self-pay | Admitting: Specialist

## 2019-01-14 MED ORDER — MORPHINE SULFATE 15 MG PO TABS: 15.0000 mg | ORAL_TABLET | Freq: Four times a day (QID) | ORAL | 0 refills | Status: DC | PRN

## 2019-01-14 NOTE — Telephone Encounter (Signed)
This patient called and very upset no one has called her or filled her RX morphine.  States she is out and really needs it. Her son is in hospital and she is in pain herself.  Please call her as she is  Fussy over the fact no one has acknowledged her calls and requests.

## 2019-01-15 NOTE — Telephone Encounter (Signed)
I spoke with pt yesterday and advised that her message was sent to Dr. Louanne Skye and that we're waiting on him.

## 2019-01-15 NOTE — Telephone Encounter (Signed)
I have spoken with the patient several times and all I can let her know is that we are waiting on Dr. Louanne Skye to advise on the refill of her meds.  She has since got the refill

## 2019-01-21 ENCOUNTER — Other Ambulatory Visit (INDEPENDENT_AMBULATORY_CARE_PROVIDER_SITE_OTHER): Payer: Self-pay | Admitting: Specialist

## 2019-01-21 NOTE — Telephone Encounter (Signed)
Patient called requesting an RX refill on her Morphine.  Patient uses [facility] on [address]  CB#[phone].  Thank you.

## 2019-01-21 NOTE — Telephone Encounter (Signed)
Sent request to Dr. Nitka 

## 2019-01-25 MED ORDER — MORPHINE SULFATE 15 MG PO TABS: 15.0000 mg | ORAL_TABLET | Freq: Four times a day (QID) | ORAL | 0 refills | Status: DC | PRN

## 2019-02-01 ENCOUNTER — Other Ambulatory Visit (INDEPENDENT_AMBULATORY_CARE_PROVIDER_SITE_OTHER): Payer: Self-pay | Admitting: Specialist

## 2019-02-01 MED ORDER — MORPHINE SULFATE 15 MG PO TABS: 15.0000 mg | ORAL_TABLET | Freq: Four times a day (QID) | ORAL | 0 refills | Status: DC | PRN

## 2019-02-01 NOTE — Telephone Encounter (Signed)
Sent to Dr. Nitka ° ° °

## 2019-02-01 NOTE — Telephone Encounter (Signed)
Medication refill Morphine

## 2019-02-02 ENCOUNTER — Other Ambulatory Visit (INDEPENDENT_AMBULATORY_CARE_PROVIDER_SITE_OTHER): Payer: Self-pay | Admitting: Specialist

## 2019-02-02 NOTE — Telephone Encounter (Signed)
Methocarbamol refill request 

## 2019-02-03 ENCOUNTER — Telehealth (INDEPENDENT_AMBULATORY_CARE_PROVIDER_SITE_OTHER): Payer: Self-pay | Admitting: Specialist

## 2019-02-03 NOTE — Telephone Encounter (Signed)
I called and spoke with patient and she states that pain management Dr needs a form completed that states that she was given percocet by a different Dr. While Dr Louanne Skye was out of the office.  I advised that I would complete the form and send back to them once I had it completed.

## 2019-02-03 NOTE — Telephone Encounter (Signed)
See other message

## 2019-02-03 NOTE — Telephone Encounter (Signed)
Patient called and stated that it is extremely important that you return her call asap. Stated couldn't  Leave detailed message just call her.

## 2019-02-03 NOTE — Telephone Encounter (Signed)
Patient called left voicemail message asking for a call back concerning being referred for pain management. The number to contact patient is [phone]

## 2019-02-08 ENCOUNTER — Other Ambulatory Visit (INDEPENDENT_AMBULATORY_CARE_PROVIDER_SITE_OTHER): Payer: Self-pay | Admitting: Specialist

## 2019-02-08 MED ORDER — MORPHINE SULFATE 15 MG PO TABS: 15.0000 mg | ORAL_TABLET | Freq: Four times a day (QID) | ORAL | 0 refills | Status: DC | PRN

## 2019-02-08 NOTE — Telephone Encounter (Signed)
Patient called requesting morphine.  Please call patient back [phone]

## 2019-02-08 NOTE — Telephone Encounter (Signed)
Sent request to Dr. Nitka 

## 2019-02-10 ENCOUNTER — Telehealth (INDEPENDENT_AMBULATORY_CARE_PROVIDER_SITE_OTHER): Payer: Self-pay

## 2019-02-10 NOTE — Telephone Encounter (Signed)
vm left on the triage phone. Pt has been being prescribed Morphine by Dr. Louanne Skye but in his absence in January pt was prescribed Oxycodone by Dr. Sharol Given. They need something sent to them explaining why this was done because they are trying to get patient in pain management and need the documentation from [place] explaining why this was changed so she will not have any problems. Michela Pitcher this could be emailed to her.

## 2019-02-12 ENCOUNTER — Encounter (INDEPENDENT_AMBULATORY_CARE_PROVIDER_SITE_OTHER): Payer: Self-pay | Admitting: Radiology

## 2019-02-12 NOTE — Telephone Encounter (Signed)
I sent note to [facility] via email [email or web address]

## 2019-02-16 ENCOUNTER — Other Ambulatory Visit (INDEPENDENT_AMBULATORY_CARE_PROVIDER_SITE_OTHER): Payer: Self-pay | Admitting: Specialist

## 2019-02-16 NOTE — Telephone Encounter (Signed)
Sent request to Dr. Nitka 

## 2019-02-16 NOTE — Telephone Encounter (Signed)
Patient left a message yesterday requesting an RX refill on her Morphine.  CB#[phone].  Thank you

## 2019-02-17 ENCOUNTER — Other Ambulatory Visit (INDEPENDENT_AMBULATORY_CARE_PROVIDER_SITE_OTHER): Payer: Self-pay | Admitting: Specialist

## 2019-02-17 MED ORDER — MORPHINE SULFATE 15 MG PO TABS: ORAL_TABLET | ORAL | 0 refills | Status: DC

## 2019-02-24 ENCOUNTER — Ambulatory Visit (INDEPENDENT_AMBULATORY_CARE_PROVIDER_SITE_OTHER): Payer: PRIVATE HEALTH INSURANCE | Admitting: Specialist

## 2019-02-24 ENCOUNTER — Other Ambulatory Visit: Payer: Self-pay

## 2019-02-24 ENCOUNTER — Encounter (INDEPENDENT_AMBULATORY_CARE_PROVIDER_SITE_OTHER): Payer: Self-pay | Admitting: Specialist

## 2019-02-24 VITALS — BP 114/81 | HR 85 | Ht 67.0 in | Wt 160.0 lb

## 2019-02-24 DIAGNOSIS — M961 Postlaminectomy syndrome, not elsewhere classified: Secondary | ICD-10-CM

## 2019-02-24 DIAGNOSIS — Z981 Arthrodesis status: Secondary | ICD-10-CM | POA: Diagnosis not present

## 2019-02-24 MED ORDER — MORPHINE SULFATE 15 MG PO TABS: ORAL_TABLET | ORAL | 0 refills | Status: DC

## 2019-02-24 NOTE — Progress Notes (Signed)
Office Visit Note   Patient: [name] [name]           Date of Birth: [birth year]           MRN: [id number] Visit Date: 02/24/2019              Requested by: Buzzy Han, MD [place], [place] [address] PCP: Buzzy Han, MD   Assessment & Plan: Visit Diagnoses:  1. Lumbar post-laminectomy syndrome   2. Status post laminectomy with spinal fusion     Plan:Avoid frequent bending and stooping  No lifting greater than 10 lbs. May use ice or moist heat for pain. Weight loss is of benefit. Best medication for lumbar disc disease is arthritis medications like motrin, celebrex and naprosyn. Exercise is important to improve your indurance and does allow people to function better inspite of back pain. I have no further surgical solution to offer you at this point and will follow you with an appointment in 6 months. Continues on MSIR 15 mg every 8 hours. She is to enter into a chronic pain management program through the [facility].  Follow-Up Instructions: Return in about 6 months (around 08/27/2019).   Orders:  No orders of the defined types were placed in this encounter.  Meds ordered this encounter  Medications  . morphine (MSIR) 15 MG tablet    Sig: Take one tablet po every 8 hours We are weaning this medication and will stop it    Dispense:  21 tablet    Refill:  0      Procedures: No procedures performed   Clinical Data: No additional findings.   Subjective: Chief Complaint  Patient presents with  . 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 Her last surgery was 08/14/2018. She is being seen also at the [facility] and is to be entered into the pain management program there. No bowel difficulty. She had  to use a depends since the anterior lumbar discectomy and fusion surgery. 1-10 scale she indicates that she is presently an "8".    Review of Systems  Constitutional: Negative.  Negative for activity change, appetite change, chills, diaphoresis, fatigue, fever and unexpected weight change.  HENT: Negative.  Negative for congestion, dental problem, drooling, ear discharge, ear pain, facial swelling, hearing loss, mouth sores, nosebleeds, postnasal drip, rhinorrhea, sinus pressure, sinus pain, sneezing, sore throat, tinnitus, trouble swallowing and voice change.   Eyes: Negative.  Negative for photophobia, pain, discharge, redness, itching and visual disturbance.  Respiratory: Negative.  Negative for apnea, cough, choking, chest tightness, shortness of breath, wheezing and stridor.   Cardiovascular: Negative.  Negative for chest pain, palpitations and leg swelling.  Gastrointestinal: Negative.  Negative for abdominal distention, abdominal pain, anal bleeding, blood in stool, constipation, diarrhea, nausea, rectal pain and vomiting.  Endocrine: Negative.  Negative for cold intolerance, heat intolerance, polydipsia, polyphagia and polyuria.  Genitourinary: Positive for frequency and urgency. Negative for decreased urine volume, difficulty urinating, dyspareunia, dysuria, enuresis, flank pain, genital sores, hematuria, menstrual problem and pelvic pain.  Musculoskeletal: Negative.  Negative for arthralgias, back pain, gait problem, joint swelling, myalgias, neck pain and neck stiffness.  Skin: Negative.  Negative for color change, pallor, rash and wound.  Allergic/Immunologic: Negative.  Negative for environmental allergies,  food allergies and immunocompromised state.  Neurological: Positive for weakness and numbness. Negative for dizziness, tremors, seizures, syncope, facial asymmetry, speech difficulty, light-headedness and headaches.  Hematological: Negative.  Negative for adenopathy. Does not  bruise/bleed easily.  Psychiatric/Behavioral: Negative.  Negative for agitation, behavioral problems, confusion, decreased concentration, dysphoric mood, hallucinations, self-injury, sleep disturbance and suicidal ideas. The patient is not nervous/anxious and is not hyperactive.      Objective: Vital Signs: BP 114/81 (BP Location: Left Arm, Patient Position: Sitting)   Pulse 85   Ht 5\' 7"  (1.702 m)   Wt 160 lb (72.6 kg)   BMI 25.06 kg/m   Physical Exam Constitutional:      Appearance: She is well-developed.  HENT:     Head: Normocephalic and atraumatic.  Eyes:     Pupils: Pupils are equal, round, and reactive to light.  Neck:     Musculoskeletal: Normal range of motion and neck supple.  Pulmonary:     Effort: Pulmonary effort is normal.     Breath sounds: Normal breath sounds.  Abdominal:     General: Bowel sounds are normal.     Palpations: Abdomen is soft.  Skin:    General: Skin is warm and dry.  Neurological:     Mental Status: She is alert and oriented to person, place, and time.  Psychiatric:        Behavior: Behavior normal.        Thought Content: Thought content normal.        Judgment: Judgment normal.     Back Exam   Tenderness  The patient is experiencing tenderness in the lumbar.  Range of Motion  Extension: abnormal  Flexion: abnormal  Lateral bend right: abnormal  Lateral bend left: abnormal  Rotation right: abnormal  Rotation left: abnormal   Muscle Strength  Right Quadriceps:  5/5  Left Quadriceps:  5/5  Right Hamstrings:  5/5  Left Hamstrings:  5/5   Tests  Straight leg raise right: negative Straight leg raise left: negative  Reflexes  Patellar: 2/4 Achilles: 0/4 Babinski's sign: normal   Other  Toe walk: normal Heel walk: normal Sensation: decreased Gait: drop-foot  Erythema: no back redness Scars: present  Comments:  LEft ankle reflex is trace, right is 2 plus, SLR is negative, she has a left foot drop with weakness left  foot DF 2/5.      Specialty Comments:  No specialty comments available.  Imaging: No results found.   PMFS History: Patient Active Problem List   Diagnosis Date Noted  . Recurrent displacement of lumbar disc 08/14/2018    Priority: High    Class: Chronic  . S/P lumbar and lumbosacral fusion by anterior technique 10/09/2016    Priority: High    Class: Status post  . Disc disease, degenerative, lumbar or lumbosacral 08/26/2016    Priority: High    Class: Chronic  . Herniated nucleus pulposus, lumbar 12/10/2013    Priority: High  . Status post lumbar laminectomy 08/14/2018  . 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  . Right lower quadrant pain 06/06/2014  . S/P hysterectomy 02/09/2013  . Pulmonary embolism ([place])   . Protein C deficiency ([place])   . Fibroids   . Endometriosis    Past Medical History:  Diagnosis Date  . Arthritis   . Chronic back pain    stenosis and HNP  . History of pulmonary embolus (PE) 03/2008  . Pneumonia    "walking" in the 90's  . Urinary urgency   .  Weakness    numbness and tingling on left side    No family history on file.  Past Surgical History:  Procedure Laterality Date  . ABDOMINAL EXPOSURE N/A 08/26/2016   Procedure: ABDOMINAL EXPOSURE;  Surgeon: Rosetta Posner, MD;  Location: [place];  Service: Vascular;  Laterality: N/A;  . ABDOMINAL HYSTERECTOMY    . ANTERIOR LUMBAR FUSION N/A 08/26/2016   Procedure: ANTERIOR LUMBAR FUSION L5-S1, EXCISION OF HERNIATED DISC LEFT L5-S1, ZERO P LUMBAR INTERBODY IMPLANT, LEFT ILIAC CREST BONE GRAFT ;  Surgeon: Jessy Oto, MD;  Location: [place];  Service: Orthopedics;  Laterality: N/A;  . BACK SURGERY    . DIAGNOSTIC LAPAROSCOPY  2004   x3  . DILATION AND EVACUATION N/A 01/28/2013   Procedure: DILATATION AND EVACUATION;  Surgeon: Princess Bruins, MD;  Location: [facility];  Service: Gynecology;  Laterality: N/A;  . KNEE ARTHROSCOPY Left    x 3  . LUMBAR LAMINECTOMY N/A 12/10/2013   Procedure:  LEFT L5-S1 MICRODISCECTOMY USING MIS;  Surgeon: Jessy Oto, MD;  Location: [place];  Service: Orthopedics;  Laterality: N/A;  . LUMBAR LAMINECTOMY N/A 12/30/2014   Procedure: LEFT L5-S1 Redo MICRODISCECTOMY;  Surgeon: Jessy Oto, MD;  Location: [place];  Service: Orthopedics;  Laterality: N/A;  . LUMBAR LAMINECTOMY/DECOMPRESSION MICRODISCECTOMY N/A 08/14/2018   Procedure: LEFT L5-S1 FACETECTOMY WITH SI PEDICULECTOMY AND EXPLORATION OF LEFT L5-S1 DISC;  Surgeon: Jessy Oto, MD;  Location: [place];  Service: Orthopedics;  Laterality: N/A;  . ROBOTIC ASSISTED TOTAL HYSTERECTOMY N/A 02/09/2013   Procedure: ROBOTIC ASSISTED TOTAL HYSTERECTOMY;  Surgeon: Princess Bruins, MD;  Location: [facility];  Service: Gynecology;  Laterality: N/A;  . UNILATERAL SALPINGECTOMY Right 02/09/2013   Procedure: UNILATERAL SALPINGECTOMY;  Surgeon: Princess Bruins, MD;  Location: [facility];  Service: Gynecology;  Laterality: Right;  . WISDOM TOOTH EXTRACTION     Social History   Occupational History  . Not on file  Tobacco Use  . Smoking status: Current Every Day Smoker    Packs/day: 0.50    Years: 25.00    Pack years: 12.50    Types: Cigarettes  . Smokeless tobacco: Never Used  Substance and Sexual Activity  . Alcohol use: Yes  . Drug use: No  . Sexual activity: Not Currently    Birth control/protection: Surgical

## 2019-02-24 NOTE — Patient Instructions (Addendum)
Avoid frequent bending and stooping  No lifting greater than 10 lbs. May use ice or moist heat for pain. Weight loss is of benefit. Best medication for lumbar disc disease is arthritis medications like motrin, celebrex and naprosyn. Exercise is important to improve your indurance and does allow people to function better inspite of back pain. I have no further surgical solution to offer you at this point and will follow you with an appointment in 6 months. Continues on MSIR 15 mg every 8 hours. She is to enter into a chronic pain management program through the [facility].

## 2019-03-01 ENCOUNTER — Other Ambulatory Visit (INDEPENDENT_AMBULATORY_CARE_PROVIDER_SITE_OTHER): Payer: Self-pay | Admitting: Specialist

## 2019-03-01 MED ORDER — MORPHINE SULFATE 15 MG PO TABS: ORAL_TABLET | ORAL | 0 refills | Status: DC

## 2019-03-01 NOTE — Telephone Encounter (Signed)
Patient called needing Rx refilled (Morphine)  Patient also advised she need to office note and convalescent time for [facility]. The number to ontact patient is [phone]

## 2019-03-04 ENCOUNTER — Encounter (INDEPENDENT_AMBULATORY_CARE_PROVIDER_SITE_OTHER): Payer: Self-pay | Admitting: Radiology

## 2019-03-04 NOTE — Telephone Encounter (Signed)
I called and lmom, I need to know if she wants me to mail them to her or if I need to fax the info to Shanon Brow and if so I need his fax number. I asked for her to call me back

## 2019-03-08 ENCOUNTER — Other Ambulatory Visit (INDEPENDENT_AMBULATORY_CARE_PROVIDER_SITE_OTHER): Payer: Self-pay | Admitting: Specialist

## 2019-03-08 NOTE — Telephone Encounter (Signed)
[name] [name] patient called for refill morphine. She will call you back to give you David's fax #

## 2019-03-08 NOTE — Telephone Encounter (Signed)
Patient to call back with fax number

## 2019-03-08 NOTE — Telephone Encounter (Signed)
Refill request sent to Dr. Louanne Skye and will wait for fax number to send letter to

## 2019-03-09 MED ORDER — MORPHINE SULFATE 15 MG PO TABS: ORAL_TABLET | ORAL | 0 refills | Status: DC

## 2019-03-10 ENCOUNTER — Telehealth (INDEPENDENT_AMBULATORY_CARE_PROVIDER_SITE_OTHER): Payer: Self-pay | Admitting: Specialist

## 2019-03-10 NOTE — Telephone Encounter (Signed)
Patient returned your call and gave the following email address.  It is [email or web address].  She also stated that he needs the convulsant time.  CB#[phone].  Thank you.

## 2019-03-11 ENCOUNTER — Encounter (INDEPENDENT_AMBULATORY_CARE_PROVIDER_SITE_OTHER): Payer: Self-pay | Admitting: Radiology

## 2019-03-11 NOTE — Telephone Encounter (Signed)
I have emailed the requested info [name] [name] at the requested email.

## 2019-03-17 ENCOUNTER — Other Ambulatory Visit (INDEPENDENT_AMBULATORY_CARE_PROVIDER_SITE_OTHER): Payer: Self-pay | Admitting: Specialist

## 2019-03-17 ENCOUNTER — Telehealth (INDEPENDENT_AMBULATORY_CARE_PROVIDER_SITE_OTHER): Payer: Self-pay | Admitting: Specialist

## 2019-03-17 MED ORDER — MORPHINE SULFATE 15 MG PO TABS: ORAL_TABLET | ORAL | 0 refills | Status: DC

## 2019-03-17 NOTE — Telephone Encounter (Signed)
See message concerning Rx refill.  Thank You.

## 2019-03-17 NOTE — Telephone Encounter (Signed)
Patient called needing Rx refilled (Morphine) The number to contact patient is [phone]

## 2019-03-17 NOTE — Telephone Encounter (Signed)
Rx for Morphine Sulfate IR sent to her pharmacy. jen

## 2019-03-17 NOTE — Telephone Encounter (Signed)
Please advise patient.  Thank You.

## 2019-03-17 NOTE — Telephone Encounter (Signed)
I called patient and advised. 

## 2019-03-23 ENCOUNTER — Other Ambulatory Visit (INDEPENDENT_AMBULATORY_CARE_PROVIDER_SITE_OTHER): Payer: Self-pay | Admitting: Specialist

## 2019-03-23 NOTE — Telephone Encounter (Signed)
Sent to Dr. Nitka ° ° °

## 2019-03-23 NOTE — Telephone Encounter (Signed)
Patient called requesting an RX refill on her Morphine. Patient uses [facility] on [address].  [phone].  Thank you.

## 2019-03-24 MED ORDER — MORPHINE SULFATE 15 MG PO TABS: ORAL_TABLET | ORAL | 0 refills | Status: DC

## 2019-03-31 ENCOUNTER — Other Ambulatory Visit (INDEPENDENT_AMBULATORY_CARE_PROVIDER_SITE_OTHER): Payer: Self-pay | Admitting: Specialist

## 2019-03-31 MED ORDER — MORPHINE SULFATE 15 MG PO TABS: ORAL_TABLET | ORAL | 0 refills | Status: DC

## 2019-03-31 NOTE — Telephone Encounter (Signed)
Patient left a message yesterday requesting a RX refill on her Morphine.  She would also like for you to call her, she would like to give you some important information.  CB#[phone].  Thank you.

## 2019-04-07 ENCOUNTER — Other Ambulatory Visit (INDEPENDENT_AMBULATORY_CARE_PROVIDER_SITE_OTHER): Payer: Self-pay | Admitting: Specialist

## 2019-04-07 MED ORDER — MORPHINE SULFATE 15 MG PO TABS: ORAL_TABLET | ORAL | 0 refills | Status: DC

## 2019-04-07 NOTE — Telephone Encounter (Signed)
Patient left a message yesterday requesting an RX refill on her Morphine.  She would also like for you to call her.  CB#[phone].  Thank you.

## 2019-04-07 NOTE — Telephone Encounter (Signed)
lmom for her to call us back and if she has to leave a message to please leave a detailed message for the front desk to send back to me.

## 2019-04-08 NOTE — Telephone Encounter (Signed)
I called and lmom for her again to call us back

## 2019-04-13 ENCOUNTER — Other Ambulatory Visit (INDEPENDENT_AMBULATORY_CARE_PROVIDER_SITE_OTHER): Payer: Self-pay | Admitting: Specialist

## 2019-04-13 NOTE — Telephone Encounter (Signed)
1) Returning call to [facility]  2) Rx refill Morphine

## 2019-04-14 MED ORDER — MORPHINE SULFATE 15 MG PO TABS: ORAL_TABLET | ORAL | 0 refills | Status: DC

## 2019-04-14 NOTE — Telephone Encounter (Signed)
I called and spoke with patient, she states that she did get into [facility] in [place], however her appt is not until 06/03/2019, she states that they have been closed and will start seeing patients again next week so it has pushed her out for a few weeks, but she wanted to let Dr. Louanne Skye know so she can continue to get her meds until then.

## 2019-04-14 NOTE — Telephone Encounter (Signed)
Methocarbamol refill request 

## 2019-04-14 NOTE — Telephone Encounter (Signed)
I called and spoke with patient, she states that she did get into [facility] in [place], however her appt is not until 06/03/2019, she states that they have been closed and will start seeing patients again next week so it has pushed her out for a few weeks, but she wanted to let Dr. Louanne Skye know so she can continue to get her meds until then.

## 2019-04-21 ENCOUNTER — Other Ambulatory Visit: Payer: Self-pay | Admitting: Specialist

## 2019-04-21 MED ORDER — MORPHINE SULFATE 15 MG PO TABS: ORAL_TABLET | ORAL | 0 refills | Status: DC

## 2019-04-21 NOTE — Telephone Encounter (Signed)
Patient called needing Rx refilled (Morphine) The number to contact patient is [phone]

## 2019-04-27 ENCOUNTER — Other Ambulatory Visit: Payer: Self-pay | Admitting: Specialist

## 2019-04-27 NOTE — Telephone Encounter (Signed)
Patient called needing Rx refilled (Morphine) The number to contact patient is [phone]

## 2019-04-28 MED ORDER — MORPHINE SULFATE 15 MG PO TABS: ORAL_TABLET | ORAL | 0 refills | Status: DC

## 2019-04-28 NOTE — Telephone Encounter (Signed)
Sent request for morphine refill request to Dr. Louanne Skye

## 2019-05-04 ENCOUNTER — Telehealth: Payer: Self-pay | Admitting: Specialist

## 2019-05-04 ENCOUNTER — Other Ambulatory Visit: Payer: Self-pay | Admitting: Radiology

## 2019-05-04 NOTE — Telephone Encounter (Signed)
Request sent to Dr. Louanne Skye, please check meds and check date is was approved/rx'd

## 2019-05-04 NOTE — Telephone Encounter (Signed)
Patient called and stated that she needed refill on RX morphine.  Please call patient to advise. [phone]

## 2019-05-05 MED ORDER — MORPHINE SULFATE 15 MG PO TABS: ORAL_TABLET | ORAL | 0 refills | Status: DC

## 2019-05-11 ENCOUNTER — Other Ambulatory Visit: Payer: Self-pay | Admitting: Specialist

## 2019-05-11 NOTE — Telephone Encounter (Signed)
Patient called in for refill for her morphine (MSIR) 15 MG tablet

## 2019-05-12 MED ORDER — MORPHINE SULFATE 15 MG PO TABS: ORAL_TABLET | ORAL | 0 refills | Status: DC

## 2019-05-18 ENCOUNTER — Other Ambulatory Visit: Payer: Self-pay | Admitting: Specialist

## 2019-05-18 NOTE — Telephone Encounter (Signed)
Pt called in requesting a refill on her medication for morphine 15MG .  [phone]

## 2019-05-20 MED ORDER — MORPHINE SULFATE 15 MG PO TABS: ORAL_TABLET | ORAL | 0 refills | Status: DC

## 2019-05-25 ENCOUNTER — Other Ambulatory Visit: Payer: Self-pay | Admitting: Specialist

## 2019-05-25 NOTE — Telephone Encounter (Signed)
Patient called requesting refill of Morphine-please call patient to advise  [phone]

## 2019-05-26 MED ORDER — MORPHINE SULFATE 15 MG PO TABS: ORAL_TABLET | ORAL | 0 refills | Status: DC

## 2019-06-02 ENCOUNTER — Other Ambulatory Visit: Payer: Self-pay | Admitting: Specialist

## 2019-06-02 NOTE — Telephone Encounter (Signed)
Rx refill Morphine 15mg  tablet

## 2019-06-03 NOTE — Telephone Encounter (Signed)
I called and she states that she went today to [facility] and they have changed her from this so this can be denied

## 2019-06-03 NOTE — Telephone Encounter (Signed)
See Dr. Otho Ket note February 24, 2019.  Where do we stand on pain management with [facility]

## 2019-08-25 ENCOUNTER — Ambulatory Visit: Payer: No Typology Code available for payment source | Admitting: Specialist

## 2019-08-27 ENCOUNTER — Ambulatory Visit: Payer: Self-pay | Admitting: Specialist

## 2019-09-10 ENCOUNTER — Emergency Department (HOSPITAL_COMMUNITY)
Admission: EM | Admit: 2019-09-10 | Discharge: 2019-09-10 | Disposition: A | Discharge status: Home / Self Care | Payer: No Typology Code available for payment source | Attending: Emergency Medicine | Admitting: Emergency Medicine

## 2019-09-10 ENCOUNTER — Other Ambulatory Visit: Payer: Self-pay

## 2019-09-10 DIAGNOSIS — L03116 Cellulitis of left lower limb: Secondary | ICD-10-CM | POA: Diagnosis not present

## 2019-09-10 DIAGNOSIS — L539 Erythematous condition, unspecified: Secondary | ICD-10-CM | POA: Diagnosis present

## 2019-09-10 DIAGNOSIS — S80862A Insect bite (nonvenomous), left lower leg, initial encounter: Secondary | ICD-10-CM

## 2019-09-10 DIAGNOSIS — W57XXXA Bitten or stung by nonvenomous insect and other nonvenomous arthropods, initial encounter: Secondary | ICD-10-CM | POA: Diagnosis not present

## 2019-09-10 MED ORDER — DOXYCYCLINE HYCLATE 100 MG PO CAPS: 100.0000 mg | ORAL_CAPSULE | Freq: Two times a day (BID) | ORAL | 0 refills | Status: DC

## 2019-09-10 MED ORDER — DOXYCYCLINE HYCLATE 100 MG PO TABS
100.0000 mg | ORAL_TABLET | Freq: Once | ORAL | Status: AC
  Administered 2019-09-10: 100 mg via ORAL
  Filled 2019-09-10: qty 1

## 2019-09-10 NOTE — ED Triage Notes (Signed)
Brought in by ems. Insect bite x 2 days ago on left leg. C/O pain, redness and swelling. Painful to ambulate

## 2019-09-10 NOTE — ED Provider Notes (Signed)
[facility] EMERGENCY DEPARTMENT Provider Note   CSN: [id number] Arrival date & time: 09/10/19  1836     History   Chief Complaint Chief Complaint  Patient presents with  . Insect Bite    HPI [name] [name] is a 48 y.o. female.     The history is provided by the patient and medical records. No language interpreter was used.   [name] [name] is a 48 y.o. female with past medical history as listed below who presented to the emergency department today with left leg redness after insect bite 2 days ago.  Patient states that she felt a sting on her leg at onset.  She did not see what kind of bug, but immediately noticed a small welt.  Initially, area of concern was about the size of a quarter.  Yesterday it got a little bigger, but not much.  This morning when she woke up, the lateral side of her calf was red and very warm.  Much more tender to the touch.  She cleaned the area with alcohol.  No other medication/treatments prior to arrival.  Denies history of similar.  Past Medical History:  Diagnosis Date  . Arthritis   . Chronic back pain    stenosis and HNP  . History of pulmonary embolus (PE) 03/2008  . Pneumonia    "walking" in the 90's  . Urinary urgency   . Weakness    numbness and tingling on left side    Patient Active Problem List   Diagnosis Date Noted  . Recurrent displacement of lumbar disc 08/14/2018    Class: Chronic  . Status post lumbar laminectomy 08/14/2018  . S/P lumbar and lumbosacral fusion by anterior technique 10/09/2016    Class: Status post  . Disc disease, degenerative, lumbar or lumbosacral 08/26/2016    Class: Chronic  . 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  . Right lower quadrant pain 06/06/2014  . Herniated nucleus pulposus, lumbar 12/10/2013  . S/P hysterectomy 02/09/2013  . Pulmonary embolism ([place])   . Protein C deficiency ([place])   . Fibroids   . Endometriosis     Past Surgical History:  Procedure  Laterality Date  . ABDOMINAL EXPOSURE N/A 08/26/2016   Procedure: ABDOMINAL EXPOSURE;  Surgeon: Rosetta Posner, MD;  Location: [place];  Service: Vascular;  Laterality: N/A;  . ABDOMINAL HYSTERECTOMY    . ANTERIOR LUMBAR FUSION N/A 08/26/2016   Procedure: ANTERIOR LUMBAR FUSION L5-S1, EXCISION OF HERNIATED DISC LEFT L5-S1, ZERO P LUMBAR INTERBODY IMPLANT, LEFT ILIAC CREST BONE GRAFT ;  Surgeon: Jessy Oto, MD;  Location: [place];  Service: Orthopedics;  Laterality: N/A;  . BACK SURGERY    . DIAGNOSTIC LAPAROSCOPY  2004   x3  . DILATION AND EVACUATION N/A 01/28/2013   Procedure: DILATATION AND EVACUATION;  Surgeon: Princess Bruins, MD;  Location: [facility];  Service: Gynecology;  Laterality: N/A;  . KNEE ARTHROSCOPY Left    x 3  . LUMBAR LAMINECTOMY N/A 12/10/2013   Procedure: LEFT L5-S1 MICRODISCECTOMY USING MIS;  Surgeon: Jessy Oto, MD;  Location: [place];  Service: Orthopedics;  Laterality: N/A;  . LUMBAR LAMINECTOMY N/A 12/30/2014   Procedure: LEFT L5-S1 Redo MICRODISCECTOMY;  Surgeon: Jessy Oto, MD;  Location: [place];  Service: Orthopedics;  Laterality: N/A;  . LUMBAR LAMINECTOMY/DECOMPRESSION MICRODISCECTOMY N/A 08/14/2018   Procedure: LEFT L5-S1 FACETECTOMY WITH SI PEDICULECTOMY AND EXPLORATION OF LEFT L5-S1 DISC;  Surgeon: Jessy Oto, MD;  Location: [place];  Service: Orthopedics;  Laterality: N/A;  . ROBOTIC ASSISTED TOTAL HYSTERECTOMY N/A 02/09/2013   Procedure: ROBOTIC ASSISTED TOTAL HYSTERECTOMY;  Surgeon: Princess Bruins, MD;  Location: [facility];  Service: Gynecology;  Laterality: N/A;  . UNILATERAL SALPINGECTOMY Right 02/09/2013   Procedure: UNILATERAL SALPINGECTOMY;  Surgeon: Princess Bruins, MD;  Location: [facility];  Service: Gynecology;  Laterality: Right;  . WISDOM TOOTH EXTRACTION       OB History   No obstetric history on file.      Home Medications    Prior to Admission medications   Medication Sig Start Date End Date Taking? Authorizing Provider  diclofenac  (VOLTAREN) 50 MG EC tablet PLEASE SEE ATTACHED FOR DETAILED DIRECTIONS 10/23/18   Jessy Oto, MD  methocarbamol (ROBAXIN) 500 MG tablet TAKE 1 TABLET BY MOUTH EVERY 8 HOURS AS NEEDED 04/14/19   Jessy Oto, MD  morphine (MSIR) 15 MG tablet Take one tablet po every 8 hours We are weaning this medication and will stop it 05/26/19   Jessy Oto, MD  naloxone [facility]) nasal spray 4 mg/0.1 mL Use as directed for opioid respiratory depression or overdose. 08/15/18   Jessy Oto, MD  oxyCODONE-acetaminophen (PERCOCET/ROXICET) 5-325 MG tablet Take 1 tablet by mouth every 8 (eight) hours as needed. 12/21/18   Newt Minion, MD  pregabalin (LYRICA) 75 MG capsule Take 1 capsule (75 mg total) by mouth 2 (two) times daily. 11/23/18   Jessy Oto, MD    Family History No family history on file.  Social History Social History   Tobacco Use  . Smoking status: Current Every Day Smoker    Packs/day: 0.50    Years: 25.00    Pack years: 12.50    Types: Cigarettes  . Smokeless tobacco: Never Used  Substance Use Topics  . Alcohol use: Yes  . Drug use: No     Allergies   Hydrocodone-acetaminophen   Review of Systems Review of Systems  Constitutional: Negative for fever.  Respiratory: Negative for shortness of breath.   Cardiovascular: Positive for leg swelling. Negative for chest pain and palpitations.  Musculoskeletal: Positive for myalgias. Negative for arthralgias and joint swelling.  Skin: Positive for color change and wound.  Neurological: Negative for weakness and numbness.     Physical Exam Updated Vital Signs BP 112/79 (BP Location: Right Arm)   Pulse 86   Temp 97.6 F (36.4 C) (Oral)   Resp 18   Ht 5\' 7"  (1.702 m)   Wt 72.6 kg   SpO2 99%   BMI 25.07 kg/m   Physical Exam Vitals signs and nursing note reviewed.  Constitutional:      General: She is not in acute distress.    Appearance: She is well-developed.  HENT:     Head: Normocephalic and atraumatic.  Neck:      Musculoskeletal: Neck supple.  Cardiovascular:     Rate and Rhythm: Normal rate and regular rhythm.     Heart sounds: Normal heart sounds. No murmur.  Pulmonary:     Effort: Pulmonary effort is normal. No respiratory distress.     Breath sounds: Normal breath sounds. No wheezing or rales.  Musculoskeletal:     Comments: Left lower extremity with firm indurated tender area consistent with insect bite with surrounding erythema.  No fluctuance to suggest abscess.  Warm to the touch.  Minimally swollen.  Skin:    General: Skin is warm and dry.  Neurological:     Mental Status: She is alert.  ED Treatments / Results  Labs (all labs ordered are listed, but only abnormal results are displayed) Labs Reviewed - No data to display  EKG None  Radiology No results found.  Procedures Procedures (including critical care time)  Medications Ordered in ED Medications  doxycycline (VIBRA-TABS) tablet 100 mg (has no administration in time range)     Initial Impression / Assessment and Plan / ED Course  I have reviewed the triage vital signs and the nursing notes.  Pertinent labs & imaging results that were available during my care of the patient were reviewed by me and considered in my medical decision making (see chart for details).        [name] [name] is a 48 y.o. female who presents to ED for worsening area of redness and swelling after insect bite 2 days ago.  On exam, patient is afebrile, hemodynamically stable. She does have area to the leg c/w initial insect bite. No abscess appreciated but significant amount of erythema surrounding just tender and warm to the touch.  Given this acute worsening 2 days following the bite itself, suspect cellulitis and will treat as such.  Area was demarcated and dated in the emergency department by nursing staff.  Will start on doxycycline.  Encouraged to follow-up with PCP for recheck.  Understands to return to ER should her symptoms  worsen and all questions were answered.   Final Clinical Impressions(s) / ED Diagnoses   Final diagnoses:  Cellulitis of left lower extremity  Insect bite of left lower leg, initial encounter    ED Discharge Orders    None       Nyshaun Standage, Ozella Almond, PA-C 09/10/19 2012    Deno Etienne, DO 09/10/19 2207

## 2019-09-10 NOTE — ED Notes (Signed)
Patient Alert and oriented to baseline. Stable and ambulatory to baseline. Patient verbalized understanding of the discharge instructions.  Patient belongings were taken by the patient.   

## 2019-09-10 NOTE — Discharge Instructions (Addendum)
It was my pleasure taking care of you today!   Please take all of your antibiotics until finished!   Please follow up with your doctor or return to the ER in 2 days for recheck of your infection if you are not improving.  Call your doctor sooner or return to the ER if you develop worsening signs of infection such as: increased redness, increased pain, pus, fever, or other symptoms that concern you.

## 2019-09-12 ENCOUNTER — Emergency Department (HOSPITAL_COMMUNITY)
Admission: EM | Admit: 2019-09-12 | Discharge: 2019-09-12 | Discharge status: Against Medical Advice | Payer: No Typology Code available for payment source | Attending: Emergency Medicine | Admitting: Emergency Medicine

## 2019-09-12 ENCOUNTER — Encounter (HOSPITAL_COMMUNITY): Payer: Self-pay | Admitting: Emergency Medicine

## 2019-09-12 ENCOUNTER — Emergency Department (HOSPITAL_COMMUNITY): Payer: No Typology Code available for payment source

## 2019-09-12 ENCOUNTER — Other Ambulatory Visit: Payer: Self-pay

## 2019-09-12 ENCOUNTER — Emergency Department (HOSPITAL_COMMUNITY)
Admission: EM | Admit: 2019-09-12 | Discharge: 2019-09-13 | Disposition: A | Discharge status: Home / Self Care | Payer: No Typology Code available for payment source | Source: Home / Self Care | Attending: Emergency Medicine | Admitting: Emergency Medicine

## 2019-09-12 DIAGNOSIS — L03116 Cellulitis of left lower limb: Secondary | ICD-10-CM

## 2019-09-12 DIAGNOSIS — Z5321 Procedure and treatment not carried out due to patient leaving prior to being seen by health care provider: Secondary | ICD-10-CM | POA: Insufficient documentation

## 2019-09-12 DIAGNOSIS — S80862D Insect bite (nonvenomous), left lower leg, subsequent encounter: Secondary | ICD-10-CM | POA: Diagnosis present

## 2019-09-12 DIAGNOSIS — M7989 Other specified soft tissue disorders: Secondary | ICD-10-CM

## 2019-09-12 DIAGNOSIS — W57XXXD Bitten or stung by nonvenomous insect and other nonvenomous arthropods, subsequent encounter: Secondary | ICD-10-CM | POA: Insufficient documentation

## 2019-09-12 LAB — CBC WITH DIFFERENTIAL/PLATELET
Abs Immature Granulocytes: 0.03 10*3/uL (ref 0.00–0.07)
Basophils Absolute: 0.1 10*3/uL (ref 0.0–0.1)
Basophils Relative: 1 %
Eosinophils Absolute: 0.1 10*3/uL (ref 0.0–0.5)
Eosinophils Relative: 2 %
HCT: 42.9 % (ref 36.0–46.0)
Hemoglobin: 14.5 g/dL (ref 12.0–15.0)
Immature Granulocytes: 0 %
Lymphocytes Relative: 27 %
Lymphs Abs: 2 10*3/uL (ref 0.7–4.0)
MCH: 31.5 pg (ref 26.0–34.0)
MCHC: 33.8 g/dL (ref 30.0–36.0)
MCV: 93.1 fL (ref 80.0–100.0)
Monocytes Absolute: 0.8 10*3/uL (ref 0.1–1.0)
Monocytes Relative: 10 %
Neutro Abs: 4.4 10*3/uL (ref 1.7–7.7)
Neutrophils Relative %: 60 %
Platelets: 316 10*3/uL (ref 150–400)
RBC: 4.61 MIL/uL (ref 3.87–5.11)
RDW: 13 % (ref 11.5–15.5)
WBC: 7.4 10*3/uL (ref 4.0–10.5)
nRBC: 0 % (ref 0.0–0.2)

## 2019-09-12 LAB — BASIC METABOLIC PANEL
Anion gap: 14 (ref 5–15)
BUN: 6 mg/dL (ref 6–20)
CO2: 22 mmol/L (ref 22–32)
Calcium: 8.9 mg/dL (ref 8.9–10.3)
Chloride: 100 mmol/L (ref 98–111)
Creatinine, Ser: 0.76 mg/dL (ref 0.44–1.00)
GFR calc Af Amer: >60 mL/min (ref >60)
GFR calc non Af Amer: >60 mL/min (ref >60)
Glucose, Bld: 85 mg/dL (ref 70–99)
Potassium: 4.3 mmol/L (ref 3.5–5.1)
Sodium: 136 mmol/L (ref 135–145)

## 2019-09-12 MED ORDER — OXYCODONE-ACETAMINOPHEN 5-325 MG PO TABS
1.0000 | ORAL_TABLET | Freq: Once | ORAL | Status: AC
  Administered 2019-09-12: 1 via ORAL
  Filled 2019-09-12: qty 1

## 2019-09-12 NOTE — ED Triage Notes (Signed)
Pt here to get her leg rechecked from 2 days ago from an insect bite , pt was placed on antibiotics pt went to [facility] via EMS and left and came here

## 2019-09-12 NOTE — ED Triage Notes (Signed)
Per [facility] pt from home for insect bite on left leg that is more painful from when was seen at [facility] ED on 25th.  Pt given Fentanyl 16mcg in route. Vitals: 123/76, 106/70, 96HR, 18R, 98% on RA, temp 98.1

## 2019-09-12 NOTE — ED Notes (Signed)
I took her IV out per patient request

## 2019-09-12 NOTE — ED Provider Notes (Signed)
[facility] EMERGENCY DEPARTMENT Provider Note   CSN: [id number] Arrival date & time: 09/12/19  1723     History   Chief Complaint Chief Complaint  Patient presents with  . Wound Check    HPI [name] [name] is a 48 y.o. female.     [name] [name] is a 48 y.o. female with a history of PE, fibroids, and chronic back pain who presents to the emergency department for evaluation of worsening cellulitis.  Patient was seen in the ED 2 days ago for evaluation of an insect bite to the left lower extremity with associated redness and warmth.  At that time patient had significant surrounding redness but no fluctuance to suggest abscess.  Patient was started on doxycycline, which she has been taking for 2 days.  She returns today due to increasing pain and swelling.  Patient thinks the redness may have increased somewhat as well.  She reports leg is very tender to even light touch.  She denies any drainage from the area of insect bite.  Is not sure what insect bit her.  She denies any associated fevers, chills, nausea or vomiting.  She does have a history of PE previously and is not on blood thinners at this time, also questions if lower extremity swelling could be related to blood clot.  No chest pain or shortness of breath.     Past Medical History:  Diagnosis Date  . Arthritis   . Chronic back pain    stenosis and HNP  . History of pulmonary embolus (PE) 03/2008  . Pneumonia    "walking" in the 90's  . Urinary urgency   . Weakness    numbness and tingling on left side    Patient Active Problem List   Diagnosis Date Noted  . Recurrent displacement of lumbar disc 08/14/2018    Class: Chronic  . Status post lumbar laminectomy 08/14/2018  . S/P lumbar and lumbosacral fusion by anterior technique 10/09/2016    Class: Status post  . Disc disease, degenerative, lumbar or lumbosacral 08/26/2016    Class: Chronic  . 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   . Right lower quadrant pain 06/06/2014  . Herniated nucleus pulposus, lumbar 12/10/2013  . S/P hysterectomy 02/09/2013  . Pulmonary embolism ([place])   . Protein C deficiency ([place])   . Fibroids   . Endometriosis     Past Surgical History:  Procedure Laterality Date  . ABDOMINAL EXPOSURE N/A 08/26/2016   Procedure: ABDOMINAL EXPOSURE;  Surgeon: Rosetta Posner, MD;  Location: [facility];  Service: Vascular;  Laterality: N/A;  . ABDOMINAL HYSTERECTOMY    . ANTERIOR LUMBAR FUSION N/A 08/26/2016   Procedure: ANTERIOR LUMBAR FUSION L5-S1, EXCISION OF HERNIATED DISC LEFT L5-S1, ZERO P LUMBAR INTERBODY IMPLANT, LEFT ILIAC CREST BONE GRAFT ;  Surgeon: Jessy Oto, MD;  Location: [facility];  Service: Orthopedics;  Laterality: N/A;  . BACK SURGERY    . DIAGNOSTIC LAPAROSCOPY  2004   x3  . DILATION AND EVACUATION N/A 01/28/2013   Procedure: DILATATION AND EVACUATION;  Surgeon: Princess Bruins, MD;  Location: [facility];  Service: Gynecology;  Laterality: N/A;  . KNEE ARTHROSCOPY Left    x 3  . LUMBAR LAMINECTOMY N/A 12/10/2013   Procedure: LEFT L5-S1 MICRODISCECTOMY USING MIS;  Surgeon: Jessy Oto, MD;  Location: [facility];  Service: Orthopedics;  Laterality: N/A;  . LUMBAR LAMINECTOMY N/A 12/30/2014   Procedure: LEFT L5-S1 Redo MICRODISCECTOMY;  Surgeon: Jessy Oto,  MD;  Location: [facility];  Service: Orthopedics;  Laterality: N/A;  . LUMBAR LAMINECTOMY/DECOMPRESSION MICRODISCECTOMY N/A 08/14/2018   Procedure: LEFT L5-S1 FACETECTOMY WITH SI PEDICULECTOMY AND EXPLORATION OF LEFT L5-S1 DISC;  Surgeon: Jessy Oto, MD;  Location: [facility];  Service: Orthopedics;  Laterality: N/A;  . ROBOTIC ASSISTED TOTAL HYSTERECTOMY N/A 02/09/2013   Procedure: ROBOTIC ASSISTED TOTAL HYSTERECTOMY;  Surgeon: Princess Bruins, MD;  Location: [facility];  Service: Gynecology;  Laterality: N/A;  . UNILATERAL SALPINGECTOMY Right 02/09/2013   Procedure: UNILATERAL SALPINGECTOMY;  Surgeon: Princess Bruins, MD;  Location: [facility];  Service:  Gynecology;  Laterality: Right;  . WISDOM TOOTH EXTRACTION       OB History   No obstetric history on file.      Home Medications    Prior to Admission medications   Medication Sig Start Date End Date Taking? Authorizing Provider  diclofenac (VOLTAREN) 50 MG EC tablet PLEASE SEE ATTACHED FOR DETAILED DIRECTIONS 10/23/18   Jessy Oto, MD  doxycycline (VIBRAMYCIN) 100 MG capsule Take 1 capsule (100 mg total) by mouth 2 (two) times daily. 09/10/19   Ward, Ozella Almond, PA-C  methocarbamol (ROBAXIN) 500 MG tablet TAKE 1 TABLET BY MOUTH EVERY 8 HOURS AS NEEDED 04/14/19   Jessy Oto, MD  morphine (MSIR) 15 MG tablet Take one tablet po every 8 hours We are weaning this medication and will stop it 05/26/19   Jessy Oto, MD  naloxone [facility]) nasal spray 4 mg/0.1 mL Use as directed for opioid respiratory depression or overdose. 08/15/18   Jessy Oto, MD  oxyCODONE-acetaminophen (PERCOCET/ROXICET) 5-325 MG tablet Take 1 tablet by mouth every 8 (eight) hours as needed. 12/21/18   Newt Minion, MD  pregabalin (LYRICA) 75 MG capsule Take 1 capsule (75 mg total) by mouth 2 (two) times daily. 11/23/18   Jessy Oto, MD    Family History No family history on file.  Social History Social History   Tobacco Use  . Smoking status: Current Every Day Smoker    Packs/day: 0.50    Years: 25.00    Pack years: 12.50    Types: Cigarettes  . Smokeless tobacco: Never Used  Substance Use Topics  . Alcohol use: Yes  . Drug use: No     Allergies   Hydrocodone-acetaminophen   Review of Systems Review of Systems  Constitutional: Negative for chills and fever.  Cardiovascular: Positive for leg swelling.  Musculoskeletal: Positive for myalgias. Negative for arthralgias and joint swelling.  Skin: Positive for color change. Negative for rash.  Neurological: Negative for weakness and numbness.  All other systems reviewed and are negative.    Physical Exam Updated Vital Signs BP  101/72   Pulse 65   Temp 98 F (36.7 C) (Oral)   Resp 18   SpO2 99%   Physical Exam Vitals signs and nursing note reviewed.  Constitutional:      General: She is not in acute distress.    Appearance: Normal appearance. She is well-developed and normal weight. She is not diaphoretic.  HENT:     Head: Normocephalic and atraumatic.  Eyes:     General:        Right eye: No discharge.        Left eye: No discharge.  Cardiovascular:     Rate and Rhythm: Normal rate and regular rhythm.     Heart sounds: Normal heart sounds.  Pulmonary:     Effort: Pulmonary effort is normal. No respiratory distress.  Breath sounds: Normal breath sounds. No wheezing or rales.  Abdominal:     General: Bowel sounds are normal. There is no distension.     Palpations: Abdomen is soft. There is no mass.     Tenderness: There is no abdominal tenderness. There is no guarding.  Musculoskeletal:     Left lower leg: Edema present.     Comments: Left lower extremity with likely insect bite to the left lateral lower leg with surrounding erythema.  On initial exam erythema seems to be improved from previous demarcation on 9/25, although the leg is extremely tender to even light palpation.  There is nonpitting edema up to the mid shin and extending into the foot and ankle.  There are 2+ DP and PT pulse, normal sensation, 5/5 strength.  Good capillary refill.  Skin:    General: Skin is warm and dry.     Capillary Refill: Capillary refill takes less than 2 seconds.  Neurological:     Mental Status: She is alert.     Coordination: Coordination normal.     Comments: Speech is clear, able to follow commands Moves extremities without ataxia, coordination intact      ED Treatments / Results  Labs (all labs ordered are listed, but only abnormal results are displayed) Labs Reviewed  BASIC METABOLIC PANEL  CBC WITH DIFFERENTIAL/PLATELET    EKG None  Radiology Dg Tibia/fibula Left  Result Date: 09/12/2019  CLINICAL DATA:  Cellulitis. Assess for gas. Patient reports swelling from insect bite, now with cellulitis. EXAM: LEFT TIBIA AND FIBULA - 2 VIEW COMPARISON:  None. FINDINGS: There is no evidence of fracture or other focal bone lesions. No periosteal reaction or bony destructive change. Generalized soft tissue edema. No soft tissue air. No radiopaque foreign body. IMPRESSION: Generalized soft tissue edema without soft tissue air. No osseous abnormality. Electronically Signed   By: Keith Rake M.D.   On: 09/12/2019 22:32    Procedures Procedures (including critical care time)  Medications Ordered in ED Medications  oxyCODONE-acetaminophen (PERCOCET/ROXICET) 5-325 MG per tablet 1 tablet (1 tablet Oral Given 09/12/19 2231)     Initial Impression / Assessment and Plan / ED Course  I have reviewed the triage vital signs and the nursing notes.  Pertinent labs & imaging results that were available during my care of the patient were reviewed by me and considered in my medical decision making (see chart for details).  48 year old female presents to the ED with concern for worsening cellulitis, was seen in the ED 2 days ago and diagnosed with cellulitis associated with a likely insect bite.  She has been on doxycycline for the past 2 days but reports increasing pain and swelling to the left lower extremity, tender even to light touch.  Neurovascularly intact.  Pain area of redness appears decreased from previous demarcation but the leg is certainly swollen and tender.  Bedside ultrasound does not reveal any abscess or drainable fluid collection.  Case discussed with Dr. Darl Householder, will check basic labs and x-ray to evaluate for any soft tissue gas.  X-ray shows generalized soft tissue edema but no gas, and no osseous abnormality.  Lab work is reassuring with no leukocytosis, normal hemoglobin and no significant electrolyte abnormalities.  Pain treated here in the ED.  Discussed reassuring work-up with  patient, she is very concerned about increased swelling, does have history of PE previously and is not currently on blood thinners, will arrange for outpatient DVT study.  Will have nursing staff agree Elta [place]  new area of redness will have patient continue on antibiotics.  12:00 PM plan was initially for discharge, but nursing staff went into demarcate and noticed faint redness spreading beyond previous demarcation, patient became very upset stating "there is something wrong with my leg, I am in pain".  I have reexamined the leg and there does appear to be a faint area of redness that is spread beyond the demarcation, but the area of concentrated redness is well within the previous demarcation. PA Antonietta Breach, went in to evaluate as well and provided reassurance. Will discharge home, and have patient continue antibiotics, DVT study ordered for the morning. Discussed return precautions and encouraged pt to elevate leg to help with edema. Specific return precautions provided. Patient and mother express understanding and agreement with plan.  Final Clinical Impressions(s) / ED Diagnoses   Final diagnoses:  Cellulitis of left lower extremity  Left leg swelling    ED Discharge Orders         Ordered    [facility]     09/12/19 2323           Jacqlyn Larsen, PA-C 09/13/19 0025    Drenda Freeze, MD 09/21/19 [phone]

## 2019-09-12 NOTE — Discharge Instructions (Signed)
Continue taking antibiotics, use Tylenol as needed for pain, you can also use ice. Elevate the leg to help help with swelling, sometimes reactive swelling can show up even though the infection is improving.  Follow instructions tomorrow for outpatient DVT study  Follow-up with your PCP.  Return to the ED for fevers, increasing redness or any other new or concerning symptoms.

## 2019-09-12 NOTE — ED Notes (Signed)
Pt refusing to stay due to wait times. Upset that she got diverted from [facility] ED where she was wanting to go. Requested to have her IV taken at so she can leave.

## 2019-09-13 ENCOUNTER — Inpatient Hospital Stay (HOSPITAL_COMMUNITY): Admission: RE | Admit: 2019-09-13 | Payer: No Typology Code available for payment source | Source: Ambulatory Visit

## 2019-09-13 NOTE — ED Notes (Signed)
Patient verbalizes understanding of discharge instructions. Opportunity for questioning and answers were provided. Armband removed by staff, pt discharged from ED.  

## 2019-09-22 ENCOUNTER — Ambulatory Visit: Payer: No Typology Code available for payment source | Admitting: Specialist

## 2019-09-22 ENCOUNTER — Telehealth: Payer: Self-pay | Admitting: Specialist

## 2019-09-22 NOTE — Telephone Encounter (Signed)
Patient's appointment has been r/s til October 20th.  I see that we had an authorization that expired on 12/17/18.  I do not see another one in her chart, so I think we need to get authorization from the [place] for the visit.  Thank you.

## 2019-10-05 ENCOUNTER — Other Ambulatory Visit: Payer: Self-pay

## 2019-10-05 ENCOUNTER — Telehealth: Payer: Self-pay | Admitting: Specialist

## 2019-10-05 ENCOUNTER — Ambulatory Visit (INDEPENDENT_AMBULATORY_CARE_PROVIDER_SITE_OTHER): Payer: No Typology Code available for payment source | Admitting: Specialist

## 2019-10-05 ENCOUNTER — Ambulatory Visit (INDEPENDENT_AMBULATORY_CARE_PROVIDER_SITE_OTHER): Payer: No Typology Code available for payment source

## 2019-10-05 ENCOUNTER — Encounter: Payer: Self-pay | Admitting: Specialist

## 2019-10-05 VITALS — BP 106/79 | HR 72 | Ht 67.0 in | Wt 160.0 lb

## 2019-10-05 DIAGNOSIS — Z981 Arthrodesis status: Secondary | ICD-10-CM

## 2019-10-05 DIAGNOSIS — M961 Postlaminectomy syndrome, not elsewhere classified: Secondary | ICD-10-CM | POA: Diagnosis not present

## 2019-10-05 DIAGNOSIS — Z8739 Personal history of other diseases of the musculoskeletal system and connective tissue: Secondary | ICD-10-CM

## 2019-10-05 NOTE — Telephone Encounter (Signed)
Patient will fax the authorization letter back to [place] once she arrives home.

## 2019-10-05 NOTE — Progress Notes (Signed)
Office Visit Note   Patient: [name] [name]           Date of Birth: January 12, 1971           MRN: [id number] Visit Date: 10/05/2019              Requested by: Buzzy Han, MD [place],  [place] [id number] PCP: Buzzy Han, MD   Assessment & Plan: Visit Diagnoses:  1. Lumbar post-laminectomy syndrome   2. History of left foot drop   3. Status post lumbar and lumbosacral fusion by anterior technique     Plan: Avoid frequent bending and stooping  No lifting greater than 10 lbs. May use ice or moist heat for pain. Weight loss is of benefit. AFO left foot Exercise is important to improve your indurance and does allow people to function better inspite of back pain.    Follow-Up Instructions: Return in about 6 months (around 04/04/2020).   Orders:  Orders Placed This Encounter  Procedures  . XR Lumbar Spine 2-3 Views   No orders of the defined types were placed in this encounter.     Procedures: No procedures performed   Clinical Data: No additional findings.   Subjective: Chief Complaint  Patient presents with  . Lower Back - Follow-up    48 year old female post lumbar laminectomy with severe nerve pain and redo discectomies x2 then anterior lumbar fusion   Review of Systems   Objective: Vital Signs: BP 106/79 (BP Location: Left Arm, Patient Position: Sitting)   Pulse 72   Ht 5\' 7"  (1.702 m)   Wt 160 lb (72.6 kg)   BMI 25.06 kg/m   Physical Exam  Ortho Exam  Specialty Comments:  No specialty comments available.  Imaging: No results found.   PMFS History: Patient Active Problem List   Diagnosis Date Noted  . Recurrent displacement of lumbar disc 08/14/2018    Priority: High    Class: Chronic  . S/P lumbar and lumbosacral fusion by anterior technique 10/09/2016    Priority: High    Class: Status post  . Disc disease, degenerative, lumbar or lumbosacral 08/26/2016    Priority: High    Class:  Chronic  . Herniated nucleus pulposus, lumbar 12/10/2013    Priority: High  . Status post lumbar laminectomy 08/14/2018  . 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  . Right lower quadrant pain 06/06/2014  . S/P hysterectomy 02/09/2013  . Pulmonary embolism ([place])   . Protein C deficiency ([place])   . Fibroids   . Endometriosis    Past Medical History:  Diagnosis Date  . Arthritis   . Chronic back pain    stenosis and HNP  . History of pulmonary embolus (PE) 03/2008  . Pneumonia    "walking" in the 90's  . Urinary urgency   . Weakness    numbness and tingling on left side    History reviewed. No pertinent family history.  Past Surgical History:  Procedure Laterality Date  . ABDOMINAL EXPOSURE N/A 08/26/2016   Procedure: ABDOMINAL EXPOSURE;  Surgeon: Rosetta Posner, MD;  Location: [place];  Service: Vascular;  Laterality: N/A;  . ABDOMINAL HYSTERECTOMY    . ANTERIOR LUMBAR FUSION N/A 08/26/2016   Procedure: ANTERIOR LUMBAR FUSION L5-S1, EXCISION OF HERNIATED DISC LEFT L5-S1, ZERO P LUMBAR INTERBODY IMPLANT, LEFT ILIAC CREST BONE GRAFT ;  Surgeon: Jessy Oto, MD;  Location: [facility];  Service: Orthopedics;  Laterality: N/A;  . BACK SURGERY    .  DIAGNOSTIC LAPAROSCOPY  2004   x3  . DILATION AND EVACUATION N/A 01/28/2013   Procedure: DILATATION AND EVACUATION;  Surgeon: Princess Bruins, MD;  Location: [facility];  Service: Gynecology;  Laterality: N/A;  . KNEE ARTHROSCOPY Left    x 3  . LUMBAR LAMINECTOMY N/A 12/10/2013   Procedure: LEFT L5-S1 MICRODISCECTOMY USING MIS;  Surgeon: Jessy Oto, MD;  Location: [facility];  Service: Orthopedics;  Laterality: N/A;  . LUMBAR LAMINECTOMY N/A 12/30/2014   Procedure: LEFT L5-S1 Redo MICRODISCECTOMY;  Surgeon: Jessy Oto, MD;  Location: [facility];  Service: Orthopedics;  Laterality: N/A;  . LUMBAR LAMINECTOMY/DECOMPRESSION MICRODISCECTOMY N/A 08/14/2018   Procedure: LEFT L5-S1 FACETECTOMY WITH SI PEDICULECTOMY AND EXPLORATION OF LEFT L5-S1 DISC;   Surgeon: Jessy Oto, MD;  Location: [facility];  Service: Orthopedics;  Laterality: N/A;  . ROBOTIC ASSISTED TOTAL HYSTERECTOMY N/A 02/09/2013   Procedure: ROBOTIC ASSISTED TOTAL HYSTERECTOMY;  Surgeon: Princess Bruins, MD;  Location: [facility];  Service: Gynecology;  Laterality: N/A;  . UNILATERAL SALPINGECTOMY Right 02/09/2013   Procedure: UNILATERAL SALPINGECTOMY;  Surgeon: Princess Bruins, MD;  Location: [facility];  Service: Gynecology;  Laterality: Right;  . WISDOM TOOTH EXTRACTION     Social History   Occupational History  . Not on file  Tobacco Use  . Smoking status: Current Every Day Smoker    Packs/day: 0.50    Years: 25.00    Pack years: 12.50    Types: Cigarettes  . Smokeless tobacco: Never Used  Substance and Sexual Activity  . Alcohol use: Yes  . Drug use: No  . Sexual activity: Not Currently    Birth control/protection: Surgical

## 2019-10-05 NOTE — Patient Instructions (Addendum)
Avoid frequent bending and stooping  No lifting greater than 10 lbs. May use ice or moist heat for pain. Weight loss is of benefit. AFO left footExercise is important to improve your indurance and does allow people to function better inspite of back pain.

## 2020-04-05 ENCOUNTER — Ambulatory Visit: Payer: No Typology Code available for payment source | Admitting: Specialist

## 2020-05-16 ENCOUNTER — Telehealth: Payer: Self-pay | Admitting: Specialist

## 2020-05-16 NOTE — Telephone Encounter (Signed)
Received vm from patient wanting records from surgery be faxed to [place]. IC, advised patient she needs to sign authorization form. She asked if they called and requested records could we send them. I told her yes, we can release doctor to doctor, otherwise, we would need her signed authorization.

## 2020-07-20 ENCOUNTER — Ambulatory Visit: Payer: No Typology Code available for payment source | Admitting: Specialist

## 2020-08-22 ENCOUNTER — Other Ambulatory Visit: Payer: Self-pay | Admitting: Neurological Surgery

## 2020-09-14 NOTE — Pre-Procedure Instructions (Signed)
Your procedure is scheduled on Tuesday, October 5th at 7:30a.m.  Report to [facility] Main Entrance "A" at 5:30 A.M., and check in at the Admitting office.  Call this number if you have problems the morning of surgery:  [phone]  Call [phone] if you have any questions prior to your surgery date Monday-Friday 8am-4pm    Remember:  Do not eat after midnight the night before your surgery    Take these medicines the morning of surgery with A SIP OF WATER  pregabalin (LYRICA)  tiZANidine (ZANAFLEX)   As needed: oxyCODONE (OXYCONTIN)  As of today, STOP taking any Aspirin (unless otherwise instructed by your surgeon) Aleve, Naproxen, Ibuprofen, Motrin, Advil, Goody's, BC's, all herbal medications, fish oil, and all vitamins. This includes: meloxicam (MOBIC).                      Do not wear jewelry, make up, or nail polish            Do not wear lotions, powders, perfumes, or deodorant.            Do not shave 48 hours prior to surgery.             Do not bring valuables to the hospital.            [facility] is not responsible for any belongings or valuables.  Do NOT Smoke (Tobacco/Vaping) or drink Alcohol 24 hours prior to your procedure If you use a CPAP at night, you may bring all equipment for your overnight stay.   Contacts, glasses, dentures or bridgework may not be worn into surgery.      For patients admitted to the hospital, discharge time will be determined by your treatment team.   Patients discharged the day of surgery will not be allowed to drive home, and someone needs to stay with them for 24 hours.    Special instructions:   [facility]- Preparing For Surgery  Before surgery, you can play an important role. Because skin is not sterile, your skin needs to be as free of germs as possible. You can reduce the number of germs on your skin by washing with CHG (chlorahexidine gluconate) Soap before surgery.  CHG is an antiseptic cleaner which kills germs and  bonds with the skin to continue killing germs even after washing.    Oral Hygiene is also important to reduce your risk of infection.  Remember - BRUSH YOUR TEETH THE MORNING OF SURGERY WITH YOUR REGULAR TOOTHPASTE  Please do not use if you have an allergy to CHG or antibacterial soaps. If your skin becomes reddened/irritated stop using the CHG.  Do not shave (including legs and underarms) for at least 48 hours prior to first CHG shower. It is OK to shave your face.  Please follow these instructions carefully.   1. Shower the NIGHT BEFORE SURGERY and the MORNING OF SURGERY with CHG Soap.   2. If you chose to wash your hair, wash your hair first as usual with your normal shampoo.  3. After you shampoo, rinse your hair and body thoroughly to remove the shampoo.  4. Use CHG as you would any other liquid soap. You can apply CHG directly to the skin and wash gently with a scrungie or a clean washcloth.   5. Apply the CHG Soap to your body ONLY FROM THE NECK DOWN.  Do not use on open wounds or open sores. Avoid contact with your eyes, ears,  mouth and genitals (private parts). Wash Face and genitals (private parts)  with your normal soap.   6. Wash thoroughly, paying special attention to the area where your surgery will be performed.  7. Thoroughly rinse your body with warm water from the neck down.  8. DO NOT shower/wash with your normal soap after using and rinsing off the CHG Soap.  9. Pat yourself dry with a CLEAN TOWEL.  10. Wear CLEAN PAJAMAS to bed the night before surgery  11. Place CLEAN SHEETS on your bed the night of your first shower and DO NOT SLEEP WITH PETS.   Day of Surgery: Wear Clean/Comfortable clothing the morning of surgery Do not apply any deodorants/lotions.   Remember to brush your teeth WITH YOUR REGULAR TOOTHPASTE.   Please read over the following fact sheets that you were given.

## 2020-09-15 ENCOUNTER — Other Ambulatory Visit: Payer: Self-pay

## 2020-09-15 ENCOUNTER — Encounter (HOSPITAL_COMMUNITY)
Admission: RE | Admit: 2020-09-15 | Discharge: 2020-09-15 | Disposition: A | Discharge status: Home / Self Care | Payer: No Typology Code available for payment source | Source: Ambulatory Visit | Attending: Neurological Surgery | Admitting: Neurological Surgery

## 2020-09-15 ENCOUNTER — Encounter (HOSPITAL_COMMUNITY): Payer: Self-pay

## 2020-09-15 ENCOUNTER — Other Ambulatory Visit (HOSPITAL_COMMUNITY)
Admission: RE | Admit: 2020-09-15 | Discharge: 2020-09-15 | Disposition: A | Discharge status: Home / Self Care | Payer: No Typology Code available for payment source | Source: Ambulatory Visit | Attending: Neurological Surgery | Admitting: Neurological Surgery

## 2020-09-15 DIAGNOSIS — Z20822 Contact with and (suspected) exposure to covid-19: Secondary | ICD-10-CM | POA: Diagnosis not present

## 2020-09-15 DIAGNOSIS — Z01812 Encounter for preprocedural laboratory examination: Secondary | ICD-10-CM | POA: Insufficient documentation

## 2020-09-15 LAB — SARS CORONAVIRUS 2 (TAT 6-24 HRS): SARS Coronavirus 2: NEGATIVE

## 2020-09-15 LAB — CBC
HCT: 47.6 % — ABNORMAL HIGH (ref 36.0–46.0)
Hemoglobin: 15.7 g/dL — ABNORMAL HIGH (ref 12.0–15.0)
MCH: 31.3 pg (ref 26.0–34.0)
MCHC: 33 g/dL (ref 30.0–36.0)
MCV: 95 fL (ref 80.0–100.0)
Platelets: 332 10*3/uL (ref 150–400)
RBC: 5.01 MIL/uL (ref 3.87–5.11)
RDW: 12.9 % (ref 11.5–15.5)
WBC: 5.6 10*3/uL (ref 4.0–10.5)
nRBC: 0 % (ref 0.0–0.2)

## 2020-09-15 LAB — SURGICAL PCR SCREEN
MRSA, PCR: NEGATIVE
Staphylococcus aureus: NEGATIVE

## 2020-09-15 NOTE — Progress Notes (Signed)
PCP - Christel Mormon, [place] Cardiologist - denies  Chest x-ray - n/a EKG - n/a Stress Test - denies ECHO - denies Cardiac Cath - denies  Sleep Study - denies CPAP - denies  Blood Thinner Instructions: n/a Aspirin Instructions:n/a   COVID TEST- 09/15/20   Anesthesia review: no   Patient denies shortness of breath, fever, cough and chest pain at PAT appointment   All instructions explained to the patient, with a verbal understanding of the material. Patient agrees to go over the instructions while at home for a better understanding. Patient also instructed to self quarantine after being tested for COVID-19. The opportunity to ask questions was provided.    Coronavirus Screening  Have you experienced the following symptoms:  Cough yes/no: No Fever (>100.28F)  yes/no: No Runny nose yes/no: No Sore throat yes/no: No Difficulty breathing/shortness of breath  yes/no: No  Have you or a family member traveled in the last 14 days and where? yes/no: No   If the patient indicates "YES" to the above questions, their PAT will be rescheduled to limit the exposure to others and, the surgeon will be notified. THE PATIENT WILL NEED TO BE ASYMPTOMATIC FOR 14 DAYS.   If the patient is not experiencing any of these symptoms, the PAT nurse will instruct them to NOT bring anyone with them to their appointment since they may have these symptoms or traveled as well.   Please remind your patients and families that hospital visitation restrictions are in effect and the importance of the restrictions.

## 2020-09-18 NOTE — Anesthesia Preprocedure Evaluation (Addendum)
Anesthesia Evaluation  Patient identified by MRN, date of birth, ID band Patient awake    Reviewed: Allergy & Precautions, H&P , NPO status , Patient's Chart, lab work & pertinent test results  Airway Mallampati: II  TM Distance: >3 FB Neck ROM: Full    Dental no notable dental hx. (+) Teeth Intact, Dental Advisory Given   Pulmonary Current Smoker and Patient abstained from smoking.,    Pulmonary exam normal breath sounds clear to auscultation       Cardiovascular Exercise Tolerance: Good negative cardio ROS   Rhythm:Regular Rate:Normal     Neuro/Psych negative neurological ROS  negative psych ROS   GI/Hepatic negative GI ROS, Neg liver ROS,   Endo/Other  negative endocrine ROS  Renal/GU negative Renal ROS  negative genitourinary   Musculoskeletal  (+) Arthritis , Osteoarthritis,    Abdominal   Peds  Hematology  (+) Blood dyscrasia, ,   Anesthesia Other Findings   Reproductive/Obstetrics negative OB ROS                            Anesthesia Physical Anesthesia Plan  ASA: II  Anesthesia Plan: General   Post-op Pain Management:    Induction: Intravenous  PONV Risk Score and Plan: 3 and Ondansetron, Dexamethasone and Midazolam  Airway Management Planned: Oral ETT  Additional Equipment:   Intra-op Plan:   Post-operative Plan: Extubation in OR  Informed Consent: I have reviewed the patients History and Physical, chart, labs and discussed the procedure including the risks, benefits and alternatives for the proposed anesthesia with the patient or authorized representative who has indicated his/her understanding and acceptance.     Dental advisory given  Plan Discussed with: CRNA  Anesthesia Plan Comments:        Anesthesia Quick Evaluation

## 2020-09-19 ENCOUNTER — Encounter (HOSPITAL_COMMUNITY): Payer: Self-pay | Admitting: Neurological Surgery

## 2020-09-19 ENCOUNTER — Ambulatory Visit (HOSPITAL_COMMUNITY): Payer: No Typology Code available for payment source | Admitting: Vascular Surgery

## 2020-09-19 ENCOUNTER — Ambulatory Visit (HOSPITAL_COMMUNITY): Payer: No Typology Code available for payment source

## 2020-09-19 ENCOUNTER — Ambulatory Visit (HOSPITAL_COMMUNITY): Payer: No Typology Code available for payment source | Admitting: Anesthesiology

## 2020-09-19 ENCOUNTER — Ambulatory Visit (HOSPITAL_COMMUNITY)
Admission: RE | Disposition: A | Discharge status: Home / Self Care | Payer: Self-pay | Source: Home / Self Care | Attending: Neurological Surgery

## 2020-09-19 ENCOUNTER — Other Ambulatory Visit: Payer: Self-pay

## 2020-09-19 ENCOUNTER — Observation Stay (HOSPITAL_COMMUNITY)
Admission: RE | Admit: 2020-09-19 | Discharge: 2020-09-21 | Disposition: A | Discharge status: Home / Self Care | Payer: No Typology Code available for payment source | Attending: Neurological Surgery | Admitting: Neurological Surgery

## 2020-09-19 DIAGNOSIS — F17210 Nicotine dependence, cigarettes, uncomplicated: Secondary | ICD-10-CM | POA: Diagnosis not present

## 2020-09-19 DIAGNOSIS — Z79899 Other long term (current) drug therapy: Secondary | ICD-10-CM | POA: Diagnosis not present

## 2020-09-19 DIAGNOSIS — Z419 Encounter for procedure for purposes other than remedying health state, unspecified: Secondary | ICD-10-CM

## 2020-09-19 DIAGNOSIS — Z86711 Personal history of pulmonary embolism: Secondary | ICD-10-CM | POA: Diagnosis not present

## 2020-09-19 DIAGNOSIS — M1990 Unspecified osteoarthritis, unspecified site: Secondary | ICD-10-CM | POA: Insufficient documentation

## 2020-09-19 DIAGNOSIS — M5417 Radiculopathy, lumbosacral region: Principal | ICD-10-CM | POA: Insufficient documentation

## 2020-09-19 DIAGNOSIS — M5416 Radiculopathy, lumbar region: Secondary | ICD-10-CM | POA: Diagnosis present

## 2020-09-19 HISTORY — PX: LUMBAR LAMINECTOMY/DECOMPRESSION MICRODISCECTOMY: SHX5026

## 2020-09-19 SURGERY — LUMBAR LAMINECTOMY/DECOMPRESSION MICRODISCECTOMY 1 LEVEL
Anesthesia: General | Site: Spine Lumbar

## 2020-09-19 MED ORDER — SUGAMMADEX SODIUM 200 MG/2ML IV SOLN
INTRAVENOUS | Status: DC | PRN
  Administered 2020-09-19: 200 mg via INTRAVENOUS

## 2020-09-19 MED ORDER — EPHEDRINE 5 MG/ML INJ
INTRAVENOUS | Status: AC
  Filled 2020-09-19: qty 10

## 2020-09-19 MED ORDER — HYDROMORPHONE HCL 1 MG/ML IJ SOLN
INTRAMUSCULAR | Status: AC
  Filled 2020-09-19: qty 1

## 2020-09-19 MED ORDER — HEMOSTATIC AGENTS (NO CHARGE) OPTIME
TOPICAL | Status: DC | PRN
  Administered 2020-09-19 (×2): 1 via TOPICAL

## 2020-09-19 MED ORDER — CEFAZOLIN SODIUM-DEXTROSE 2-4 GM/100ML-% IV SOLN
2.0000 g | INTRAVENOUS | Status: AC
  Administered 2020-09-19: 2 g via INTRAVENOUS
  Filled 2020-09-19: qty 100

## 2020-09-19 MED ORDER — KETAMINE HCL 50 MG/5ML IJ SOSY
PREFILLED_SYRINGE | INTRAMUSCULAR | Status: AC
  Filled 2020-09-19: qty 5

## 2020-09-19 MED ORDER — 0.9 % SODIUM CHLORIDE (POUR BTL) OPTIME
TOPICAL | Status: DC | PRN
  Administered 2020-09-19: 1000 mL

## 2020-09-19 MED ORDER — HYDROMORPHONE HCL 1 MG/ML IJ SOLN
INTRAMUSCULAR | Status: AC
  Administered 2020-09-19: 0.5 mg via INTRAVENOUS
  Filled 2020-09-19: qty 1

## 2020-09-19 MED ORDER — CHLORHEXIDINE GLUCONATE CLOTH 2 % EX PADS: 6.0000 | MEDICATED_PAD | Freq: Once | CUTANEOUS | Status: DC

## 2020-09-19 MED ORDER — LACTATED RINGERS IV SOLN: INTRAVENOUS | Status: DC

## 2020-09-19 MED ORDER — PHENYLEPHRINE 40 MCG/ML (10ML) SYRINGE FOR IV PUSH (FOR BLOOD PRESSURE SUPPORT)
PREFILLED_SYRINGE | INTRAVENOUS | Status: AC
  Filled 2020-09-19: qty 10

## 2020-09-19 MED ORDER — ROCURONIUM BROMIDE 10 MG/ML (PF) SYRINGE
PREFILLED_SYRINGE | INTRAVENOUS | Status: AC
  Filled 2020-09-19: qty 10

## 2020-09-19 MED ORDER — EPHEDRINE SULFATE-NACL 50-0.9 MG/10ML-% IV SOSY
PREFILLED_SYRINGE | INTRAVENOUS | Status: DC | PRN
  Administered 2020-09-19: 5 mg via INTRAVENOUS

## 2020-09-19 MED ORDER — POLYETHYLENE GLYCOL 3350 17 G PO PACK: 17.0000 g | PACK | Freq: Every day | ORAL | Status: DC | PRN

## 2020-09-19 MED ORDER — PREGABALIN 75 MG PO CAPS
75.0000 mg | ORAL_CAPSULE | Freq: Two times a day (BID) | ORAL | Status: DC
  Administered 2020-09-19 – 2020-09-21 (×4): 75 mg via ORAL
  Filled 2020-09-19 (×4): qty 1

## 2020-09-19 MED ORDER — KETAMINE HCL 10 MG/ML IJ SOLN
INTRAMUSCULAR | Status: DC | PRN
  Administered 2020-09-19: 10 mg via INTRAVENOUS
  Administered 2020-09-19: 30 mg via INTRAVENOUS

## 2020-09-19 MED ORDER — MENTHOL 3 MG MT LOZG: 1.0000 | LOZENGE | OROMUCOSAL | Status: DC | PRN

## 2020-09-19 MED ORDER — DEXAMETHASONE SODIUM PHOSPHATE 10 MG/ML IJ SOLN
INTRAMUSCULAR | Status: AC
  Filled 2020-09-19: qty 1

## 2020-09-19 MED ORDER — LIDOCAINE 2% (20 MG/ML) 5 ML SYRINGE
INTRAMUSCULAR | Status: AC
  Filled 2020-09-19: qty 5

## 2020-09-19 MED ORDER — LIDOCAINE-EPINEPHRINE 1 %-1:100000 IJ SOLN
INTRAMUSCULAR | Status: AC
  Filled 2020-09-19: qty 1

## 2020-09-19 MED ORDER — BUPIVACAINE HCL (PF) 0.5 % IJ SOLN
INTRAMUSCULAR | Status: AC
  Filled 2020-09-19: qty 30

## 2020-09-19 MED ORDER — CHLORHEXIDINE GLUCONATE 0.12 % MT SOLN
15.0000 mL | Freq: Once | OROMUCOSAL | Status: AC
  Administered 2020-09-19: 15 mL via OROMUCOSAL
  Filled 2020-09-19: qty 15

## 2020-09-19 MED ORDER — HYDROMORPHONE HCL 1 MG/ML IJ SOLN
0.2500 mg | INTRAMUSCULAR | Status: DC | PRN
  Administered 2020-09-19: 0.5 mg via INTRAVENOUS

## 2020-09-19 MED ORDER — ROCURONIUM BROMIDE 10 MG/ML (PF) SYRINGE
PREFILLED_SYRINGE | INTRAVENOUS | Status: DC | PRN
  Administered 2020-09-19 (×2): 20 mg via INTRAVENOUS
  Administered 2020-09-19: 60 mg via INTRAVENOUS
  Administered 2020-09-19: 20 mg via INTRAVENOUS

## 2020-09-19 MED ORDER — THROMBIN 5000 UNITS EX SOLR
CUTANEOUS | Status: AC
  Filled 2020-09-19: qty 5000

## 2020-09-19 MED ORDER — ACETAMINOPHEN 500 MG PO TABS
1000.0000 mg | ORAL_TABLET | Freq: Once | ORAL | Status: AC
  Administered 2020-09-19: 1000 mg via ORAL
  Filled 2020-09-19: qty 2

## 2020-09-19 MED ORDER — TIZANIDINE HCL 4 MG PO TABS
4.0000 mg | ORAL_TABLET | Freq: Three times a day (TID) | ORAL | Status: DC | PRN
  Administered 2020-09-20 – 2020-09-21 (×3): 4 mg via ORAL
  Filled 2020-09-19 (×3): qty 1

## 2020-09-19 MED ORDER — HYDROMORPHONE HCL 1 MG/ML IJ SOLN
1.0000 mg | INTRAMUSCULAR | Status: DC | PRN
  Administered 2020-09-19 – 2020-09-20 (×5): 1 mg via INTRAVENOUS
  Filled 2020-09-19 (×4): qty 1

## 2020-09-19 MED ORDER — DEXAMETHASONE SODIUM PHOSPHATE 10 MG/ML IJ SOLN
INTRAMUSCULAR | Status: DC | PRN
  Administered 2020-09-19: 4 mg via INTRAVENOUS

## 2020-09-19 MED ORDER — SODIUM CHLORIDE 0.9% FLUSH
3.0000 mL | Freq: Two times a day (BID) | INTRAVENOUS | Status: DC
  Administered 2020-09-19 – 2020-09-20 (×3): 3 mL via INTRAVENOUS

## 2020-09-19 MED ORDER — OXYCODONE HCL ER 10 MG PO T12A
10.0000 mg | EXTENDED_RELEASE_TABLET | Freq: Two times a day (BID) | ORAL | Status: DC
  Administered 2020-09-19 – 2020-09-21 (×5): 10 mg via ORAL
  Filled 2020-09-19 (×5): qty 1

## 2020-09-19 MED ORDER — ONDANSETRON HCL 4 MG/2ML IJ SOLN
INTRAMUSCULAR | Status: AC
  Filled 2020-09-19: qty 2

## 2020-09-19 MED ORDER — ORAL CARE MOUTH RINSE: 15.0000 mL | Freq: Once | OROMUCOSAL | Status: AC

## 2020-09-19 MED ORDER — SODIUM CHLORIDE 0.9% FLUSH: 3.0000 mL | INTRAVENOUS | Status: DC | PRN

## 2020-09-19 MED ORDER — DEXMEDETOMIDINE (PRECEDEX) IN NS 20 MCG/5ML (4 MCG/ML) IV SYRINGE
PREFILLED_SYRINGE | INTRAVENOUS | Status: AC
  Filled 2020-09-19: qty 5

## 2020-09-19 MED ORDER — PHENYLEPHRINE 40 MCG/ML (10ML) SYRINGE FOR IV PUSH (FOR BLOOD PRESSURE SUPPORT)
PREFILLED_SYRINGE | INTRAVENOUS | Status: DC | PRN
  Administered 2020-09-19: 120 ug via INTRAVENOUS
  Administered 2020-09-19: 80 ug via INTRAVENOUS

## 2020-09-19 MED ORDER — PHENYLEPHRINE HCL-NACL 10-0.9 MG/250ML-% IV SOLN
INTRAVENOUS | Status: DC | PRN
  Administered 2020-09-19: 25 ug/min via INTRAVENOUS

## 2020-09-19 MED ORDER — LIDOCAINE-EPINEPHRINE 1 %-1:100000 IJ SOLN
INTRAMUSCULAR | Status: DC | PRN
  Administered 2020-09-19: 10 mL

## 2020-09-19 MED ORDER — FENTANYL CITRATE (PF) 250 MCG/5ML IJ SOLN
INTRAMUSCULAR | Status: AC
  Filled 2020-09-19: qty 5

## 2020-09-19 MED ORDER — OXYCODONE HCL 5 MG PO TABS: 5.0000 mg | ORAL_TABLET | ORAL | Status: DC | PRN

## 2020-09-19 MED ORDER — ACETAMINOPHEN 325 MG PO TABS
650.0000 mg | ORAL_TABLET | ORAL | Status: DC | PRN
  Administered 2020-09-20 – 2020-09-21 (×2): 650 mg via ORAL
  Filled 2020-09-19 (×2): qty 2

## 2020-09-19 MED ORDER — ONDANSETRON HCL 4 MG PO TABS: 4.0000 mg | ORAL_TABLET | Freq: Four times a day (QID) | ORAL | Status: DC | PRN

## 2020-09-19 MED ORDER — DOCUSATE SODIUM 100 MG PO CAPS
100.0000 mg | ORAL_CAPSULE | Freq: Two times a day (BID) | ORAL | Status: DC
  Administered 2020-09-19 – 2020-09-21 (×4): 100 mg via ORAL
  Filled 2020-09-19 (×4): qty 1

## 2020-09-19 MED ORDER — CEFAZOLIN SODIUM-DEXTROSE 2-4 GM/100ML-% IV SOLN
2.0000 g | Freq: Three times a day (TID) | INTRAVENOUS | Status: AC
  Administered 2020-09-19 – 2020-09-20 (×2): 2 g via INTRAVENOUS
  Filled 2020-09-19 (×2): qty 100

## 2020-09-19 MED ORDER — MIDAZOLAM HCL 5 MG/5ML IJ SOLN
INTRAMUSCULAR | Status: DC | PRN
  Administered 2020-09-19: 2 mg via INTRAVENOUS

## 2020-09-19 MED ORDER — PHENOL 1.4 % MT LIQD: 1.0000 | OROMUCOSAL | Status: DC | PRN

## 2020-09-19 MED ORDER — THROMBIN (RECOMBINANT) 5000 UNITS EX SOLR
CUTANEOUS | Status: AC
  Filled 2020-09-19: qty 5000

## 2020-09-19 MED ORDER — FENTANYL CITRATE (PF) 250 MCG/5ML IJ SOLN
INTRAMUSCULAR | Status: DC | PRN
Start: 2020-09-19 — End: 2020-09-19
  Administered 2020-09-19 (×4): 50 ug via INTRAVENOUS

## 2020-09-19 MED ORDER — PROPOFOL 10 MG/ML IV BOLUS
INTRAVENOUS | Status: AC
  Filled 2020-09-19: qty 40

## 2020-09-19 MED ORDER — ONDANSETRON HCL 4 MG/2ML IJ SOLN
INTRAMUSCULAR | Status: DC | PRN
  Administered 2020-09-19: 4 mg via INTRAVENOUS

## 2020-09-19 MED ORDER — LIDOCAINE 2% (20 MG/ML) 5 ML SYRINGE
INTRAMUSCULAR | Status: DC | PRN
  Administered 2020-09-19: 40 mg via INTRAVENOUS

## 2020-09-19 MED ORDER — DEXMEDETOMIDINE (PRECEDEX) IN NS 20 MCG/5ML (4 MCG/ML) IV SYRINGE
PREFILLED_SYRINGE | INTRAVENOUS | Status: DC | PRN
  Administered 2020-09-19 (×2): 8 ug via INTRAVENOUS
  Administered 2020-09-19: 4 ug via INTRAVENOUS

## 2020-09-19 MED ORDER — METHYLPREDNISOLONE ACETATE 80 MG/ML IJ SUSP
INTRAMUSCULAR | Status: AC
  Filled 2020-09-19: qty 1

## 2020-09-19 MED ORDER — SODIUM CHLORIDE 0.9 % IV SOLN: 250.0000 mL | INTRAVENOUS | Status: DC

## 2020-09-19 MED ORDER — PROPOFOL 10 MG/ML IV BOLUS
INTRAVENOUS | Status: DC | PRN
  Administered 2020-09-19: 10 mg via INTRAVENOUS

## 2020-09-19 MED ORDER — MIDAZOLAM HCL 2 MG/2ML IJ SOLN
INTRAMUSCULAR | Status: AC
  Filled 2020-09-19: qty 2

## 2020-09-19 MED ORDER — ONDANSETRON HCL 4 MG/2ML IJ SOLN: 4.0000 mg | Freq: Four times a day (QID) | INTRAMUSCULAR | Status: DC | PRN

## 2020-09-19 MED ORDER — ACETAMINOPHEN 650 MG RE SUPP: 650.0000 mg | RECTAL | Status: DC | PRN

## 2020-09-19 MED ORDER — OXYCODONE HCL 5 MG PO TABS
10.0000 mg | ORAL_TABLET | ORAL | Status: DC | PRN
  Administered 2020-09-19 – 2020-09-21 (×10): 10 mg via ORAL
  Filled 2020-09-19 (×10): qty 2

## 2020-09-19 SURGICAL SUPPLY — 62 items
ADH SKN CLS APL DERMABOND .7 (GAUZE/BANDAGES/DRESSINGS) ×1
BAG DECANTER FOR FLEXI CONT (MISCELLANEOUS) ×2 IMPLANT
BAND INSRT 18 STRL LF DISP RB (MISCELLANEOUS) ×2
BAND RUBBER #18 3X1/16 STRL (MISCELLANEOUS) ×4 IMPLANT
BLADE CLIPPER SURG (BLADE) IMPLANT
BLADE SURG 11 STRL SS (BLADE) ×2 IMPLANT
BUR MATCHSTICK NEURO 3.0 LAGG (BURR) ×1 IMPLANT
BUR PRECISION FLUTE 5.0 (BURR) ×1 IMPLANT
CANISTER SUCT 3000ML PPV (MISCELLANEOUS) ×2 IMPLANT
COVER WAND RF STERILE (DRAPES) ×2 IMPLANT
DECANTER SPIKE VIAL GLASS SM (MISCELLANEOUS) ×2 IMPLANT
DERMABOND ADVANCED (GAUZE/BANDAGES/DRESSINGS) ×1
DERMABOND ADVANCED .7 DNX12 (GAUZE/BANDAGES/DRESSINGS) ×1 IMPLANT
DRAPE C-ARM 42X72 X-RAY (DRAPES) ×4 IMPLANT
DRAPE LAPAROTOMY 100X72X124 (DRAPES) ×2 IMPLANT
DRAPE MICROSCOPE LEICA (MISCELLANEOUS) ×2 IMPLANT
DRAPE SURG 17X23 STRL (DRAPES) ×2 IMPLANT
DURAPREP 26ML APPLICATOR (WOUND CARE) ×2 IMPLANT
ELECT BLADE 4.0 EZ CLEAN MEGAD (MISCELLANEOUS) ×2
ELECT REM PT RETURN 9FT ADLT (ELECTROSURGICAL) ×2
ELECTRODE BLDE 4.0 EZ CLN MEGD (MISCELLANEOUS) IMPLANT
ELECTRODE REM PT RTRN 9FT ADLT (ELECTROSURGICAL) ×1 IMPLANT
GAUZE 4X4 16PLY RFD (DISPOSABLE) IMPLANT
GAUZE SPONGE 4X4 12PLY STRL (GAUZE/BANDAGES/DRESSINGS) IMPLANT
GLOVE BIO SURGEON STRL SZ 6.5 (GLOVE) ×2 IMPLANT
GLOVE BIO SURGEON STRL SZ7.5 (GLOVE) ×2 IMPLANT
GLOVE BIOGEL PI IND STRL 6.5 (GLOVE) ×1 IMPLANT
GLOVE BIOGEL PI IND STRL 7.5 (GLOVE) ×1 IMPLANT
GLOVE BIOGEL PI IND STRL 8 (GLOVE) IMPLANT
GLOVE BIOGEL PI INDICATOR 6.5 (GLOVE) ×1
GLOVE BIOGEL PI INDICATOR 7.5 (GLOVE) ×1
GLOVE BIOGEL PI INDICATOR 8 (GLOVE) ×3
GLOVE EXAM NITRILE LRG STRL (GLOVE) IMPLANT
GLOVE EXAM NITRILE XL STR (GLOVE) IMPLANT
GLOVE EXAM NITRILE XS STR PU (GLOVE) IMPLANT
GOWN STRL REUS W/ TWL LRG LVL3 (GOWN DISPOSABLE) ×2 IMPLANT
GOWN STRL REUS W/ TWL XL LVL3 (GOWN DISPOSABLE) IMPLANT
GOWN STRL REUS W/TWL 2XL LVL3 (GOWN DISPOSABLE) ×2 IMPLANT
GOWN STRL REUS W/TWL LRG LVL3 (GOWN DISPOSABLE) ×2
GOWN STRL REUS W/TWL XL LVL3 (GOWN DISPOSABLE) ×2
HEMOSTAT POWDER KIT SURGIFOAM (HEMOSTASIS) ×2 IMPLANT
KIT BASIN OR (CUSTOM PROCEDURE TRAY) ×2 IMPLANT
KIT TURNOVER KIT B (KITS) ×2 IMPLANT
NDL HYPO 18GX1.5 BLUNT FILL (NEEDLE) IMPLANT
NDL SPNL 18GX3.5 QUINCKE PK (NEEDLE) ×1 IMPLANT
NEEDLE HYPO 18GX1.5 BLUNT FILL (NEEDLE) IMPLANT
NEEDLE HYPO 22GX1.5 SAFETY (NEEDLE) ×2 IMPLANT
NEEDLE SPNL 18GX3.5 QUINCKE PK (NEEDLE) ×2 IMPLANT
NS IRRIG 1000ML POUR BTL (IV SOLUTION) ×2 IMPLANT
PACK LAMINECTOMY NEURO (CUSTOM PROCEDURE TRAY) ×2 IMPLANT
PAD ARMBOARD 7.5X6 YLW CONV (MISCELLANEOUS) ×6 IMPLANT
SEALANT ADHERUS EXTEND TIP (MISCELLANEOUS) ×1 IMPLANT
SPONGE LAP 4X18 RFD (DISPOSABLE) IMPLANT
SUT MNCRL AB 3-0 PS2 18 (SUTURE) ×2 IMPLANT
SUT PROLENE 6 0 BV (SUTURE) ×2 IMPLANT
SUT VIC AB 0 CT1 18XCR BRD8 (SUTURE) ×1 IMPLANT
SUT VIC AB 0 CT1 8-18 (SUTURE) ×2
SUT VIC AB 2-0 CT2 18 VCP726D (SUTURE) ×2 IMPLANT
SYR 3ML LL SCALE MARK (SYRINGE) IMPLANT
TOWEL GREEN STERILE (TOWEL DISPOSABLE) ×2 IMPLANT
TOWEL GREEN STERILE FF (TOWEL DISPOSABLE) ×2 IMPLANT
WATER STERILE IRR 1000ML POUR (IV SOLUTION) ×2 IMPLANT

## 2020-09-19 NOTE — Anesthesia Postprocedure Evaluation (Signed)
Anesthesia Post Note  Patient: [name] [name]  Procedure(s) Performed: Lumbar five Laminectomy with exploration of Lumbar five Sacral one Nerve roots (N/A Spine Lumbar)     Patient location during evaluation: PACU Anesthesia Type: General Level of consciousness: awake and alert Pain management: pain level controlled Vital Signs Assessment: post-procedure vital signs reviewed and stable Respiratory status: spontaneous breathing, nonlabored ventilation and respiratory function stable Cardiovascular status: blood pressure returned to baseline and stable Postop Assessment: no apparent nausea or vomiting Anesthetic complications: no   No complications documented.  Last Vitals:  Vitals:   09/19/20 1225 09/19/20 1303  BP: 111/80 113/78  Pulse: 79 89  Resp: 18 18  Temp: 36.8 C 36.6 C  SpO2: 91% 95%                  Dawson Hollman,W. EDMOND

## 2020-09-19 NOTE — Progress Notes (Signed)
PT Cancellation Note  Patient Details Name: [name] [name] MRN: [id number] DOB: August 03, 1971   Cancelled Treatment:    Reason Eval/Treat Not Completed: Medical issues which prohibited therapy Will follow up as pt medically ready and as schedule allows.   Lou Miner, DPT  Acute Rehabilitation Services  Pager: [phone] Office: [phone]    Rudean Hitt 09/19/2020, 1:38 PM

## 2020-09-19 NOTE — Op Note (Signed)
PATIENT: [name] [name]  DAY OF SURGERY: 09/19/20   PRE-OPERATIVE DIAGNOSIS:  Lumbar radiculopathy   POST-OPERATIVE DIAGNOSIS:  Same   PROCEDURE:  L5 laminectomy   SURGEON:  Surgeon(s) and Role:    Judith Part, MD - Primary    Osie Cheeks, NP - Assisting   ANESTHESIA: ETGA   BRIEF HISTORY: This is a 49 year old woman who presented with continued LLE pain and weakness after multiple prior lumbar surgeries. She had some compressive material in the left foramen and left lateral recess at L5-S1. I therefore offered repeat decompression and exploration to see if it is possible to improve her symptoms. This was discussed with the patient as well as risks, benefits, and alternatives and wished to proceed with surgery.   OPERATIVE DETAIL: The patient was taken to the operating room and anesthesia was induced by the anesthesia team. They were placed on the OR table in the prone position with padding of all pressure points. A formal time out was performed with two patient identifiers and confirmed the operative site. The operative site was marked, hair was clipped with surgical clippers, the area was then prepped and draped in a sterile fashion. The prior incision was opened and soft tissues were dissected. There was, as expected a laminar defect on the left just inferior to the pars. Significant scar tissue was present and dissected free. During this process, a durotomy was made with CSF leak. This was sutured with interrupted 6-0 Prolene without further leak. Fluoro was used to confirm the operative level as well as the bony anatomy present in the field.    The prior L5 laminotomy was expanded on the left. The thecal sac was identified along with the traversing root. After clearing scar tissue, it was clear that the CSF leak appeared to be in the nerve root axilla, but it was fortunately not leaking any further. The traversing root was dissected free. The exiting root was still significantly  scarred in and difficult to dissect free. I therefore located the pedicles and foramen and expanded the foraminotomy until it felt decompressed. Adheris ([employer]) dural sealant was applied over the laminotomy defect due to the prior CSF leak and repair. Hemostasis was confirmed, the incision was copiously irrigated, and the incision was closed with multiple water-tight layers.   All instrument and sponge counts were correct. The patient was then returned to anesthesia for emergence.    EBL:  31mL   DRAINS: none   SPECIMENS: none   Judith Part, MD 09/19/20 8:29 AM

## 2020-09-19 NOTE — Transfer of Care (Signed)
Immediate Anesthesia Transfer of Care Note  Patient: [name] [name]  Procedure(s) Performed: Lumbar five Laminectomy with exploration of Lumbar five Sacral one Nerve roots (N/A Spine Lumbar)  Patient Location: PACU  Anesthesia Type:General  Level of Consciousness: awake, alert , oriented and patient cooperative  Airway & Oxygen Therapy: Patient Spontanous Breathing and Patient connected to nasal cannula oxygen  Post-op Assessment: Report given to RN and Post -op Vital signs reviewed and stable  Post vital signs: Reviewed and stable  Last Vitals:  Vitals Value Taken Time  BP 119/82 09/19/20 1142  Temp    Pulse 93 09/19/20 1145  Resp 11 09/19/20 1145  SpO2 97 % 09/19/20 1145  Vitals shown include unvalidated device data.  Last Pain:  Vitals:   09/19/20 0611  TempSrc:   PainSc: 6       Patients Stated Pain Goal: 3 (20/03/79 4446)  Complications: No complications documented.

## 2020-09-19 NOTE — Progress Notes (Signed)
Neurosurgery Service Post-operative progress note  Assessment & Plan: 49 y.o. woman s/p repeat L5-S1 decompression / exploration w/ CSF leak, seen in PACU and then on 3C, her sensation is improved from preop - S1 numbness has almost completely resolved. Strength exam is stable distally, worse proximally in the hip flexors, but appears significantly pain limited. Her radicular pain feels better than preop, thus far.  -can hold off on PT tonight, mobilize in AM  [employer]  09/19/20 7:35 PM

## 2020-09-19 NOTE — H&P (Signed)
Surgical H&P Update  HPI: 49 y.o. woman with a history of prior, here for left foot pain and weakness. She has had multiple prior surgeries with residual symptoms, EMG showing L5 and S1 motor drop out that correlated with her symptoms. No changes in health since she was last seen. Still having LLE pain and wishes to proceed with surgery.  PMHx:  Past Medical History:  Diagnosis Date  . Arthritis   . Chronic back pain    stenosis and HNP  . History of pulmonary embolus (PE) 03/2008  . Pneumonia    "walking" in the 90's  . Protein C deficiency ([place])   . Urinary urgency   . Weakness    numbness and tingling on left side   FamHx: History reviewed. No pertinent family history. SocHx:  reports that she has been smoking cigarettes. She has a 12.50 pack-year smoking history. She has never used smokeless tobacco. She reports previous alcohol use. She reports that she does not use drugs.  Physical Exam: Strength 5/5 in BUE and RLE; LLE 5/5 proximally and 3/5 in PF/DF/EHL, SILTx4 except L L5>S1 numbness  Assesment/Plan: 49 y.o. woman with L5 and S1 radiculopathy, here for L5 laminectomy and exploration and repeat decompression. Risks, benefits, and alternatives discussed and the patient would like to continue with surgery. In addition to the normal risks of surgery, we extensively discussed the significant likelihood of lack of improvement in her symptoms given her multiple prior surgeries and long standing symptoms.  -OR today -3C post-op  Judith Part, MD 09/19/20 8:17 AM

## 2020-09-19 NOTE — Anesthesia Procedure Notes (Signed)
Procedure Name: Intubation Date/Time: 09/19/2020 8:40 AM Performed by: Colin Benton, CRNA Pre-anesthesia Checklist: Patient identified, Emergency Drugs available, Suction available and Patient being monitored Patient Re-evaluated:Patient Re-evaluated prior to induction Oxygen Delivery Method: Circle system utilized Preoxygenation: Pre-oxygenation with 100% oxygen Induction Type: IV induction Ventilation: Mask ventilation without difficulty Laryngoscope Size: Mac and 3 Grade View: Grade I Tube type: Oral Tube size: 7.0 mm Number of attempts: 1 Airway Equipment and Method: Stylet Placement Confirmation: ETT inserted through vocal cords under direct vision,  positive ETCO2 and breath sounds checked- equal and bilateral Secured at: 22 cm Tube secured with: Tape Dental Injury: Teeth and Oropharynx as per pre-operative assessment

## 2020-09-20 ENCOUNTER — Encounter (HOSPITAL_COMMUNITY): Payer: Self-pay | Admitting: Neurological Surgery

## 2020-09-20 DIAGNOSIS — M5417 Radiculopathy, lumbosacral region: Secondary | ICD-10-CM | POA: Diagnosis not present

## 2020-09-20 MED FILL — Thrombin (Recombinant) For Soln 5000 Unit: CUTANEOUS | Qty: 5000 | Status: AC

## 2020-09-20 NOTE — Progress Notes (Signed)
Providing Compassionate, Quality Care - Together  Neurosurgery Service Progress Note  Subjective: No acute events overnight. No new complaints this moring  Objective: Vitals:   09/20/20 0730 09/20/20 1114 09/20/20 1116 09/20/20 1454  BP: 101/66 (!) 87/62 (!) 87/61 102/64  Pulse: 67  69 84  Resp: 18  18 18   Temp: 97.9 F (36.6 C)  98.4 F (36.9 C) 98.3 F (36.8 C)  TempSrc: Oral  Oral Oral  SpO2: 95%  96% 97%  Weight:      Height:       Temp (24hrs), Avg:98 F (36.7 C), Min:97.5 F (36.4 C), Max:98.4 F (36.9 C)  CBC Latest Ref Rng & Units 09/15/2020 09/12/2019 08/11/2018  WBC 4.0 - 10.5 K/uL 5.6 7.4 6.3  Hemoglobin 12.0 - 15.0 g/dL 15.7(H) 14.5 15.2(H)  Hematocrit 36 - 46 % 47.6(H) 42.9 45.3  Platelets 150 - 400 K/uL 332 316 314   BMP Latest Ref Rng & Units 09/12/2019 08/14/2018 02/02/2017  Glucose 70 - 99 mg/dL 85 105(H) 117(H)  BUN 6 - 20 mg/dL 6 9 12   Creatinine 0.44 - 1.00 mg/dL 0.76 0.86 0.91  Sodium 135 - 145 mmol/L 136 139 137  Potassium 3.5 - 5.1 mmol/L 4.3 4.8 4.3  Chloride 98 - 111 mmol/L 100 108 103  CO2 22 - 32 mmol/L 22 22 23   Calcium 8.9 - 10.3 mg/dL 8.9 9.0 9.3    Intake/Output Summary (Last 24 hours) at 09/20/2020 1626 Last data filed at 09/20/2020 1310 Gross per 24 hour  Intake 360 ml  Output --  Net 360 ml    Current Facility-Administered Medications:  .  0.9 %  sodium chloride infusion, 250 mL, Intravenous, Continuous, Ostergard, Thomas A, MD .  acetaminophen (TYLENOL) tablet 650 mg, 650 mg, Oral, Q4H PRN **OR** acetaminophen (TYLENOL) suppository 650 mg, 650 mg, Rectal, Q4H PRN, Ostergard, Thomas A, MD .  docusate sodium (COLACE) capsule 100 mg, 100 mg, Oral, BID, Judith Part, MD, 100 mg at 09/20/20 0925 .  HYDROmorphone (DILAUDID) injection 1 mg, 1 mg, Intravenous, Q3H PRN, Judith Part, MD, 1 mg at 09/20/20 1529 .  menthol-cetylpyridinium (CEPACOL) lozenge 3 mg, 1 lozenge, Oral, PRN **OR** phenol (CHLORASEPTIC) mouth spray 1  spray, 1 spray, Mouth/Throat, PRN, Ostergard, Thomas A, MD .  ondansetron (ZOFRAN) tablet 4 mg, 4 mg, Oral, Q6H PRN **OR** ondansetron (ZOFRAN) injection 4 mg, 4 mg, Intravenous, Q6H PRN, Ostergard, Thomas A, MD .  oxyCODONE (Oxy IR/ROXICODONE) immediate release tablet 10 mg, 10 mg, Oral, Q4H PRN, Judith Part, MD, 10 mg at 09/20/20 1323 .  oxyCODONE (Oxy IR/ROXICODONE) immediate release tablet 5 mg, 5 mg, Oral, Q4H PRN, Judith Part, MD .  oxyCODONE (OXYCONTIN) 12 hr tablet 10 mg, 10 mg, Oral, Q12H, Ostergard, Joyice Faster, MD, 10 mg at 09/20/20 0925 .  polyethylene glycol (MIRALAX / GLYCOLAX) packet 17 g, 17 g, Oral, Daily PRN, Ostergard, Thomas A, MD .  pregabalin (LYRICA) capsule 75 mg, 75 mg, Oral, BID, Judith Part, MD, 75 mg at 09/20/20 0924 .  sodium chloride flush (NS) 0.9 % injection 3 mL, 3 mL, Intravenous, Q12H, Ostergard, Joyice Faster, MD, 3 mL at 09/20/20 0927 .  sodium chloride flush (NS) 0.9 % injection 3 mL, 3 mL, Intravenous, PRN, Ostergard, Thomas A, MD .  tiZANidine (ZANAFLEX) tablet 4 mg, 4 mg, Oral, TID PRN, Judith Part, MD, 4 mg at 09/20/20 1335   Physical Exam: AOx3, follow commands, moves all extremities. Strength test: RLE: 5/5,  LLE: 3/5 plantar flexion/dorsiflexion, unable to perform hip flexors due to pain. SILT x4. Radicular pain is still the same from yesterday.   Assessment & Plan: 49 y.o. woman with a history of  left foot pain and weakness.  S/p repeat L5-S1 decompression/exploration with CSF leak.   -work with PT/OT -SCDs/TED hose -continue pain meds regimen -possible discharge tomorrow  Osie Cheeks, NP 09/20/20 4:26 PM

## 2020-09-20 NOTE — Evaluation (Signed)
Occupational Therapy Evaluation Patient Details Name: [name] [name] MRN: [id number] DOB: 25-Sep-1971 Today's Date: 09/20/2020    History of Present Illness 49yo female s/p repeat L5-S1 laminectomy and exploration of L5-S1 nerve roots with CSF leak. PMH urinary incontinence, chronic back pain, hx multiple lumbar laminectomies, knee arthroscopy, lumbar fusion   Clinical Impression   Patient is s/p L5-s1 surgery resulting in functional limitations due to the deficits listed below (see OT problem list). Pt limited by pain and needs (A) to progress 3 steps with RW. Recommend use of 3n1 at bedside at this time.  Patient will benefit from skilled OT acutely to increase independence and safety with ADLS to allow discharge [place].     Follow Up Recommendations  Home health OT    Equipment Recommendations  None recommended by OT    Recommendations for Other Services       Precautions / Restrictions Precautions Precautions: Back;Fall Precaution Booklet Issued:  (discussed and practiced lumbar precautions) Precaution Comments: handout provided for back precautions with adls Restrictions Weight Bearing Restrictions: No      Mobility Bed Mobility Overal bed mobility: Needs Assistance Bed Mobility: Sit to Supine   Sidelying to sit: Mod assist   Sit to supine: Mod assist Sit to sidelying: Mod assist General bed mobility comments: pt needs mod cues fo sequence and (A) to place bi le onto bed surface  Transfers Overall transfer level: Needs assistance Equipment used: Rolling walker (2 wheeled) Transfers: Sit to/from Stand Sit to Stand: Mod assist         General transfer comment: ModA to boost to upright standing position, very anxious and needed cues for relaxation. Heavy weight shift to RLE.    Balance Overall balance assessment: History of Falls;Needs assistance Sitting-balance support: Bilateral upper extremity supported;Feet supported Sitting balance-Leahy Scale:  Fair Sitting balance - Comments: did not challenge dynamically   Standing balance support: Bilateral upper extremity supported;During functional activity Standing balance-Leahy Scale: Fair Standing balance comment: heavy UE reliance on RW but able to maintain balance with U UE support on RW as well                           ADL either performed or assessed with clinical judgement   ADL Overall ADL's : Needs assistance/impaired Eating/Feeding: Modified independent   Grooming: Modified independent;Bed level   Upper Body Bathing: Moderate assistance;Bed level   Lower Body Bathing: Moderate assistance;Sit to/from stand         Lower Body Dressing Details (indicate cue type and reason): reports aid does all LB dressing and no use of AE. pt reports "they can help me do it" will defer this education due to patient expressed no need due to aid provided  Toilet Transfer: Moderate assistance           Functional mobility during ADLs: Rolling walker;Min guard       Vision Baseline Vision/History: Wears glasses Wears Glasses: At all times       Perception     Praxis      Pertinent Vitals/Pain Pain Assessment: Faces Faces Pain Scale: Hurts worst Pain Location: back and radicular pain down legs Pain Descriptors / Indicators: Radiating;Stabbing;Sharp;Tightness Pain Intervention(s): Monitored during session;Premedicated before session;Repositioned     Hand Dominance Right   Extremity/Trunk Assessment Upper Extremity Assessment Upper Extremity Assessment: Overall WFL for tasks assessed   Lower Extremity Assessment Lower Extremity Assessment: Defer to PT evaluation RLE Deficits / Details: functionally weak, estimate  about 4- to 4/5 with functional tasks RLE Sensation: decreased light touch RLE Coordination: WNL LLE Deficits / Details: able to adduct knees when cued, reports inability to extend knee but during session was observed and demonstrates with functional  task. weakness noted in quad activation LLE: Unable to fully assess due to pain LLE Sensation: decreased proprioception;decreased light touch LLE Coordination: decreased fine motor   Cervical / Trunk Assessment Cervical / Trunk Assessment: Other exceptions Cervical / Trunk Exceptions: s/p lumbar surgery, hx of multiple lumbar surgeries   Communication Communication Communication: No difficulties   Cognition Arousal/Alertness: Awake/alert Behavior During Therapy: Anxious Overall Cognitive Status: Within Functional Limits for tasks assessed                                 General Comments: pt reports pain most limiting and L hand IV constant distraction for inability to progress with therapy   General Comments       Exercises     Shoulder Instructions      Home Living Family/patient expects to be discharged to:: Private residence Living Arrangements: Children;Non-relatives/Friends;Other (Comment) Available Help at Discharge: Friend(s);Personal care attendant;Available PRN/intermittently Type of Home: House Home Access: Stairs to enter [employer] of Steps: 3 with wall to brace on on the right side Entrance Stairs-Rails: None Home Layout: One level     Bathroom Shower/Tub: Tub/shower unit;Curtain   Bathroom Toilet: Standard Bathroom Accessibility: Yes   Home Equipment: Environmental consultant - 4 wheels;Cane - single point;Tub bench;Hand held shower head;Bedside commode (pending a power community w/c)   Additional Comments: aide from 6-10 7 days per week; current BSC is broken; MD is working on getting her Transport planner for community mobility      Prior Functioning/Environment Level of Independence: Needs assistance  Gait / Transfers Assistance Needed: uses SPC and rollator, reports she could walk 15-20 minutes before surgery but very slow ADL's / Homemaking Assistance Needed: aide assist with lower body dressing and showers            OT Problem List:  Decreased strength;Decreased activity tolerance;Impaired balance (sitting and/or standing);Decreased safety awareness;Decreased knowledge of use of DME or AE;Decreased knowledge of precautions;Pain      OT Treatment/Interventions: Self-care/ADL training;DME and/or AE instruction;Therapeutic activities;Cognitive remediation/compensation;Patient/family education;Balance training;Therapeutic exercise;Neuromuscular education;Energy conservation;Manual therapy    OT Goals(Current goals can be found in the care plan section) Acute Rehab OT Goals Patient Stated Goal: to have less pain and L leg to work OT Goal Formulation: With patient Time For Goal Achievement: 10/04/20 Potential to Achieve Goals: Good  OT Frequency: Min 2X/week   Barriers to D/C:            Co-evaluation              AM-PAC OT "6 Clicks" Daily Activity     Outcome Measure Help from another person eating meals?: None Help from another person taking care of personal grooming?: None Help from another person toileting, which includes using toliet, bedpan, or urinal?: A Lot Help from another person bathing (including washing, rinsing, drying)?: A Lot Help from another person to put on and taking off regular upper body clothing?: A Little Help from another person to put on and taking off regular lower body clothing?: A Lot 6 Click Score: 17   End of Session Equipment Utilized During Treatment: Gait belt;Rolling walker Nurse Communication: Mobility status;Precautions  Activity Tolerance: Patient tolerated treatment well Patient left: in bed;with  call bell/phone within reach  OT Visit Diagnosis: Unsteadiness on feet (R26.81);Muscle weakness (generalized) (M62.81);Pain Pain - Right/Left: Left Pain - part of body: Leg                Time: 1002-1026 OT Time Calculation (min): 24 min Charges:  OT General Charges $OT Visit: 1 Visit OT Evaluation $OT Eval Moderate Complexity: 1 Mod   Brynn, OTR/L  Acute  Rehabilitation Services Pager: [phone] Office: [phone] .   Jeri Modena 09/20/2020, 11:52 AM

## 2020-09-20 NOTE — Evaluation (Signed)
Physical Therapy Evaluation Patient Details Name: [name] [name] MRN: [id number] DOB: 1971-03-15 Today's Date: 09/20/2020   History of Present Illness  49yo female s/p repeat L5-S1 laminectomy and exploration of L5-S1 nerve roots with CSF leak. PMH urinary incontinence, chronic back pain, hx multiple lumbar laminectomies, knee arthroscopy, lumbar fusion  Clinical Impression   Patient received sidelying in bed, pleasant and willing to attempt trying therapy but very limited by pain and anxiety today. Did need as much as ModA for functional bed mobility and transfers, as well as ModA to boost up and MinA/max cues for very short distance gait training in room. L LE remains very weak- it is improving per her report- but she was able to get palpable muscle contractions. Demonstrated only mild L knee buckle with first couple of steps but as pain and fatigue increased, needed mod facilitation at quad to assist in preventing knee buckling. Left in bed positioned to comfort, RN aware of patient status. Anticipate she will do better with therapy when pain is under better control, and note even with current functional status she does have adequate support at home to safely discharge with HHPT moving forward.   Patient suffers from pain, L LE weakness, and impaired gait pattern which impairs their ability to perform daily activities like ambulate safely in the home.  A walker alone will not resolve the issues with performing activities of daily living. A wheelchair will allow patient to safely perform daily activities.  The patient can self propel in the home or has a caregiver who can provide assistance.        Follow Up Recommendations Home health PT;Supervision for mobility/OOB    Equipment Recommendations  Rolling walker with 5" wheels;Wheelchair (measurements PT);Wheelchair cushion (measurements PT);3in1 (PT)    Recommendations for Other Services       Precautions / Restrictions  Precautions Precautions: Back;Fall;Other (comment) Precaution Booklet Issued:  (discussed and practiced lumbar precautions) Precaution Comments: significant L LE weakness, no brace per MD orders Restrictions Weight Bearing Restrictions: No      Mobility  Bed Mobility Overal bed mobility: Needs Assistance Bed Mobility: Sidelying to Sit;Sit to Sidelying   Sidelying to sit: Mod assist     Sit to sidelying: Mod assist General bed mobility comments: verbalized and demonstrated good understanding of precautions, needed ModA to manage BLEs with bed mobility  Transfers Overall transfer level: Needs assistance Equipment used: Rolling walker (2 wheeled) Transfers: Sit to/from Stand Sit to Stand: Mod assist         General transfer comment: ModA to boost to upright standing position, very anxious and needed cues for relaxation. Heavy weight shift to RLE.  Ambulation/Gait Ambulation/Gait assistance: Min assist Gait Distance (Feet): 2 Feet Assistive device: Rolling walker (2 wheeled) Gait Pattern/deviations: Step-to pattern;Decreased step length - right;Decreased stance time - left;Decreased stride length;Decreased dorsiflexion - left;Decreased weight shift to left;Shuffle;Antalgic;Narrow base of support Gait velocity: decreased   General Gait Details: very slow, fearful with gait pattern; used tactile facilitation to L quad for improved muscle activation, initially only with mild buckling but as anxiety and fatigue/pain increased, did start having moderate buckling requiring Mod facilitation on quad. Max cues for sequencing.  Stairs            Wheelchair Mobility    Modified Rankin (Stroke Patients Only)       Balance Overall balance assessment: History of Falls;Needs assistance Sitting-balance support: Bilateral upper extremity supported;Feet supported Sitting balance-Leahy Scale: Fair Sitting balance - Comments: did not challenge  dynamically   Standing balance  support: Bilateral upper extremity supported;During functional activity Standing balance-Leahy Scale: Fair Standing balance comment: heavy UE reliance on RW but able to maintain balance with U UE support on RW as well                             Pertinent Vitals/Pain Pain Assessment: Faces Faces Pain Scale: Hurts worst Pain Location: back and radicular pain down legs Pain Descriptors / Indicators: Radiating;Stabbing;Sharp;Tightness Pain Intervention(s): Limited activity within patient's tolerance;Monitored during session;Repositioned;Premedicated before session;Utilized relaxation techniques    Home Living Family/patient expects to be discharged to:: Private residence Living Arrangements: Children;Non-relatives/Friends;Other (Comment) (PCA 10-6 7 days/week; friend can be around whenever needed) Available Help at Discharge: Friend(s);Personal care attendant;Available PRN/intermittently Type of Home: House Home Access: Stairs to enter Entrance Stairs-Rails: None Entrance Stairs-Number of Steps: 3 with wall to brace on on the right side Home Layout: One level Home Equipment: Walker - 4 wheels;Cane - single point;Tub bench;Hand held shower head;Bedside commode Additional Comments: aide from 6-10 7 days per week; current BSC is broken; MD is working on getting her Transport planner for community mobility    Prior Function Level of Independence: Needs assistance   Gait / Transfers Assistance Needed: uses SPC and rollator, reports she could walk 15-20 minutes before surgery but very slow  ADL's / Homemaking Assistance Needed: aide assist with lower body dressing and showers        Hand Dominance   Dominant Hand: Right    Extremity/Trunk Assessment   Upper Extremity Assessment Upper Extremity Assessment: Defer to OT evaluation    Lower Extremity Assessment Lower Extremity Assessment: RLE deficits/detail;LLE deficits/detail RLE Deficits / Details: functionally weak,  estimate about 4- to 4/5 with functional tasks RLE Sensation: decreased light touch RLE Coordination: WNL LLE Deficits / Details: moderately weak but also pain inhibited today, mild-moderate buckling with stance phase with RW. Estimate 2+ to 3-/5 functional MMT today. LLE: Unable to fully assess due to pain LLE Sensation: decreased proprioception;decreased light touch LLE Coordination: decreased fine motor    Cervical / Trunk Assessment Cervical / Trunk Assessment: Other exceptions Cervical / Trunk Exceptions: s/p lumbar surgery, hx of multiple lumbar surgeries  Communication   Communication: No difficulties  Cognition Arousal/Alertness: Awake/alert Behavior During Therapy: Anxious Overall Cognitive Status: Within Functional Limits for tasks assessed                                 General Comments: just very anxious with mobility, pain limited today      General Comments      Exercises     Assessment/Plan    PT Assessment Patient needs continued PT services  PT Problem List Decreased strength;Decreased activity tolerance;Decreased balance;Pain;Decreased mobility;Decreased coordination;Impaired sensation       PT Treatment Interventions DME instruction;Balance training;Gait training;Stair training;Functional mobility training;Patient/family education;Therapeutic activities;Wheelchair mobility training;Therapeutic exercise    PT Goals (Current goals can be found in the Care Plan section)  Acute Rehab PT Goals Patient Stated Goal: less pain PT Goal Formulation: With patient Time For Goal Achievement: 10/04/20 Potential to Achieve Goals: Fair    Frequency Min 5X/week   Barriers to discharge        Co-evaluation               AM-PAC PT "6 Clicks" Mobility  Outcome Measure Help needed turning from your back  to your side while in a flat bed without using bedrails?: A Little Help needed moving from lying on your back to sitting on the side of a flat  bed without using bedrails?: A Lot Help needed moving to and from a bed to a chair (including a wheelchair)?: A Lot Help needed standing up from a chair using your arms (e.g., wheelchair or bedside chair)?: A Lot Help needed to walk in hospital room?: A Little Help needed climbing 3-5 steps with a railing? : A Lot 6 Click Score: 14    End of Session Equipment Utilized During Treatment: Gait belt Activity Tolerance: Patient limited by pain;Other (comment) (limited by anxiety) Patient left: in bed;with call bell/phone within reach Nurse Communication: Mobility status PT Visit Diagnosis: Unsteadiness on feet (R26.81);Difficulty in walking, not elsewhere classified (R26.2);Pain;Muscle weakness (generalized) (M62.81);History of falling (Z91.81) Pain - Right/Left:  (back) Pain - part of body:  (back)    Time: 3790-2409 PT Time Calculation (min) (ACUTE ONLY): 38 min   Charges:   PT Evaluation $PT Eval Moderate Complexity: 1 Mod (co-eval with OT) PT Treatments $Gait Training: 8-22 mins        Windell Norfolk, DPT, PN1   Supplemental Physical Therapist [place]    Pager [phone] Acute Rehab Office [phone]

## 2020-09-21 DIAGNOSIS — M5417 Radiculopathy, lumbosacral region: Secondary | ICD-10-CM | POA: Diagnosis not present

## 2020-09-21 MED ORDER — DICLOFENAC SODIUM 50 MG PO TBEC: DELAYED_RELEASE_TABLET | ORAL | 0 refills | Status: DC

## 2020-09-21 NOTE — TOC Progression Note (Addendum)
Transition of Care [facility]) - Progression Note    Patient Details  Name: [name] [name] MRN: [id number] Date of Birth: March 20, 1971  Transition of Care [facility]) CM/SW [place], RN Phone Number: [phone]  09/21/2020, 10:27 AM   Clinical Narrative:    CM consulted for [facility] and DME through the [place]. [facility] and Spoken with Cyndee Brightly who is a SW helping cover the patients [place]. Delana Meyer has beed made aware that patient has order for [facility] aide and that patient states that she only has 3 hours of  [place] aide service per day through [place] and states that she will need more hours. Per [facility] the patient currently has 16 hours per day of [facility] aide services and in order for the patient to receive an increase in hours the [place] will need to complete an assessment for approval. Delana Meyer has requested that CM fax documentation (orders and notes) for [facility] PT/OT to Dr. Myers/ Valma Cava Nabe [phone]). This information has been faxed. Authorization number is [id number]. Jasmine also states that DMe will be provided by [facility] and that orders should be faxed to [facility] @ [phone]. Info has been faxed. DME to be delivered to the patient. Home health services have been discussed with the patient and patient has been offered choice. Home health initiated with [facility]. The agency is in the process of getting authorization and will notify CM when authorization is received. Bedside nurse Caryl Pina has been made aware and CM will notify nursing when [facility] is finalized.      Barriers to Discharge: No Barriers Identified  Expected Discharge Plan and Services           Expected Discharge Date: 09/21/20               DME Arranged: 3-N-1, Walker rolling DME Agency: Other - Comment ([facility] [place]) Date DME Agency Contacted: 09/21/20 Time DME Agency Contacted: [id number] Representative spoke with at [place]: Spoke with [place] faxed to [place]: PT, OT [facility] Agency: [facility] Date [place]: 09/21/20 Time [place]: 95 Representative spoke with at [place]: Amy   Social Determinants of Health ([place]) Interventions    Readmission Risk Interventions No flowsheet data found.

## 2020-09-21 NOTE — Progress Notes (Signed)
Physical Therapy Treatment Patient Details Name: [name] [name] MRN: [id number] DOB: [birth year] Today's Date: 09/21/2020    History of Present Illness 49yo female s/p repeat L5-S1 laminectomy and exploration of L5-S1 nerve roots with CSF leak. PMH urinary incontinence, chronic back pain, hx multiple lumbar laminectomies, knee arthroscopy, lumbar fusion    PT Comments    The pt is making good progress with mobility and therapy goals at this time. She was able to complete significantly more mobility and increased gait distance with less assistance on this day. The pt reports reduced pain levels and improved movement in LLE compared to prior sessions. The pt continues to present with limitations in LLE strength, especially at ankle DF and hip flexion to allow for clearance with swing phase during gait. The pt was able to complete stair training, but requires significant assist and reliance on rail for UE support at this time which she does not have at home. The pt is safe to d/c home with family assistance, but will continue to benefit from skilled PT to progress functional strength, mobility, and endurance to allow for improved independence in the home.    Follow Up Recommendations  Home health PT;Supervision for mobility/OOB     Equipment Recommendations  Rolling walker with 5" wheels;Wheelchair (measurements PT);Wheelchair cushion (measurements PT);3in1 (PT)    Recommendations for Other Services       Precautions / Restrictions Precautions Precautions: Back;Fall Precaution Booklet Issued: Yes (comment) Precaution Comments: pt able to state 3/3 precautions Required Braces or Orthoses:  (no brace needed) Restrictions Weight Bearing Restrictions: No    Mobility  Bed Mobility Overal bed mobility: Needs Assistance             General bed mobility comments: pt sitting EOB upon arrival of PT  Transfers Overall transfer level: Needs assistance Equipment used: Rolling walker  (2 wheeled) Transfers: Sit to/from Stand Sit to Stand: Mod assist         General transfer comment: modA to rise from low surface, VC for hand placement and minA to steady in stance. Pt reports signifiant improvement from yesterday  Ambulation/Gait Ambulation/Gait assistance: Min guard Gait Distance (Feet): 15 Feet (+ 51ft + 15 ft) Assistive device: Rolling walker (2 wheeled) Gait Pattern/deviations: Step-to pattern;Decreased step length - right;Decreased stance time - left;Decreased stride length;Decreased dorsiflexion - left;Decreased weight shift to left;Shuffle;Antalgic;Narrow base of support Gait velocity: decreased   General Gait Details: slowed gait with decreased hip flexion ankle DF of LLE, pt with improved wt acceptance, heel-toe pattern with cues, fatigues quickly   Stairs Stairs: Yes Stairs assistance: Mod assist Stair Management: One rail Left;Forwards;Step to pattern Number of Stairs: 1 General stair comments: modA to power up and control lower  from single stair.        Balance Overall balance assessment: History of Falls;Needs assistance Sitting-balance support: Feet supported;No upper extremity supported Sitting balance-Leahy Scale: Fair Sitting balance - Comments: able to sit statically with no LOB; did not challenge dynamically   Standing balance support: No upper extremity supported;During functional activity Standing balance-Leahy Scale: Fair Standing balance comment: able to stand statically without UE support, BUE support with gait                            Cognition Arousal/Alertness: Awake/alert Behavior During Therapy: WFL for tasks assessed/performed Overall Cognitive Status: Within Functional Limits for tasks assessed  General Comments: pt still voicing some anxiety with pain, but generally functional with mobility/safety awareness      Exercises General Exercises - Lower  Extremity Toe Raises: AROM;Both;20 reps;Seated Heel Raises: AROM;Both;20 reps;Seated    General Comments        Pertinent Vitals/Pain Pain Assessment: Faces Faces Pain Scale: Hurts little more Pain Location: back with transitional movement Pain Descriptors / Indicators: Grimacing;Discomfort;Operative site guarding;Sharp Pain Intervention(s): Limited activity within patient's tolerance;Monitored during session;Repositioned           PT Goals (current goals can now be found in the care plan section) Acute Rehab PT Goals Patient Stated Goal: to have less pain and L leg to work PT Goal Formulation: With patient Time For Goal Achievement: 10/04/20 Potential to Achieve Goals: Fair Progress towards PT goals: Progressing toward goals    Frequency    Min 5X/week      PT Plan Current plan remains appropriate       AM-PAC PT "6 Clicks" Mobility   Outcome Measure  Help needed turning from your back to your side while in a flat bed without using bedrails?: A Little Help needed moving from lying on your back to sitting on the side of a flat bed without using bedrails?: A Little Help needed moving to and from a bed to a chair (including a wheelchair)?: A Little Help needed standing up from a chair using your arms (e.g., wheelchair or bedside chair)?: A Little Help needed to walk in hospital room?: A Little Help needed climbing 3-5 steps with a railing? : A Lot 6 Click Score: 17    End of Session Equipment Utilized During Treatment: Gait belt Activity Tolerance: Patient tolerated treatment well;Patient limited by fatigue Patient left: in bed;with call bell/phone within reach (sitting EOB) Nurse Communication: Mobility status PT Visit Diagnosis: Unsteadiness on feet (R26.81);Difficulty in walking, not elsewhere classified (R26.2);Pain;Muscle weakness (generalized) (M62.81);History of falling (Z91.81) Pain - part of body:  (back)     Time: 0737-1062 PT Time Calculation (min)  (ACUTE ONLY): 34 min  Charges:  $Gait Training: 8-22 mins $Therapeutic Activity: 8-22 mins                     Karma Ganja, PT, DPT   Acute Rehabilitation Department Pager #: [phone]   Otho Bellows 09/21/2020, 1:47 PM

## 2020-09-21 NOTE — Progress Notes (Signed)
Pt and family given D/C instructions with verbal understanding. Pt's incision is clean and dry with no sign of infection. Pt's IV was removed prior to D/C. Pt's Home Health was arranged by CM. Pt will received DME from the [place]. Pt D/C'd home via wheelchair per MD order. Pt is stable @ D/C and has no other needs at this time. Holli Humbles, RN

## 2020-09-21 NOTE — Discharge Instructions (Signed)
Discharge Instructions  No restriction in activities, slowly increase your activity back to normal.   Your incision is closed with dermabond (purple glue). This will naturally fall off over the next 1-2 weeks.   Okay to shower on the day of discharge. Use regular soap and water and try to be gentle when cleaning your incision.   Follow up with Dr. Zada Finders in 2-3 weeks after discharge. If you do not already have a discharge appointment, please call his office at [phone] to schedule a follow up appointment. If you have any concerns or questions, please call the office and let us know.

## 2020-09-21 NOTE — Discharge Summary (Signed)
Physician Discharge Summary  Patient ID: [name] [name] MRN: [id number] DOB/AGE: 04-11-1971 49 y.o.  Admit date: 09/19/2020 Discharge date: 09/21/2020  Admission Diagnoses: Lumbar radiculopathy  Discharge Diagnoses:  Active Problems:   Lumbar radiculopathy   Discharged Condition: good  Hospital Course: the patient was admitted on 09/19/2020 and taken to the operating room for L5-S1 decompression/exploration with CSF leak .The patient tolerated the procedure well. The patient taken to the recovery room and then to the floor in stable condition. The hospital course was routine. She is scheduled to follow up with Dr. Zada Finders in office 2-3 weeks. We are discharging to home with instructions regarding wound care and activities. The wound remains clean, dry and intact. The patient remained afebrile with stable vital signs, and tolerated a regular diet. The pain is well controlled with oral pain medications. [facility] Home health will come out to help her.  Consults: None  Discharge Exam: Blood pressure 99/70, pulse 77, temperature 97.7 F (36.5 C), temperature source Oral, resp. rate 16, height 5' 7" (1.702 m), weight 82.3 kg, SpO2 96 %. Alert and oriented x 4 PERRLA CN II-XII grossly intact  MAE, RLE: 5/5, LLE: 3/5 plantar flexion/dorsiflexion, unable to perform hip flexors due to pain. SILT x4. Radicular pain is significantly improved today. Incision is covered with dermanbond; Dressing is clean, dry, and intact  Disposition: Discharge disposition: 01-Home or Self Care        Allergies as of 09/21/2020      Reactions   Hydrocodone-acetaminophen Nausea Only   Pork-derived Products Nausea Only      Medication List    STOP taking these medications   meloxicam 15 MG tablet Commonly known as: MOBIC     TAKE these medications   bisacodyl 5 MG EC tablet Commonly known as: DULCOLAX Take 5-10 mg by mouth at bedtime.   diclofenac 50  MG EC tablet Commonly known as: VOLTAREN dont restart until 10/11 What changed: See the new instructions.   methocarbamol 500 MG tablet Commonly known as: ROBAXIN TAKE 1 TABLET BY MOUTH EVERY 8 HOURS AS NEEDED   morphine 15 MG tablet Commonly known as: MSIR Take one tablet po every 8 hours We are weaning this medication and will stop it   naloxone 4 MG/0.1ML Liqd nasal spray kit Commonly known as: NARCAN Use as directed for opioid respiratory depression or overdose. What changed:   how much to take  how to take this  when to take this   oxyCODONE 10 mg 12 hr tablet Commonly known as: OXYCONTIN Take 10 mg by mouth every 6 (six) hours as needed (Back pain).   pregabalin 75 MG capsule Commonly known as: LYRICA Take 1 capsule (75 mg total) by mouth 2 (two) times daily.   tiZANidine 4 MG tablet Commonly known as: ZANAFLEX Take 4 mg by mouth in the morning, at noon, and at bedtime.            Durable Medical Equipment  (From admission, onward)         Start     Ordered   09/21/20 0831  For home use only DME 3 n 1  Once        09/21/20 0831   09/21/20 0831  For home use only DME Walker rolling  Once       Question Answer Comment  Walker: With 5 Inch Wheels   Patient needs a walker to treat with the following condition Status post spinal  surgery      09/21/20 0831           Signed: Osie Cheeks 09/21/2020, 9:35 AM

## 2020-09-21 NOTE — Progress Notes (Signed)
Occupational Therapy Treatment Patient Details Name: [name] [name] MRN: [id number] DOB: 13-May-1971 Today's Date: 09/21/2020    History of present illness 49yo female s/p repeat L5-S1 laminectomy and exploration of L5-S1 nerve roots with CSF leak. PMH urinary incontinence, chronic back pain, hx multiple lumbar laminectomies, knee arthroscopy, lumbar fusion   OT comments  Pt making good progress towards OT goals this session. Pt continues to present with pain, LLE>RLE weakness, decreased activity tolerance and impaired ability to care for self impacting pts ability to complete BADLs. Received pt from PT with pt sitting EOB. Session focus on continued education on maintaining back precautions during ADLs with pt able to complete UB ADLs standing at sink with supervision. Pt currently requires total A for LB ADLs, however pt reports aid and family will assist with LB ADLs. Demo'ed use of all LB AE with pt verbalizing understanding, issued pt handout of items and where to purchase to increase carryover. Pt able to sit<>stand from elevated EOB with MIN A and completed functional mobility from EOB<>bathroom with RW and min guard assist. Pt would continue to benefit from skilled occupational therapy while admitted and after d/c to address the below listed limitations in order to improve overall functional mobility and facilitate independence with BADL participation. DC plan remains appropriate, will follow acutely per POC.     Follow Up Recommendations  Home health OT    Equipment Recommendations  None recommended by OT    Recommendations for Other Services      Precautions / Restrictions Precautions Precautions: Back;Fall Precaution Booklet Issued: Yes (comment) Precaution Comments: pt able to state 3/3 precautions Restrictions Weight Bearing Restrictions: No       Mobility Bed Mobility               General bed mobility comments: pt found sitting EOB with  PT  Transfers Overall transfer level: Needs assistance Equipment used: Rolling walker (2 wheeled) Transfers: Sit to/from Stand Sit to Stand: Min assist         General transfer comment: light MIN A to power into standing from elevated EOB as pt reports elevated bed at home.    Balance Overall balance assessment: History of Falls;Needs assistance Sitting-balance support: Feet supported;No upper extremity supported Sitting balance-Leahy Scale: Fair Sitting balance - Comments: able to sit statically with no LOB; did not challenge dynamically   Standing balance support: No upper extremity supported;During functional activity Standing balance-Leahy Scale: Fair Standing balance comment: able to wach face at sink with no UE support and don mask with no LOB incisio                           ADL either performed or assessed with clinical judgement   ADL Overall ADL's : Needs assistance/impaired     Grooming: Wash/dry face;Standing;Supervision/safety Grooming Details (indicate cue type and reason): pt able to demo safe compensatory techniques for UB ADLs at sink with supervision for safety               Lower Body Dressing Details (indicate cue type and reason): pt reports aid and family assist with LB ADLs, did demo all LB AE and issued pt handout of items in case pt decides to purchase items later. Toilet Transfer: Chief of Staff Details (indicate cue type and reason): simulated via functional mobilty with min guard for safety and balance with RW   Toileting - Clothing Manipulation Details (indicate cue type and reason): extensive  education on compensatory methods for posterior pericare to maintain back precautions. pt able to demo lateral leans and provided visual demo of use of toileting aid with pt verbalizing understanding   Tub/Shower Transfer Details (indicate cue type and reason): pt reports TTB bench at home Functional mobility during  ADLs: Rolling walker;Min guard General ADL Comments: pt with great improvements from prior session. pt continues to present with pain, decreased activity tolerance and impaired strength in LLE impacting pts abilty to complete BADLs     Vision       Perception     Praxis      Cognition Arousal/Alertness: Awake/alert Behavior During Therapy: WFL for tasks assessed/performed Overall Cognitive Status: Within Functional Limits for tasks assessed                                          Exercises     Shoulder Instructions       General Comments incision clean and dry, issued pt handout of LB AE and where to purchase if needed    Pertinent Vitals/ Pain       Pain Assessment: Faces Pain Score: 8  Pain Location: back with transitional movement Pain Descriptors / Indicators: Radiating;Stabbing;Sharp;Tightness;Discomfort Pain Intervention(s): Limited activity within patient's tolerance;Monitored during session;Repositioned  Home Living                                          Prior Functioning/Environment              Frequency           Progress Toward Goals  OT Goals(current goals can now be found in the care plan section)  Progress towards OT goals: Progressing toward goals  Acute Rehab OT Goals Patient Stated Goal: to have less pain and L leg to work OT Goal Formulation: With patient Time For Goal Achievement: 10/04/20 Potential to Achieve Goals: Good  Plan Discharge plan remains appropriate;Frequency remains appropriate    Co-evaluation                 AM-PAC OT "6 Clicks" Daily Activity     Outcome Measure   Help from another person eating meals?: None Help from another person taking care of personal grooming?: None Help from another person toileting, which includes using toliet, bedpan, or urinal?: A Little Help from another person bathing (including washing, rinsing, drying)?: A Lot Help from another person  to put on and taking off regular upper body clothing?: A Little Help from another person to put on and taking off regular lower body clothing?: A Lot 6 Click Score: 18    End of Session Equipment Utilized During Treatment: Gait belt;Rolling walker  OT Visit Diagnosis: Unsteadiness on feet (R26.81);Muscle weakness (generalized) (M62.81);Pain Pain - Right/Left: Left Pain - part of body: Leg   Activity Tolerance Patient tolerated treatment well   Patient Left in chair;with call bell/phone within reach   Nurse Communication Mobility status        Time: 6948-5462 OT Time Calculation (min): 31 min  Charges: OT General Charges $OT Visit: 1 Visit OT Treatments $Self Care/Home Management : 23-37 mins  Lanier Clam., COTA/L Acute Rehabilitation Services [phone] Bent 09/21/2020, 9:24 AM

## 2020-09-25 ENCOUNTER — Telehealth: Payer: Self-pay

## 2020-09-25 NOTE — Telephone Encounter (Signed)
Betsy from [facility] called in to get verbal orders from patient   PT 2xs a week for 3 weeks

## 2020-09-25 NOTE — Telephone Encounter (Signed)
I called and lmom that Dr. Emelda Brothers did her surgery on 09/19/20  and that his office would need to be the ones to give the verbal auth for PT.

## 2021-07-03 ENCOUNTER — Emergency Department (HOSPITAL_COMMUNITY): Payer: No Typology Code available for payment source

## 2021-07-03 ENCOUNTER — Encounter (HOSPITAL_COMMUNITY): Payer: Self-pay | Admitting: Emergency Medicine

## 2021-07-03 ENCOUNTER — Other Ambulatory Visit: Payer: Self-pay

## 2021-07-03 ENCOUNTER — Emergency Department (HOSPITAL_COMMUNITY)
Admission: EM | Admit: 2021-07-03 | Discharge: 2021-07-03 | Disposition: A | Discharge status: Home / Self Care | Payer: No Typology Code available for payment source | Attending: Emergency Medicine | Admitting: Emergency Medicine

## 2021-07-03 DIAGNOSIS — F17210 Nicotine dependence, cigarettes, uncomplicated: Secondary | ICD-10-CM | POA: Insufficient documentation

## 2021-07-03 DIAGNOSIS — R110 Nausea: Secondary | ICD-10-CM | POA: Diagnosis not present

## 2021-07-03 DIAGNOSIS — R1010 Upper abdominal pain, unspecified: Secondary | ICD-10-CM | POA: Insufficient documentation

## 2021-07-03 DIAGNOSIS — R0789 Other chest pain: Secondary | ICD-10-CM | POA: Insufficient documentation

## 2021-07-03 DIAGNOSIS — Z20822 Contact with and (suspected) exposure to covid-19: Secondary | ICD-10-CM | POA: Insufficient documentation

## 2021-07-03 DIAGNOSIS — R1013 Epigastric pain: Secondary | ICD-10-CM | POA: Diagnosis not present

## 2021-07-03 DIAGNOSIS — R0600 Dyspnea, unspecified: Secondary | ICD-10-CM | POA: Diagnosis not present

## 2021-07-03 DIAGNOSIS — R252 Cramp and spasm: Secondary | ICD-10-CM | POA: Insufficient documentation

## 2021-07-03 DIAGNOSIS — R079 Chest pain, unspecified: Secondary | ICD-10-CM

## 2021-07-03 LAB — COMPREHENSIVE METABOLIC PANEL
ALT: 24 U/L (ref 0–44)
AST: 25 U/L (ref 15–41)
Albumin: 4.1 g/dL (ref 3.5–5.0)
Alkaline Phosphatase: 110 U/L (ref 38–126)
Anion gap: 10 (ref 5–15)
BUN: 8 mg/dL (ref 6–20)
CO2: 24 mmol/L (ref 22–32)
Calcium: 9.6 mg/dL (ref 8.9–10.3)
Chloride: 101 mmol/L (ref 98–111)
Creatinine, Ser: 0.85 mg/dL (ref 0.44–1.00)
GFR, Estimated: >60 mL/min (ref >60)
Glucose, Bld: 118 mg/dL — ABNORMAL HIGH (ref 70–99)
Potassium: 4.5 mmol/L (ref 3.5–5.1)
Sodium: 135 mmol/L (ref 135–145)
Total Bilirubin: 0.6 mg/dL (ref 0.3–1.2)
Total Protein: 7.7 g/dL (ref 6.5–8.1)

## 2021-07-03 LAB — CBC WITH DIFFERENTIAL/PLATELET
Abs Immature Granulocytes: 0.03 10*3/uL (ref 0.00–0.07)
Basophils Absolute: 0.1 10*3/uL (ref 0.0–0.1)
Basophils Relative: 1 %
Eosinophils Absolute: 0.2 10*3/uL (ref 0.0–0.5)
Eosinophils Relative: 3 %
HCT: 47.5 % — ABNORMAL HIGH (ref 36.0–46.0)
Hemoglobin: 16 g/dL — ABNORMAL HIGH (ref 12.0–15.0)
Immature Granulocytes: 1 %
Lymphocytes Relative: 18 %
Lymphs Abs: 1.2 10*3/uL (ref 0.7–4.0)
MCH: 32 pg (ref 26.0–34.0)
MCHC: 33.7 g/dL (ref 30.0–36.0)
MCV: 95 fL (ref 80.0–100.0)
Monocytes Absolute: 0.4 10*3/uL (ref 0.1–1.0)
Monocytes Relative: 6 %
Neutro Abs: 4.7 10*3/uL (ref 1.7–7.7)
Neutrophils Relative %: 71 %
Platelets: 385 10*3/uL (ref 150–400)
RBC: 5 MIL/uL (ref 3.87–5.11)
RDW: 12.7 % (ref 11.5–15.5)
WBC: 6.6 10*3/uL (ref 4.0–10.5)
nRBC: 0 % (ref 0.0–0.2)

## 2021-07-03 LAB — PROTIME-INR
INR: 0.9 (ref 0.8–1.2)
Prothrombin Time: 12.2 seconds (ref 11.4–15.2)

## 2021-07-03 LAB — LIPASE, BLOOD: Lipase: 39 U/L (ref 11–51)

## 2021-07-03 LAB — D-DIMER, QUANTITATIVE: D-Dimer, Quant: 0.7 ug/mL-FEU — ABNORMAL HIGH (ref 0.00–0.50)

## 2021-07-03 LAB — RESP PANEL BY RT-PCR (FLU A&B, COVID) ARPGX2
Influenza A by PCR: NEGATIVE
Influenza B by PCR: NEGATIVE
SARS Coronavirus 2 by RT PCR: NEGATIVE

## 2021-07-03 LAB — TROPONIN I (HIGH SENSITIVITY)
Troponin I (High Sensitivity): 3 ng/L (ref <18)
Troponin I (High Sensitivity): <2 ng/L (ref <18)

## 2021-07-03 MED ORDER — ALUM & MAG HYDROXIDE-SIMETH 200-200-20 MG/5ML PO SUSP
30.0000 mL | Freq: Once | ORAL | Status: AC
  Administered 2021-07-03: 30 mL via ORAL
  Filled 2021-07-03: qty 30

## 2021-07-03 MED ORDER — ONDANSETRON 4 MG PO TBDP: 4.0000 mg | ORAL_TABLET | Freq: Three times a day (TID) | ORAL | 0 refills | Status: DC | PRN

## 2021-07-03 MED ORDER — PANTOPRAZOLE SODIUM 20 MG PO TBEC: 20.0000 mg | DELAYED_RELEASE_TABLET | Freq: Every day | ORAL | 0 refills | Status: DC

## 2021-07-03 MED ORDER — METHOCARBAMOL 500 MG PO TABS: 500.0000 mg | ORAL_TABLET | Freq: Three times a day (TID) | ORAL | 0 refills | Status: AC | PRN

## 2021-07-03 MED ORDER — HYDROMORPHONE HCL 1 MG/ML IJ SOLN
1.0000 mg | Freq: Once | INTRAMUSCULAR | Status: AC
  Administered 2021-07-03: 1 mg via INTRAVENOUS
  Filled 2021-07-03: qty 1

## 2021-07-03 MED ORDER — LIDOCAINE VISCOUS HCL 2 % MT SOLN
15.0000 mL | Freq: Once | OROMUCOSAL | Status: AC
  Administered 2021-07-03: 15 mL via ORAL
  Filled 2021-07-03: qty 15

## 2021-07-03 MED ORDER — ONDANSETRON HCL 4 MG/2ML IJ SOLN
4.0000 mg | Freq: Once | INTRAMUSCULAR | Status: AC
  Administered 2021-07-03: 4 mg via INTRAVENOUS
  Filled 2021-07-03: qty 2

## 2021-07-03 MED ORDER — IOHEXOL 350 MG/ML SOLN
100.0000 mL | Freq: Once | INTRAVENOUS | Status: AC | PRN
  Administered 2021-07-03: 69 mL via INTRAVENOUS

## 2021-07-03 MED ORDER — SODIUM CHLORIDE 0.9 % IV BOLUS
500.0000 mL | Freq: Once | INTRAVENOUS | Status: AC
  Administered 2021-07-03: 500 mL via INTRAVENOUS

## 2021-07-03 NOTE — ED Provider Notes (Signed)
Emergency Medicine Provider Triage Evaluation Note  [name] [name] , a 50 y.o. female  was evaluated in triage.  Pt complains of sudden onset left-sided chest pain exacerbated with deep shortness of breath started this afternoon, a few hours ago.  Pain now radiates to the right lower chest and around the side.  Down into her epigastrium as well.  Patient is chronic lower extremity cramping following spinal surgery in 09/2020 for which she is following with Dr. Venetia Constable.  Patient with history of unprovoked PEs in 2009, was on anticoagulation until 2016 with Eliquis.  No longer anticoagulated.  States this pain feels just like it did when she had PEs.  Review of Systems  Positive: Shortness of breath, chest pain, palpitations, leg cramping Negative: Cough, fevers, chills  Physical Exam  BP (!) 149/107 (BP Location: Left Arm)   Pulse (!) 108   Temp 98.1 F (36.7 C) (Oral)   Resp 18   Ht 5\' 7"  (1.702 m)   Wt 81.6 kg   SpO2 100%   BMI 28.19 kg/m  Gen:   Awake, tearful, clutching her chest Resp:  Tachypneic MSK:   Moves extremities without difficulty  Other:  Tachycardic, regular rhythm.  No M/R/G.  No lower extremity edema.  Medical Decision Making  Medically screening exam initiated at 5:14 PM.  Appropriate orders placed.  [name] [name] was informed that the remainder of the evaluation will be completed by another provider, this initial triage assessment does not replace that evaluation, and the importance of remaining in the ED until their evaluation is complete.  This chart was dictated using voice recognition software, Dragon. Despite the best efforts of this provider to proofread and correct errors, errors may still occur which can change documentation meaning.    [name] 07/03/21 1715    Tegeler, Gwenyth Allegra, MD 07/03/21 1924

## 2021-07-03 NOTE — Discharge Instructions (Addendum)
You were seen in the emergency department today for chest pain.  Your CT scan did not show findings of a blood clot or any surgical process in your abdomen at this time.  Your labs are overall reassuring.  Your hemoglobin and hematocrit were mildly elevated, please have these rechecked by your primary care provider.   We are sending you home with the following medications to help with your symptoms:  - Protonix- please take 1 tablet in the morning prior to any meals to help with stomach acidity/pain.  - Zofran- please take every 8 hours as needed for nausea/vomiting.  - Robaxin is the muscle relaxer I have prescribed, this is meant to help with muscle tightness. Be aware that this medication may make you drowsy therefore the first time you take this it should be at a time you are in an environment where you can rest. Do not drive or operate heavy machinery when taking this medication. Do not drink alcohol or take other sedating medications with this medicine such as narcotics or benzodiazepines.   We have prescribed you new medication(s) today. Discuss the medications prescribed today with your pharmacist as they can have adverse effects and interactions with your other medicines including over the counter and prescribed medications. Seek medical evaluation if you start to experience new or abnormal symptoms after taking one of these medicines, seek care immediately if you start to experience difficulty breathing, feeling of your throat closing, facial swelling, or rash as these could be indications of a more serious allergic reaction  Please follow attached diet guidelines.   Given your left foot/leg pain we would like you to have an ultrasound done to rule out a blood clot. IMPORTANT PATIENT INSTRUCTIONS:  You have been scheduled for an Outpatient Vascular Study at [facility].    If tomorrow is a Saturday, Sunday or holiday, please go to the [facility] Emergency Department Registration Desk  at 11 am tomorrow morning and tell them you are there for a vascular study.   If tomorrow is a weekday (Monday-Friday), please go to [facility] [place] at 11 am and tell them you are there for a vascular study.   Please follow-up with your primary care provider within 3 days.  Return to the emergency department for any new or worsening symptoms including but not limited to new or worsening pain, inability to keep fluids down, fever, trouble breathing, coughing up blood, blood in your stool, passing out, discoloration of your foot, new numbness, new weakness, or any other concerns.

## 2021-07-03 NOTE — ED Triage Notes (Signed)
Pt here fro  cp and shob that started an hour ago, stabbing pain on center and R side chest. Pt hx of bilateral PE, states it feels the same.

## 2021-07-03 NOTE — ED Notes (Signed)
Patient transported to CT 

## 2021-07-03 NOTE — ED Provider Notes (Signed)
[facility] EMERGENCY DEPARTMENT Provider Note   CSN: [id number] Arrival date & time: 07/03/21  1616     History Chief Complaint  Patient presents with   Chest Pain   Shortness of Breath    JAYLISSA FELTY is a 50 y.o. female with a hx of prior pulmonary embolism (no longer anticoagulated), protein c deficiency, endometriosis, & chronic back pain S/p surgical intervention who presents to the ED with complaints of chest/abdominal pain that began @ 1500 today. Patient states that she was sleeping and woke up with cramping to the arch of her left foot into her leg as well as central chest/epigastrium pain that radiated to the left side of her chest and back to her right shoulder blade, sharp in nature, constant, worse with deep breathing, no alleviating factors. Associated nausea, discomfort with swallowing, and dyspnea. Hx of PE and this feels similar, not currently anticoagulated. Has had minimal dry cough as well. Denies fever, vomiting, melena, hematochezia, diarrhea, leg swelling, hemoptysis, recent surgery/trauma, recent long travel, hormone use or hx of cancer.She had her back injections earlier today but tolerated these well.     HPI     Past Medical History:  Diagnosis Date   Arthritis    Chronic back pain    stenosis and HNP   History of pulmonary embolus (PE) 03/2008   Pneumonia    "walking" in the 90's   Protein C deficiency ([place])    Urinary urgency    Weakness    numbness and tingling on left side    Patient Active Problem List   Diagnosis Date Noted   Lumbar radiculopathy 09/19/2020   Recurrent displacement of lumbar disc 08/14/2018    Class: Chronic   Status post lumbar laminectomy 08/14/2018   S/P lumbar and lumbosacral fusion by anterior technique 10/09/2016    Class: Status post   Disc disease, degenerative, lumbar or lumbosacral 08/26/2016    Class: Chronic   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   Right lower quadrant pain  06/06/2014   Herniated nucleus pulposus, lumbar 12/10/2013   S/P hysterectomy 02/09/2013   Pulmonary embolism ([place])    Protein C deficiency ([place])    Fibroids    Endometriosis     Past Surgical History:  Procedure Laterality Date   ABDOMINAL EXPOSURE N/A 08/26/2016   Procedure: ABDOMINAL EXPOSURE;  Surgeon: Rosetta Posner, MD;  Location: [facility];  Service: Vascular;  Laterality: N/A;   ABDOMINAL HYSTERECTOMY     ANTERIOR LUMBAR FUSION N/A 08/26/2016   Procedure: ANTERIOR LUMBAR FUSION L5-S1, EXCISION OF HERNIATED DISC LEFT L5-S1, ZERO P LUMBAR INTERBODY IMPLANT, LEFT ILIAC CREST BONE GRAFT ;  Surgeon: Jessy Oto, MD;  Location: [facility];  Service: Orthopedics;  Laterality: N/A;   BACK SURGERY     DIAGNOSTIC LAPAROSCOPY  2004   x3   DILATION AND EVACUATION N/A 01/28/2013   Procedure: DILATATION AND EVACUATION;  Surgeon: Princess Bruins, MD;  Location: [facility];  Service: Gynecology;  Laterality: N/A;   KNEE ARTHROSCOPY Left    x 3   LUMBAR LAMINECTOMY N/A 12/10/2013   Procedure: LEFT L5-S1 MICRODISCECTOMY USING MIS;  Surgeon: Jessy Oto, MD;  Location: [place];  Service: Orthopedics;  Laterality: N/A;   LUMBAR LAMINECTOMY N/A 12/30/2014   Procedure: LEFT L5-S1 [place] MICRODISCECTOMY;  Surgeon: Jessy Oto, MD;  Location: [facility];  Service: Orthopedics;  Laterality: N/A;   LUMBAR LAMINECTOMY/DECOMPRESSION MICRODISCECTOMY N/A 08/14/2018   Procedure: LEFT L5-S1 FACETECTOMY WITH SI  PEDICULECTOMY AND EXPLORATION OF LEFT L5-S1 DISC;  Surgeon: Jessy Oto, MD;  Location: [place];  Service: Orthopedics;  Laterality: N/A;   LUMBAR LAMINECTOMY/DECOMPRESSION MICRODISCECTOMY N/A 09/19/2020   Procedure: Lumbar five Laminectomy with exploration of Lumbar five Sacral one Nerve roots;  Surgeon: Judith Part, MD;  Location: [facility];  Service: Neurosurgery;  Laterality: N/A;   ROBOTIC ASSISTED TOTAL HYSTERECTOMY N/A 02/09/2013   Procedure: ROBOTIC ASSISTED TOTAL HYSTERECTOMY;  Surgeon: Princess Bruins, MD;   Location: [facility];  Service: Gynecology;  Laterality: N/A;   UNILATERAL SALPINGECTOMY Right 02/09/2013   Procedure: UNILATERAL SALPINGECTOMY;  Surgeon: Princess Bruins, MD;  Location: [facility];  Service: Gynecology;  Laterality: Right;   WISDOM TOOTH EXTRACTION       OB History   No obstetric history on file.     History reviewed. No pertinent family history.  Social History   Tobacco Use   Smoking status: Every Day    Packs/day: 0.50    Years: 25.00    Pack years: 12.50    Types: Cigarettes   Smokeless tobacco: Never  Vaping Use   Vaping Use: Never used  Substance Use Topics   Alcohol use: Not Currently   Drug use: No    Home Medications Prior to Admission medications   Medication Sig Start Date End Date Taking? Authorizing Provider  bisacodyl (DULCOLAX) 5 MG EC tablet Take 5-10 mg by mouth at bedtime.    [provider]  diclofenac (VOLTAREN) 50 MG EC tablet dont restart until 10/11 09/21/20   Osie Cheeks, NP  methocarbamol (ROBAXIN) 500 MG tablet TAKE 1 TABLET BY MOUTH EVERY 8 HOURS AS NEEDED Patient not taking: Reported on 09/07/2020 04/14/19   Jessy Oto, MD  morphine (MSIR) 15 MG tablet Take one tablet po every 8 hours We are weaning this medication and will stop it Patient not taking: Reported on 09/07/2020 05/26/19   Jessy Oto, MD  naloxone [facility]) nasal spray 4 mg/0.1 mL Use as directed for opioid respiratory depression or overdose. Patient taking differently: Place 0.4 mg into the nose once. Use as directed for opioid respiratory depression or overdose. 08/15/18   Jessy Oto, MD  oxyCODONE (OXYCONTIN) 10 mg 12 hr tablet Take 10 mg by mouth every 6 (six) hours as needed (Back pain).    [provider]  pregabalin (LYRICA) 75 MG capsule Take 1 capsule (75 mg total) by mouth 2 (two) times daily. 11/23/18   Jessy Oto, MD  tiZANidine (ZANAFLEX) 4 MG tablet Take 4 mg by mouth in the morning, at noon, and at bedtime.    [provider]     Allergies    Hydrocodone-acetaminophen and Pork-derived products  Review of Systems   Review of Systems  Constitutional:  Negative for chills and fever.  Respiratory:  Positive for cough (minimal dry) and shortness of breath.   Cardiovascular:  Positive for chest pain. Negative for leg swelling.  Gastrointestinal:  Positive for abdominal pain and nausea. Negative for anal bleeding, blood in stool, constipation, diarrhea and vomiting.  Genitourinary:  Negative for dysuria.  Musculoskeletal:  Positive for arthralgias.  Neurological:  Negative for syncope.  All other systems reviewed and are negative.  Physical Exam Updated Vital Signs BP (!) 142/84 (BP Location: Right Arm)   Pulse 67   Temp 98.2 F (36.8 C)   Resp 18   Ht 5\' 7"  (1.702 m)   Wt 81.6 kg   SpO2 100%   BMI 28.19 kg/m  Physical Exam Vitals and nursing note reviewed.  Constitutional:      General: She is not in acute distress.    Appearance: She is well-developed. She is not ill-appearing or toxic-appearing.  HENT:     Head: Normocephalic and atraumatic.  Eyes:     General:        Right eye: No discharge.        Left eye: No discharge.     Conjunctiva/sclera: Conjunctivae normal.  Cardiovascular:     Rate and Rhythm: Normal rate and regular rhythm.     Pulses:          Radial pulses are 2+ on the right side and 2+ on the left side.       Dorsalis pedis pulses are 2+ on the right side and 2+ on the left side.       Posterior tibial pulses are 2+ on the right side and 2+ on the left side.  Pulmonary:     Effort: Pulmonary effort is normal. No respiratory distress.     Breath sounds: Normal breath sounds. No wheezing, rhonchi or rales.  Chest:     Chest wall: Tenderness (anterior chest wall) present.  Abdominal:     General: There is no distension.     Palpations: Abdomen is soft.     Tenderness: There is abdominal tenderness (upper abdomen). There is no guarding or rebound.  Musculoskeletal:      Cervical back: Neck supple.     Right lower leg: No tenderness. No edema.     Left lower leg: No tenderness. No edema.     Comments: Lower extremities: No obvious deformity, appreciable swelling, edema, erythema, ecchymosis, warmth, or open wounds. Patient has intact AROM to bilateral hips, knees, ankles, and all digits. Tender to palpation to the plantar aspect of the left foot and very mildly to the left calf. Otherwise nontender. Compartments are soft.   Skin:    General: Skin is warm and dry.     Capillary Refill: Capillary refill takes less than 2 seconds.     Findings: No rash.  Neurological:     Mental Status: She is alert.     Comments: Clear speech.  Sensation grossly intact bilateral lower extremities.  5-5 strength with plantar dorsiflexion bilaterally.  Psychiatric:        Mood and Affect: Mood normal.        Behavior: Behavior normal.    ED Results / Procedures / Treatments   Labs (all labs ordered are listed, but only abnormal results are displayed) Labs Reviewed  COMPREHENSIVE METABOLIC PANEL - Abnormal; Notable for the following components:      Result Value   Glucose, Bld 118 (*)    All other components within normal limits  CBC WITH DIFFERENTIAL/PLATELET - Abnormal; Notable for the following components:   Hemoglobin 16.0 (*)    HCT 47.5 (*)    All other components within normal limits  D-DIMER, QUANTITATIVE - Abnormal; Notable for the following components:   D-Dimer, Quant 0.70 (*)    All other components within normal limits  RESP PANEL BY RT-PCR (FLU A&B, COVID) [id number]  PROTIME-INR  TROPONIN I (HIGH SENSITIVITY)  TROPONIN I (HIGH SENSITIVITY)    EKG EKG Interpretation  Date/Time:  Tuesday July 03 2021 16:54:06 EDT Ventricular Rate:  90 PR Interval:  130 QRS Duration: 88 QT Interval:  366 QTC Calculation: 447 R Axis:   90 Text Interpretation: Normal sinus rhythm Rightward axis Borderline ECG Confirmed by  Quintella Reichert [id number]) on 07/03/2021 8:47:23  PM  Radiology DG Chest 2 View  Result Date: 07/03/2021 CLINICAL DATA:  Chest pain and shortness of breath. Sudden onset of left-sided pain exacerbated with deep breath. EXAM: CHEST - 2 VIEW COMPARISON:  Radiograph 08/20/2016 FINDINGS: The cardiomediastinal contours are normal. There are streaky opacities at the left lung base. Calcified granuloma in the right lower lobe. Pulmonary vasculature is normal. No confluent consolidation, pleural effusion, or pneumothorax. No acute osseous abnormalities are seen. IMPRESSION: Streaky left basilar opacities, likely atelectasis. Electronically Signed   By: Keith Rake M.D.   On: 07/03/2021 17:57   CT Angio Chest PE W and/or Wo Contrast  Result Date: 07/03/2021 CLINICAL DATA:  Abdomen pain chest pain short of breath EXAM: CT ANGIOGRAPHY CHEST CT ABDOMEN AND PELVIS WITH CONTRAST TECHNIQUE: Multidetector CT imaging of the chest was performed using the standard protocol during bolus administration of intravenous contrast. Multiplanar CT image reconstructions and MIPs were obtained to evaluate the vascular anatomy. Multidetector CT imaging of the abdomen and pelvis was performed using the standard protocol during bolus administration of intravenous contrast. CONTRAST:  60mL OMNIPAQUE IOHEXOL 350 MG/ML SOLN COMPARISON:  Chest x-ray 07/03/2021, CT 02/02/2017 FINDINGS: CTA CHEST FINDINGS Cardiovascular: Satisfactory opacification of the pulmonary arteries to the segmental level. No evidence of pulmonary embolism. Normal heart size. No pericardial effusion. Nonaneurysmal aorta. Mediastinum/Nodes: No enlarged mediastinal, hilar, or axillary lymph nodes. Thyroid gland, trachea, and esophagus demonstrate no significant findings. Lungs/Pleura: Mild emphysema. No focal airspace disease, pleural effusion, or pneumothorax. Calcified granuloma at the right base. Musculoskeletal: No chest wall abnormality. No acute or significant osseous findings. Review of the MIP images confirms  the above findings. CT ABDOMEN and PELVIS FINDINGS Hepatobiliary: Subcentimeter hypodensity at the posterior dome of the liver too small to further characterize, though likely a cyst. No calcified gallstone or biliary dilatation. Possible sludge in the gallbladder. Pancreas: Unremarkable. No pancreatic ductal dilatation or surrounding inflammatory changes. Spleen: Normal in size without focal abnormality. Adrenals/Urinary Tract: Adrenal glands are unremarkable. Kidneys are normal, without renal calculi, focal lesion, or hydronephrosis. Bladder is unremarkable. Hyperdensity within the right aspect of bladder on delayed views probably represents contrast mixing with urine. Stomach/Bowel: Stomach is within normal limits. Appendix appears normal. No evidence of bowel wall thickening, distention, or inflammatory changes. Vascular/Lymphatic: No significant vascular findings are present. No enlarged abdominal or pelvic lymph nodes. Reproductive: Status post hysterectomy. No adnexal masses. Other: Negative for free air or free fluid. Musculoskeletal: Post fusion changes at L5-S1 Review of the MIP images confirms the above findings. IMPRESSION: 1. Negative for acute pulmonary embolus. Clear lung fields. Mild emphysema 2. No CT evidence for acute intra-abdominal or pelvic abnormality. Electronically Signed   By: Donavan Foil M.D.   On: 07/03/2021 22:26   CT Abdomen Pelvis W Contrast  Result Date: 07/03/2021 CLINICAL DATA:  Abdomen pain chest pain short of breath EXAM: CT ANGIOGRAPHY CHEST CT ABDOMEN AND PELVIS WITH CONTRAST TECHNIQUE: Multidetector CT imaging of the chest was performed using the standard protocol during bolus administration of intravenous contrast. Multiplanar CT image reconstructions and MIPs were obtained to evaluate the vascular anatomy. Multidetector CT imaging of the abdomen and pelvis was performed using the standard protocol during bolus administration of intravenous contrast. CONTRAST:  63mL  OMNIPAQUE IOHEXOL 350 MG/ML SOLN COMPARISON:  Chest x-ray 07/03/2021, CT 02/02/2017 FINDINGS: CTA CHEST FINDINGS Cardiovascular: Satisfactory opacification of the pulmonary arteries to the segmental level. No evidence of pulmonary embolism. Normal heart size. No pericardial  effusion. Nonaneurysmal aorta. Mediastinum/Nodes: No enlarged mediastinal, hilar, or axillary lymph nodes. Thyroid gland, trachea, and esophagus demonstrate no significant findings. Lungs/Pleura: Mild emphysema. No focal airspace disease, pleural effusion, or pneumothorax. Calcified granuloma at the right base. Musculoskeletal: No chest wall abnormality. No acute or significant osseous findings. Review of the MIP images confirms the above findings. CT ABDOMEN and PELVIS FINDINGS Hepatobiliary: Subcentimeter hypodensity at the posterior dome of the liver too small to further characterize, though likely a cyst. No calcified gallstone or biliary dilatation. Possible sludge in the gallbladder. Pancreas: Unremarkable. No pancreatic ductal dilatation or surrounding inflammatory changes. Spleen: Normal in size without focal abnormality. Adrenals/Urinary Tract: Adrenal glands are unremarkable. Kidneys are normal, without renal calculi, focal lesion, or hydronephrosis. Bladder is unremarkable. Hyperdensity within the right aspect of bladder on delayed views probably represents contrast mixing with urine. Stomach/Bowel: Stomach is within normal limits. Appendix appears normal. No evidence of bowel wall thickening, distention, or inflammatory changes. Vascular/Lymphatic: No significant vascular findings are present. No enlarged abdominal or pelvic lymph nodes. Reproductive: Status post hysterectomy. No adnexal masses. Other: Negative for free air or free fluid. Musculoskeletal: Post fusion changes at L5-S1 Review of the MIP images confirms the above findings. IMPRESSION: 1. Negative for acute pulmonary embolus. Clear lung fields. Mild emphysema 2. No CT  evidence for acute intra-abdominal or pelvic abnormality. Electronically Signed   By: Donavan Foil M.D.   On: 07/03/2021 22:26    Procedures Procedures   Medications Ordered in ED Medications  sodium chloride 0.9 % bolus 500 mL (0 mLs Intravenous Stopped 07/03/21 2247)  ondansetron (ZOFRAN) injection 4 mg (4 mg Intravenous Given 07/03/21 2130)  HYDROmorphone (DILAUDID) injection 1 mg (1 mg Intravenous Given 07/03/21 2131)  iohexol (OMNIPAQUE) 350 MG/ML injection 100 mL (69 mLs Intravenous Contrast Given 07/03/21 2207)  alum & mag hydroxide-simeth (MAALOX/MYLANTA) 200-200-20 MG/5ML suspension 30 mL (30 mLs Oral Given 07/03/21 2241)    And  lidocaine (XYLOCAINE) 2 % viscous mouth solution 15 mL (15 mLs Oral Given 07/03/21 2240)    ED Course  I have reviewed the triage vital signs and the nursing notes.  Pertinent labs & imaging results that were available during my care of the patient were reviewed by me and considered in my medical decision making (see chart for details).    MDM Rules/Calculators/A&P                           Patient presents to the emergency department with chest pain/abdominal pain. Patient nontoxic appearing, in no apparent distress, vitals without significant abnormality- BP elevated some. Chest & upper abdominal tenderness to palaption.   DDX: ACS, pulmonary embolism, dissection, pneumothorax, pneumonia, arrhythmia, severe anemia, MSK, GERD, anxiety, abdominal process.   Additional history obtained:  Additional history obtained from chart review & nursing note review.   EKG: Normal sinus rhythm Rightward axis Borderline ECG Lab Tests:  I reviewed and interpreted labs, which included:  CBC: no critical anemia, mild elevation in hgb/hct.  CMP: fairly unremarkable.  Troponin: initial WNL D-dimer: Mild elevation  Lipase added: WNL Delta troponin: WNL- flat  Imaging Studies ordered:  CXR ordered by triage team, subsequent CTA of the chest ordered and I have  ordered CT A/P I independently reviewed, formal radiology impression shows:  CXR: streaky left basilar opacities, likely atelectasis. CTA chest & CT A/P: . Negative for acute pulmonary embolus. Clear lung fields. Mild emphysema 2. No CT evidence for acute intra-abdominal or pelvic  abnormality  ED Course:  Heart pathway score 2- EKG without obvious acute ischemia, delta troponin negative, doubt ACS.  D-dimer elevated, CT angio of the chest negative for pulmonary embolism.  CT imaging also without widened mediastinum or aortic changes to suggest dissection.  CT abdomen/pelvis without acute surgical process.  No peritoneal signs on abdominal exam.  LFTs and lipase are within normal limits.  Labs appear fairly unremarkable.  Unclear definitive etiology to patient's symptoms, however she is feeling overall improved with a reassuring work-up in the emergency department.  Given the patient did have some left foot cramping into the leg with an elevated D-dimer and a history of VTE not currently anticoagulated will order outpatient venous duplex as we do not have vascular ultrasound techs in-house at this time, I have a low suspicion for this given lack of edema and description of sxs. She is NVI distally, no signs of ischemia, no signs of infection, favor muscular cramping.  Will trial zofran, PPI, and muscle relaxant with PCP follow up. I discussed results, treatment plan, need for PCP follow-up, and return precautions with the patient. Provided opportunity for questions, patient confirmed understanding and is in agreement with plan.   Findings and plan of care discussed with supervising physician Dr. Ralene Bathe who is in agreement.   Portions of this note were generated with [employer]. Dictation errors may occur despite best attempts at proofreading.   Final Clinical Impression(s) / ED Diagnoses Final diagnoses:  Chest pain, unspecified type    Rx / DC Orders ED Discharge Orders           Ordered    methocarbamol (ROBAXIN) 500 MG tablet  Every 8 hours PRN        07/03/21 2326    pantoprazole (PROTONIX) 20 MG tablet  Daily        07/03/21 2326    ondansetron (ZOFRAN ODT) 4 MG disintegrating tablet  Every 8 hours PRN        07/03/21 2326    LE VENOUS        07/03/21 2326             Antonietta Lansdowne, Glynda Jaeger, PA-C 07/03/21 2327    Quintella Reichert, MD 07/04/21 1352

## 2021-07-04 ENCOUNTER — Emergency Department (HOSPITAL_COMMUNITY): Admission: RE | Admit: 2021-07-04 | Payer: No Typology Code available for payment source | Source: Ambulatory Visit

## 2022-04-06 ENCOUNTER — Emergency Department (HOSPITAL_COMMUNITY): Payer: No Typology Code available for payment source

## 2022-04-06 ENCOUNTER — Encounter (HOSPITAL_COMMUNITY): Payer: Self-pay

## 2022-04-06 ENCOUNTER — Inpatient Hospital Stay (HOSPITAL_COMMUNITY)
Admission: EM | Admit: 2022-04-06 | Discharge: 2022-04-11 | DRG: 202 | Disposition: A | Discharge status: Home / Self Care | Payer: No Typology Code available for payment source | Attending: Internal Medicine | Admitting: Internal Medicine

## 2022-04-06 ENCOUNTER — Other Ambulatory Visit: Payer: Self-pay

## 2022-04-06 DIAGNOSIS — F17210 Nicotine dependence, cigarettes, uncomplicated: Secondary | ICD-10-CM | POA: Diagnosis present

## 2022-04-06 DIAGNOSIS — J45901 Unspecified asthma with (acute) exacerbation: Principal | ICD-10-CM | POA: Diagnosis present

## 2022-04-06 DIAGNOSIS — R112 Nausea with vomiting, unspecified: Secondary | ICD-10-CM | POA: Diagnosis present

## 2022-04-06 DIAGNOSIS — Z20822 Contact with and (suspected) exposure to covid-19: Secondary | ICD-10-CM | POA: Diagnosis present

## 2022-04-06 DIAGNOSIS — Z86711 Personal history of pulmonary embolism: Secondary | ICD-10-CM

## 2022-04-06 DIAGNOSIS — J439 Emphysema, unspecified: Secondary | ICD-10-CM | POA: Diagnosis present

## 2022-04-06 DIAGNOSIS — F17200 Nicotine dependence, unspecified, uncomplicated: Secondary | ICD-10-CM | POA: Diagnosis present

## 2022-04-06 DIAGNOSIS — G8929 Other chronic pain: Secondary | ICD-10-CM | POA: Diagnosis present

## 2022-04-06 DIAGNOSIS — M549 Dorsalgia, unspecified: Secondary | ICD-10-CM | POA: Diagnosis present

## 2022-04-06 DIAGNOSIS — T50995A Adverse effect of other drugs, medicaments and biological substances, initial encounter: Secondary | ICD-10-CM | POA: Diagnosis present

## 2022-04-06 DIAGNOSIS — J9601 Acute respiratory failure with hypoxia: Secondary | ICD-10-CM | POA: Diagnosis present

## 2022-04-06 DIAGNOSIS — I9589 Other hypotension: Secondary | ICD-10-CM | POA: Diagnosis present

## 2022-04-06 DIAGNOSIS — R062 Wheezing: Secondary | ICD-10-CM | POA: Diagnosis not present

## 2022-04-06 DIAGNOSIS — D6859 Other primary thrombophilia: Secondary | ICD-10-CM | POA: Diagnosis present

## 2022-04-06 DIAGNOSIS — J9801 Acute bronchospasm: Secondary | ICD-10-CM | POA: Diagnosis present

## 2022-04-06 DIAGNOSIS — I5033 Acute on chronic diastolic (congestive) heart failure: Secondary | ICD-10-CM | POA: Diagnosis not present

## 2022-04-06 DIAGNOSIS — E876 Hypokalemia: Secondary | ICD-10-CM | POA: Diagnosis present

## 2022-04-06 DIAGNOSIS — R946 Abnormal results of thyroid function studies: Secondary | ICD-10-CM | POA: Diagnosis present

## 2022-04-06 DIAGNOSIS — Z885 Allergy status to narcotic agent status: Secondary | ICD-10-CM

## 2022-04-06 DIAGNOSIS — M1990 Unspecified osteoarthritis, unspecified site: Secondary | ICD-10-CM | POA: Diagnosis present

## 2022-04-06 DIAGNOSIS — Z791 Long term (current) use of non-steroidal anti-inflammatories (NSAID): Secondary | ICD-10-CM

## 2022-04-06 DIAGNOSIS — Z79899 Other long term (current) drug therapy: Secondary | ICD-10-CM

## 2022-04-06 LAB — CBC WITH DIFFERENTIAL/PLATELET
Abs Immature Granulocytes: 0.05 10*3/uL (ref 0.00–0.07)
Basophils Absolute: 0 10*3/uL (ref 0.0–0.1)
Basophils Relative: 1 %
Eosinophils Absolute: 0 10*3/uL (ref 0.0–0.5)
Eosinophils Relative: 0 %
HCT: 44.3 % (ref 36.0–46.0)
Hemoglobin: 15.2 g/dL — ABNORMAL HIGH (ref 12.0–15.0)
Immature Granulocytes: 1 %
Lymphocytes Relative: 18 %
Lymphs Abs: 1.5 10*3/uL (ref 0.7–4.0)
MCH: 32.1 pg (ref 26.0–34.0)
MCHC: 34.3 g/dL (ref 30.0–36.0)
MCV: 93.5 fL (ref 80.0–100.0)
Monocytes Absolute: 1 10*3/uL (ref 0.1–1.0)
Monocytes Relative: 12 %
Neutro Abs: 5.8 10*3/uL (ref 1.7–7.7)
Neutrophils Relative %: 68 %
Platelets: 251 10*3/uL (ref 150–400)
RBC: 4.74 MIL/uL (ref 3.87–5.11)
RDW: 11.9 % (ref 11.5–15.5)
WBC: 8.4 10*3/uL (ref 4.0–10.5)
nRBC: 0 % (ref 0.0–0.2)

## 2022-04-06 MED ORDER — SODIUM CHLORIDE 0.9 % IV BOLUS
1000.0000 mL | Freq: Once | INTRAVENOUS | Status: AC
  Administered 2022-04-06: 1000 mL via INTRAVENOUS

## 2022-04-06 NOTE — ED Triage Notes (Signed)
Pt BIB [facility] from home c/o [facility] over the past 4 days. Hx of PE in 2009. Bilateral wheezing. Pt given 10 albuterol, 0.5 atrovent, 125 solumedrol, 1g mag. Pt A&ox4 ?

## 2022-04-07 ENCOUNTER — Encounter (HOSPITAL_COMMUNITY): Payer: Self-pay | Admitting: Family Medicine

## 2022-04-07 ENCOUNTER — Emergency Department (HOSPITAL_COMMUNITY): Payer: No Typology Code available for payment source

## 2022-04-07 DIAGNOSIS — R062 Wheezing: Secondary | ICD-10-CM | POA: Diagnosis present

## 2022-04-07 DIAGNOSIS — F17210 Nicotine dependence, cigarettes, uncomplicated: Secondary | ICD-10-CM | POA: Diagnosis present

## 2022-04-07 DIAGNOSIS — I509 Heart failure, unspecified: Secondary | ICD-10-CM | POA: Diagnosis not present

## 2022-04-07 DIAGNOSIS — Z885 Allergy status to narcotic agent status: Secondary | ICD-10-CM | POA: Diagnosis not present

## 2022-04-07 DIAGNOSIS — T50995A Adverse effect of other drugs, medicaments and biological substances, initial encounter: Secondary | ICD-10-CM | POA: Diagnosis present

## 2022-04-07 DIAGNOSIS — J9801 Acute bronchospasm: Secondary | ICD-10-CM | POA: Diagnosis present

## 2022-04-07 DIAGNOSIS — R946 Abnormal results of thyroid function studies: Secondary | ICD-10-CM | POA: Diagnosis present

## 2022-04-07 DIAGNOSIS — J9601 Acute respiratory failure with hypoxia: Secondary | ICD-10-CM | POA: Diagnosis present

## 2022-04-07 DIAGNOSIS — D6859 Other primary thrombophilia: Secondary | ICD-10-CM | POA: Diagnosis present

## 2022-04-07 DIAGNOSIS — Z20822 Contact with and (suspected) exposure to covid-19: Secondary | ICD-10-CM | POA: Diagnosis present

## 2022-04-07 DIAGNOSIS — F17200 Nicotine dependence, unspecified, uncomplicated: Secondary | ICD-10-CM | POA: Diagnosis not present

## 2022-04-07 DIAGNOSIS — G8929 Other chronic pain: Secondary | ICD-10-CM | POA: Diagnosis present

## 2022-04-07 DIAGNOSIS — J439 Emphysema, unspecified: Secondary | ICD-10-CM | POA: Diagnosis present

## 2022-04-07 DIAGNOSIS — R112 Nausea with vomiting, unspecified: Secondary | ICD-10-CM | POA: Diagnosis present

## 2022-04-07 DIAGNOSIS — M1990 Unspecified osteoarthritis, unspecified site: Secondary | ICD-10-CM | POA: Diagnosis present

## 2022-04-07 DIAGNOSIS — E876 Hypokalemia: Secondary | ICD-10-CM | POA: Diagnosis present

## 2022-04-07 DIAGNOSIS — Z79899 Other long term (current) drug therapy: Secondary | ICD-10-CM | POA: Diagnosis not present

## 2022-04-07 DIAGNOSIS — Z86711 Personal history of pulmonary embolism: Secondary | ICD-10-CM | POA: Diagnosis not present

## 2022-04-07 DIAGNOSIS — I5033 Acute on chronic diastolic (congestive) heart failure: Secondary | ICD-10-CM | POA: Diagnosis not present

## 2022-04-07 DIAGNOSIS — Z791 Long term (current) use of non-steroidal anti-inflammatories (NSAID): Secondary | ICD-10-CM | POA: Diagnosis not present

## 2022-04-07 DIAGNOSIS — M549 Dorsalgia, unspecified: Secondary | ICD-10-CM | POA: Diagnosis present

## 2022-04-07 DIAGNOSIS — J45901 Unspecified asthma with (acute) exacerbation: Secondary | ICD-10-CM | POA: Diagnosis present

## 2022-04-07 DIAGNOSIS — I9589 Other hypotension: Secondary | ICD-10-CM | POA: Diagnosis present

## 2022-04-07 LAB — GLUCOSE, CAPILLARY
Glucose-Capillary: 122 mg/dL — ABNORMAL HIGH (ref 70–99)
Glucose-Capillary: 155 mg/dL — ABNORMAL HIGH (ref 70–99)

## 2022-04-07 LAB — RESP PANEL BY RT-PCR (FLU A&B, COVID) ARPGX2
Influenza A by PCR: NEGATIVE
Influenza B by PCR: NEGATIVE
SARS Coronavirus 2 by RT PCR: NEGATIVE

## 2022-04-07 LAB — COMPREHENSIVE METABOLIC PANEL
ALT: 20 U/L (ref 0–44)
AST: 32 U/L (ref 15–41)
Albumin: 3.5 g/dL (ref 3.5–5.0)
Alkaline Phosphatase: 58 U/L (ref 38–126)
Anion gap: 15 (ref 5–15)
BUN: <5 mg/dL — ABNORMAL LOW (ref 6–20)
CO2: 24 mmol/L (ref 22–32)
Calcium: 8.9 mg/dL (ref 8.9–10.3)
Chloride: 93 mmol/L — ABNORMAL LOW (ref 98–111)
Creatinine, Ser: 0.89 mg/dL (ref 0.44–1.00)
GFR, Estimated: >60 mL/min (ref >60)
Glucose, Bld: 191 mg/dL — ABNORMAL HIGH (ref 70–99)
Potassium: 2.9 mmol/L — ABNORMAL LOW (ref 3.5–5.1)
Sodium: 132 mmol/L — ABNORMAL LOW (ref 135–145)
Total Bilirubin: 0.6 mg/dL (ref 0.3–1.2)
Total Protein: 7.2 g/dL (ref 6.5–8.1)

## 2022-04-07 LAB — TROPONIN I (HIGH SENSITIVITY)
Troponin I (High Sensitivity): 3 ng/L (ref <18)
Troponin I (High Sensitivity): 6 ng/L (ref <18)

## 2022-04-07 LAB — PROCALCITONIN: Procalcitonin: 0.86 ng/mL

## 2022-04-07 LAB — CBG MONITORING, ED
Glucose-Capillary: 147 mg/dL — ABNORMAL HIGH (ref 70–99)
Glucose-Capillary: 230 mg/dL — ABNORMAL HIGH (ref 70–99)

## 2022-04-07 LAB — BRAIN NATRIURETIC PEPTIDE: B Natriuretic Peptide: 48.9 pg/mL (ref 0.0–100.0)

## 2022-04-07 LAB — MAGNESIUM: Magnesium: 2 mg/dL (ref 1.7–2.4)

## 2022-04-07 MED ORDER — SODIUM CHLORIDE 0.9% FLUSH
3.0000 mL | Freq: Two times a day (BID) | INTRAVENOUS | Status: DC
  Administered 2022-04-07 – 2022-04-11 (×7): 3 mL via INTRAVENOUS

## 2022-04-07 MED ORDER — POTASSIUM CHLORIDE CRYS ER 20 MEQ PO TBCR: 20.0000 meq | EXTENDED_RELEASE_TABLET | Freq: Once | ORAL | Status: DC

## 2022-04-07 MED ORDER — ONDANSETRON HCL 4 MG PO TABS: 4.0000 mg | ORAL_TABLET | Freq: Four times a day (QID) | ORAL | Status: DC | PRN

## 2022-04-07 MED ORDER — ENOXAPARIN SODIUM 40 MG/0.4ML IJ SOSY
40.0000 mg | PREFILLED_SYRINGE | Freq: Every day | INTRAMUSCULAR | Status: DC
  Administered 2022-04-07 – 2022-04-09 (×3): 40 mg via SUBCUTANEOUS
  Filled 2022-04-07 (×5): qty 0.4

## 2022-04-07 MED ORDER — SODIUM CHLORIDE 0.9 % IV BOLUS
1000.0000 mL | Freq: Once | INTRAVENOUS | Status: AC
  Administered 2022-04-07: 1000 mL via INTRAVENOUS

## 2022-04-07 MED ORDER — AZITHROMYCIN 250 MG PO TABS
250.0000 mg | ORAL_TABLET | Freq: Every day | ORAL | Status: DC
  Filled 2022-04-07: qty 1

## 2022-04-07 MED ORDER — POTASSIUM CHLORIDE 10 MEQ/100ML IV SOLN
10.0000 meq | Freq: Once | INTRAVENOUS | Status: AC
  Administered 2022-04-07: 10 meq via INTRAVENOUS
  Filled 2022-04-07: qty 100

## 2022-04-07 MED ORDER — SODIUM CHLORIDE 0.9 % IV SOLN
500.0000 mg | Freq: Once | INTRAVENOUS | Status: AC
  Administered 2022-04-07: 500 mg via INTRAVENOUS
  Filled 2022-04-07: qty 5

## 2022-04-07 MED ORDER — ACETAMINOPHEN 650 MG RE SUPP: 650.0000 mg | Freq: Four times a day (QID) | RECTAL | Status: DC | PRN

## 2022-04-07 MED ORDER — ACETAMINOPHEN 325 MG PO TABS
650.0000 mg | ORAL_TABLET | Freq: Four times a day (QID) | ORAL | Status: DC | PRN
  Administered 2022-04-07 – 2022-04-08 (×2): 650 mg via ORAL
  Filled 2022-04-07 (×2): qty 2

## 2022-04-07 MED ORDER — SODIUM CHLORIDE 0.9 % IV SOLN
1.0000 g | Freq: Once | INTRAVENOUS | Status: AC
  Administered 2022-04-07: 1 g via INTRAVENOUS
  Filled 2022-04-07: qty 10

## 2022-04-07 MED ORDER — POTASSIUM CHLORIDE CRYS ER 20 MEQ PO TBCR
40.0000 meq | EXTENDED_RELEASE_TABLET | Freq: Once | ORAL | Status: AC
  Administered 2022-04-07: 40 meq via ORAL
  Filled 2022-04-07: qty 2

## 2022-04-07 MED ORDER — METHYLPREDNISOLONE SODIUM SUCC 125 MG IJ SOLR
60.0000 mg | Freq: Two times a day (BID) | INTRAMUSCULAR | Status: DC
  Administered 2022-04-07 – 2022-04-11 (×8): 60 mg via INTRAVENOUS
  Filled 2022-04-07 (×8): qty 2

## 2022-04-07 MED ORDER — IPRATROPIUM-ALBUTEROL 0.5-2.5 (3) MG/3ML IN SOLN
3.0000 mL | Freq: Four times a day (QID) | RESPIRATORY_TRACT | Status: DC
  Administered 2022-04-07 – 2022-04-08 (×6): 3 mL via RESPIRATORY_TRACT
  Filled 2022-04-07 (×6): qty 3

## 2022-04-07 MED ORDER — HYDROMORPHONE HCL 1 MG/ML IJ SOLN
1.0000 mg | Freq: Once | INTRAMUSCULAR | Status: AC
  Administered 2022-04-07: 1 mg via INTRAVENOUS
  Filled 2022-04-07: qty 1

## 2022-04-07 MED ORDER — BISACODYL 5 MG PO TBEC
5.0000 mg | DELAYED_RELEASE_TABLET | Freq: Every day | ORAL | Status: DC
  Administered 2022-04-07: 10 mg via ORAL
  Administered 2022-04-08 – 2022-04-10 (×3): 5 mg via ORAL
  Filled 2022-04-07 (×3): qty 1
  Filled 2022-04-07: qty 2

## 2022-04-07 MED ORDER — ALBUTEROL SULFATE (2.5 MG/3ML) 0.083% IN NEBU
2.5000 mg | INHALATION_SOLUTION | RESPIRATORY_TRACT | Status: DC | PRN
  Administered 2022-04-07 – 2022-04-09 (×3): 2.5 mg via RESPIRATORY_TRACT
  Filled 2022-04-07 (×3): qty 3

## 2022-04-07 MED ORDER — GUAIFENESIN-DM 100-10 MG/5ML PO SYRP
5.0000 mL | ORAL_SOLUTION | ORAL | Status: DC | PRN
  Administered 2022-04-07 – 2022-04-11 (×9): 5 mL via ORAL
  Filled 2022-04-07 (×9): qty 10

## 2022-04-07 MED ORDER — PREDNISONE 20 MG PO TABS: 40.0000 mg | ORAL_TABLET | Freq: Every day | ORAL | Status: DC

## 2022-04-07 MED ORDER — PREGABALIN 75 MG PO CAPS
75.0000 mg | ORAL_CAPSULE | Freq: Two times a day (BID) | ORAL | Status: DC
  Administered 2022-04-07 – 2022-04-11 (×9): 75 mg via ORAL
  Filled 2022-04-07: qty 1
  Filled 2022-04-07: qty 3
  Filled 2022-04-07 (×7): qty 1

## 2022-04-07 MED ORDER — TIZANIDINE HCL 4 MG PO TABS
4.0000 mg | ORAL_TABLET | Freq: Three times a day (TID) | ORAL | Status: DC
  Administered 2022-04-07 – 2022-04-11 (×13): 4 mg via ORAL
  Filled 2022-04-07 (×14): qty 1

## 2022-04-07 MED ORDER — IPRATROPIUM-ALBUTEROL 0.5-2.5 (3) MG/3ML IN SOLN
3.0000 mL | Freq: Three times a day (TID) | RESPIRATORY_TRACT | Status: DC
  Administered 2022-04-07: 3 mL via RESPIRATORY_TRACT
  Filled 2022-04-07: qty 3

## 2022-04-07 MED ORDER — IOHEXOL 350 MG/ML SOLN
100.0000 mL | Freq: Once | INTRAVENOUS | Status: AC | PRN
Start: 2022-04-07 — End: 2022-04-07
  Administered 2022-04-07: 100 mL via INTRAVENOUS

## 2022-04-07 MED ORDER — POTASSIUM CHLORIDE 10 MEQ/100ML IV SOLN
10.0000 meq | INTRAVENOUS | Status: AC
  Administered 2022-04-07 (×2): 10 meq via INTRAVENOUS
  Filled 2022-04-07 (×3): qty 100

## 2022-04-07 MED ORDER — SODIUM CHLORIDE 0.9 % IV SOLN
1.0000 g | INTRAVENOUS | Status: DC
  Administered 2022-04-07: 1 g via INTRAVENOUS
  Filled 2022-04-07: qty 10

## 2022-04-07 MED ORDER — FUROSEMIDE 10 MG/ML IJ SOLN
20.0000 mg | Freq: Once | INTRAMUSCULAR | Status: AC
  Administered 2022-04-07: 20 mg via INTRAVENOUS
  Filled 2022-04-07: qty 2

## 2022-04-07 MED ORDER — UMECLIDINIUM BROMIDE 62.5 MCG/ACT IN AEPB
1.0000 | INHALATION_SPRAY | Freq: Every day | RESPIRATORY_TRACT | Status: DC
  Administered 2022-04-07 – 2022-04-10 (×4): 1 via RESPIRATORY_TRACT
  Filled 2022-04-07: qty 7

## 2022-04-07 MED ORDER — ONDANSETRON HCL 4 MG/2ML IJ SOLN
4.0000 mg | Freq: Four times a day (QID) | INTRAMUSCULAR | Status: DC | PRN
Start: 2022-04-07 — End: 2022-04-11

## 2022-04-07 MED ORDER — INSULIN ASPART 100 UNIT/ML IJ SOLN
0.0000 [IU] | Freq: Three times a day (TID) | INTRAMUSCULAR | Status: DC
  Administered 2022-04-07: 2 [IU] via SUBCUTANEOUS
  Administered 2022-04-08 (×2): 1 [IU] via SUBCUTANEOUS

## 2022-04-07 MED ORDER — HYDROMORPHONE HCL 2 MG PO TABS
4.0000 mg | ORAL_TABLET | Freq: Four times a day (QID) | ORAL | Status: DC | PRN
  Administered 2022-04-07 (×2): 4 mg via ORAL
  Filled 2022-04-07 (×2): qty 2

## 2022-04-07 MED ORDER — LACTATED RINGERS IV BOLUS
500.0000 mL | Freq: Once | INTRAVENOUS | Status: AC
  Administered 2022-04-07: 500 mL via INTRAVENOUS

## 2022-04-07 MED ORDER — MELOXICAM 7.5 MG PO TABS
15.0000 mg | ORAL_TABLET | Freq: Every day | ORAL | Status: DC
Start: 2022-04-07 — End: 2022-04-07
  Filled 2022-04-07: qty 2

## 2022-04-07 MED ORDER — HYDROMORPHONE HCL 2 MG PO TABS
4.0000 mg | ORAL_TABLET | Freq: Four times a day (QID) | ORAL | Status: DC | PRN
  Administered 2022-04-07: 4 mg via ORAL
  Filled 2022-04-07: qty 2

## 2022-04-07 MED ORDER — METHYLPREDNISOLONE SODIUM SUCC 125 MG IJ SOLR
125.0000 mg | Freq: Two times a day (BID) | INTRAMUSCULAR | Status: DC
  Administered 2022-04-07: 125 mg via INTRAVENOUS
  Filled 2022-04-07: qty 2

## 2022-04-07 MED ORDER — LORATADINE 10 MG PO TABS
10.0000 mg | ORAL_TABLET | Freq: Every day | ORAL | Status: DC
  Administered 2022-04-07 – 2022-04-11 (×5): 10 mg via ORAL
  Filled 2022-04-07 (×5): qty 1

## 2022-04-07 NOTE — ED Notes (Signed)
[name] [name], [phone] would like an update when available  ?

## 2022-04-07 NOTE — Plan of Care (Signed)

## 2022-04-07 NOTE — ED Notes (Signed)
Unable to pull blood from angiocath   and the angio cath will not irrigate  and the iv site is painful ?

## 2022-04-07 NOTE — ED Notes (Signed)
The pt is coughing non-productive   wants to know how long before she goes upstairs  no bed assigned yet ?

## 2022-04-07 NOTE — ED Notes (Signed)
BP has been lower with MAP >65 MD Candiss Norse made aware.  ?

## 2022-04-07 NOTE — Progress Notes (Signed)
?                                  PROGRESS NOTE                                             ?                                                                                                                     ?                                         ? ? Patient Demographics:  ? ? [name] [name], is a 51 y.o. female, DOB - Oct 24, 1971, [id number] ? ?Outpatient Primary MD for the patient is [name], FNP    LOS - 0  Admit date - 04/06/2022   ? ?Chief Complaint  ?Patient presents with  ? Respiratory Distress  ?    ? ?Brief Narrative (HPI from H&P)   51 y.o. female with medical history significant for chronic back pain, history of PE no longer anticoagulated, and tobacco abuse, now presenting to the emergency department for evaluation of dry cough, wheezing and shortness of breath x 5 days, was found to be hypoxic around 87% on room air with increased work of breathing, was diagnosed with acute hypoxic respiratory failure due to asthma and admitted to the hospital. ? ? Subjective:  ? ? [name] today has, No headache, No chest pain, No abdominal pain - No Nausea, No new weakness tingling or numbness, dry cough, mild SOB and wheezing. ? ? Assessment  & Plan :  ? ? ?Acute hypoxic respiratory failure due to asthma/bronchospasm.  Significant wheezing on exam, no fever or leukocytosis.  Have a dry cough, will continue with IV steroids, azithromycin, antiallergic medication Zyrtec/Claritin, Spiriva, nebulizer treatments, oxygen and monitor.  Encouraged to sit up in chair in the daytime use I-S and flutter valve for pulmonary toiletry, advance activity and titrate down oxygen. ? ?2.  Hypokalemia.  Replaced. ? ?3.  Chronic pain.  Home medications. ? ?4.  Active ongoing smoker.  Counseled to quit. ? ?5.  Remote history of PE in 2009.  Negative CTA.  No longer on therapeutic anticoagulation. ? ?   ? ?Condition - Fair ? ?Family Communication  :  None present ? ?Code Status :   Full ? ?Consults  :  None ? ?PUD Prophylaxis :   ? ? Procedures  :    ? ?CTA - 1. No pulmonary embolus or acute aortic syndrome. 2. Multifocal ground-glass opacity in a parahilar distribution, likely pulmonary edema. Emphysema ? ?   ? ?Disposition Plan  :   ? ?Status is: Inpatient ? ?  DVT Prophylaxis  :   ? ?enoxaparin (LOVENOX) injection 40 mg Start: 04/07/22 1000 ? ?Lab Results  ?Component Value Date  ? PLT 251 04/06/2022  ? ? ?Diet :  ?Diet Order   ? ?       ?  Diet Heart Room service appropriate? Yes; Fluid consistency: Thin  Diet effective now       ?  ? ?  ?  ? ?  ?  ? ?Inpatient Medications ? ?Scheduled Meds: ? [START ON 04/08/2022] azithromycin  250 mg Oral Daily  ? bisacodyl  5-10 mg Oral QHS  ? enoxaparin (LOVENOX) injection  40 mg Subcutaneous Daily  ? furosemide  20 mg Intravenous Once  ? insulin aspart  0-6 Units Subcutaneous TID WC  ? ipratropium-albuterol  3 mL Nebulization QID  ? loratadine  10 mg Oral Daily  ? methylPREDNISolone (SOLU-MEDROL) injection  60 mg Intravenous Q12H  ? potassium chloride  20 mEq Oral Once  ? pregabalin  75 mg Oral BID  ? sodium chloride flush  3 mL Intravenous Q12H  ? tiZANidine  4 mg Oral TID  ? umeclidinium bromide  1 puff Inhalation Daily  ? ?Continuous Infusions: ? cefTRIAXone (ROCEPHIN)  IV    ? potassium chloride 10 mEq (04/07/22 6712)  ? ?PRN Meds:.acetaminophen **OR** acetaminophen, albuterol, HYDROmorphone, ondansetron **OR** ondansetron (ZOFRAN) IV ? ?Antibiotics  :   ? ?Anti-infectives (From admission, onward)  ? ? Start     Dose/Rate Route Frequency Ordered Stop  ? 04/08/22 1000  azithromycin (ZITHROMAX) tablet 250 mg       ? 250 mg Oral Daily 04/07/22 0823    ? 04/07/22 2200  cefTRIAXone (ROCEPHIN) 1 g in sodium chloride 0.9 % 100 mL IVPB       ? 1 g ?200 mL/hr over 30 Minutes Intravenous Every 24 hours 04/07/22 0527 04/11/22 2159  ? 04/07/22 0300  cefTRIAXone (ROCEPHIN) 1 g in sodium chloride 0.9 % 100 mL IVPB       ? 1 g ?200 mL/hr over 30 Minutes Intravenous   Once 04/07/22 0245 04/07/22 0349  ? 04/07/22 0300  azithromycin (ZITHROMAX) 500 mg in sodium chloride 0.9 % 250 mL IVPB       ? 500 mg ?250 mL/hr over 60 Minutes Intravenous  Once 04/07/22 0245 04/07/22 0456  ? ?  ? ? ? Time Spent in minutes  30 ? ? ?Lala Lund M.D on 04/07/2022 at 8:25 AM ? ?To page go to [email or web address]  ? ?[facility] -  Office  [phone] ? ?See all Orders from today for further details ? ? ? Objective:  ? ?Vitals:  ? 04/07/22 0715 04/07/22 0730 04/07/22 0745 04/07/22 0800  ?BP: 97/73 105/77 (!) 115/95   ?Pulse: 85 88 88   ?Resp: '19 20 18   '$ ?Temp:    97.6 ?F (36.4 ?C)  ?TempSrc:    Oral  ?SpO2: 97% 97% 98%   ?Weight:      ?Height:      ? ? ?Wt Readings from Last 3 Encounters:  ?04/06/22 70.8 kg  ?07/03/21 81.6 kg  ?09/19/20 82.3 kg  ? ? ? ?Intake/Output Summary (Last 24 hours) at 04/07/2022 0825 ?Last data filed at 04/07/2022 0813 ?Gross per 24 hour  ?Intake 2450 ml  ?Output --  ?Net 2450 ml  ? ? ? ?Physical Exam ? ?Awake Alert, No new F.N deficits, Normal affect ?.AT,PERRAL ?Supple Neck, No JVD,   ?Symmetrical Chest wall movement, Good air movement bilaterally, ++ wheezing ?RRR,No  Gallops,Rubs or new Murmurs,  ?+ve B.Sounds, Abd Soft, No tenderness,   ?No Cyanosis, Clubbing or edema   ?  ? ? Data Review:  ? ? ?CBC ?Recent Labs  ?Lab 04/06/22 ?2338  ?WBC 8.4  ?HGB 15.2*  ?HCT 44.3  ?PLT 251  ?MCV 93.5  ?MCH 32.1  ?MCHC 34.3  ?RDW 11.9  ?LYMPHSABS 1.5  ?MONOABS 1.0  ?EOSABS 0.0  ?BASOSABS 0.0  ? ? ?Electrolytes ?Recent Labs  ?Lab 04/06/22 ?2338 04/07/22 ?0459 04/07/22 ?0725  ?NA 132*  --   --   ?K 2.9*  --   --   ?CL 93*  --   --   ?CO2 24  --   --   ?GLUCOSE 191*  --   --   ?BUN <5*  --   --   ?CREATININE 0.89  --   --   ?CALCIUM 8.9  --   --   ?AST 32  --   --   ?ALT 20  --   --   ?ALKPHOS 58  --   --   ?BILITOT 0.6  --   --   ?ALBUMIN 3.5  --   --   ?MG  --   --  2.0  ?PROCALCITON  --  0.86  --   ?BNP  --  48.9  --   ? ?Radiology Reports ?CT Angio Chest PE W/Cm &/Or Wo Cm ? ?Result  Date: 04/07/2022 ?CLINICAL DATA:  Pulmonary embolism suspected. EXAM: CT ANGIOGRAPHY CHEST WITH CONTRAST TECHNIQUE: Multidetector CT imaging of the chest was performed using the standard protocol during bolus administration of intravenous contrast. Multiplanar CT image reconstructions and MIPs were obtained to evaluate the vascular anatomy. RADIATION DOSE REDUCTION: This exam was performed according to the departmental dose-optimization program which includes automated exposure control, adjustment of the mA and/or kV according to patient size and/or use of iterative reconstruction technique. CONTRAST:  166m OMNIPAQUE IOHEXOL 350 MG/ML SOLN COMPARISON:  07/03/2021 FINDINGS: Cardiovascular: Contrast injection is sufficient to demonstrate satisfactory opacification of the pulmonary arteries to the segmental level. There is no pulmonary embolus or evidence of right heart strain. The size of the main pulmonary artery is normal. Heart size is normal, with no pericardial effusion. The course and caliber of the aorta are normal. There is no atherosclerotic calcification. Opacification decreased due to pulmonary arterial phase contrast bolus timing. Mediastinum/Nodes: No mediastinal, hilar or axillary lymphadenopathy. Normal visualized thyroid. Thoracic esophageal course is normal. Lungs/Pleura: Mild emphysematous change. Multifocal ground-glass opacity in a parahilar distribution, likely pulmonary edema. No pleural effusion. Upper Abdomen: Contrast bolus timing is not optimized for evaluation of the abdominal organs. The visualized portions of the organs of the upper abdomen are normal. Musculoskeletal: No chest wall abnormality. No bony spinal canal stenosis. Review of the MIP images confirms the above findings. IMPRESSION: 1. No pulmonary embolus or acute aortic syndrome. 2. Multifocal ground-glass opacity in a parahilar distribution, likely pulmonary edema. Emphysema (ICD10-J43.9). Electronically Signed   By: KUlyses JarredM.D.   On: 04/07/2022 02:12  ? ?DG Chest Portable 1 View ? ?Result Date: 04/06/2022 ?CLINICAL DATA:  Dyspnea. EXAM: PORTABLE CHEST 1 VIEW COMPARISON:  Chest radiograph dated July 03, 2021 FINDINGS: Th

## 2022-04-07 NOTE — H&P (Signed)
?History and Physical  ? ? ?DELOIS SILVESTER CBJ:[id number] DOB: [birth year] DOA: 04/06/2022 ? ?PCP: [name], FNP  ? ?Patient coming from: Home  ? ?Chief Complaint: Cough, SOB  ? ?HPI: [name] [name] is a very pleasant 51 y.o. female with medical history significant for chronic back pain, history of PE no longer anticoagulated, and tobacco abuse, now presenting to the emergency department for evaluation of cough and shortness of breath.  Patient reports that she developed a nonproductive cough the evening of 04/02/2022, she continued to have worsening despite cough syrup, cough drops, and an over-the-counter inhaler.  By the evening of 04/05/2022, her activity was severely limited by shortness of breath.  She continued to worsen and eventually called EMS last night.  She denies any associated leg swelling, orthopnea, fever, chills, or hemoptysis.  She was treated with albuterol, Atrovent, IV Solu-Medrol, and magnesium prior to arrival in the ED ? ?ED Course: Upon arrival to the ED, patient is found to be tachypneic, tachycardic, and requiring 2 L/min of supplemental oxygen to maintain saturation in the low 90s while at rest.  EKG features sinus tachycardia with LAD.  Troponin was normal x2.  Chemistry panel notable for glucose 191 and potassium 2.9.  CTA chest is negative for PE but notable for multifocal groundglass opacities and perihilar distribution.  Patient was given 2 L of saline, potassium, Rocephin, and azithromycin. ? ?Review of Systems:  ?All other systems reviewed and apart from HPI, are negative. ? ?Past Medical History:  ?Diagnosis Date  ? Arthritis   ? Chronic back pain   ? stenosis and HNP  ? History of pulmonary embolus (PE) 03/2008  ? Pneumonia   ? "walking" in the 90's  ? 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 Urinary urgency   ? Weakness   ? numbness and tingling on left side  ? ? ?Past Surgical History:  ?Procedure Laterality Date  ? ABDOMINAL EXPOSURE N/A 08/26/2016  ? Procedure: ABDOMINAL  EXPOSURE;  Surgeon: Rosetta Posner, MD;  Location: [place];  Service: Vascular;  Laterality: N/A;  ? ABDOMINAL HYSTERECTOMY    ? ANTERIOR LUMBAR FUSION N/A 08/26/2016  ? Procedure: ANTERIOR LUMBAR FUSION L5-S1, EXCISION OF HERNIATED DISC LEFT L5-S1, ZERO P LUMBAR INTERBODY IMPLANT, LEFT ILIAC CREST BONE GRAFT ;  Surgeon: Jessy Oto, MD;  Location: [facility];  Service: Orthopedics;  Laterality: N/A;  ? BACK SURGERY    ? DIAGNOSTIC LAPAROSCOPY  2004  ? x3  ? DILATION AND EVACUATION N/A 01/28/2013  ? Procedure: DILATATION AND EVACUATION;  Surgeon: Princess Bruins, MD;  Location: [facility];  Service: Gynecology;  Laterality: N/A;  ? KNEE ARTHROSCOPY Left   ? x 3  ? LUMBAR LAMINECTOMY N/A 12/10/2013  ? Procedure: LEFT L5-S1 MICRODISCECTOMY USING MIS;  Surgeon: Jessy Oto, MD;  Location: [place];  Service: Orthopedics;  Laterality: N/A;  ? LUMBAR LAMINECTOMY N/A 12/30/2014  ? Procedure: LEFT L5-S1 Redo MICRODISCECTOMY;  Surgeon: Jessy Oto, MD;  Location: [place];  Service: Orthopedics;  Laterality: N/A;  ? LUMBAR LAMINECTOMY/DECOMPRESSION MICRODISCECTOMY N/A 08/14/2018  ? Procedure: LEFT L5-S1 FACETECTOMY WITH SI PEDICULECTOMY AND EXPLORATION OF LEFT L5-S1 DISC;  Surgeon: Jessy Oto, MD;  Location: [place];  Service: Orthopedics;  Laterality: N/A;  ? LUMBAR LAMINECTOMY/DECOMPRESSION MICRODISCECTOMY N/A 09/19/2020  ? Procedure: Lumbar five Laminectomy with exploration of Lumbar five Sacral one Nerve roots;  Surgeon: Judith Part, MD;  Location: [place];  Service: Neurosurgery;  Laterality: N/A;  ?  ROBOTIC ASSISTED TOTAL HYSTERECTOMY N/A 02/09/2013  ? Procedure: ROBOTIC ASSISTED TOTAL HYSTERECTOMY;  Surgeon: Princess Bruins, MD;  Location: [facility];  Service: Gynecology;  Laterality: N/A;  ? UNILATERAL SALPINGECTOMY Right 02/09/2013  ? Procedure: UNILATERAL SALPINGECTOMY;  Surgeon: Princess Bruins, MD;  Location: [facility];  Service: Gynecology;  Laterality: Right;  ? WISDOM TOOTH EXTRACTION    ? ? ?Social History:  ? reports  that she has been smoking cigarettes. She has a 12.50 pack-year smoking history. She has never used smokeless tobacco. She reports that she does not currently use alcohol. She reports that she does not use drugs. ? ?Allergies  ?Allergen Reactions  ? Hydrocodone-Acetaminophen Nausea Only  ? Pork-Derived Products Nausea Only  ? ? ?History reviewed. No pertinent family history. ? ? ?Prior to Admission medications   ?Medication Sig Start Date End Date Taking? Authorizing Provider  ?bisacodyl (DULCOLAX) 5 MG EC tablet Take 5-10 mg by mouth at bedtime.   Yes [provider]  ?HYDROmorphone (DILAUDID) 4 MG tablet Take 4 mg by mouth every 6 (six) hours as needed for moderate pain or severe pain.   Yes [provider]  ?meloxicam (MOBIC) 15 MG tablet Take 1 tablet by mouth daily. 03/18/22  Yes [provider]  ?pregabalin (LYRICA) 75 MG capsule Take 1 capsule (75 mg total) by mouth 2 (two) times daily. 11/23/18  Yes Jessy Oto, MD  ?tiZANidine (ZANAFLEX) 4 MG tablet Take 4 mg by mouth in the morning, at noon, and at bedtime.   Yes [provider]  ?diclofenac (VOLTAREN) 50 MG EC tablet dont restart until 10/11 ?Patient not taking: Reported on 04/07/2022 09/21/20   Osie Cheeks, NP  ?methocarbamol (ROBAXIN) 500 MG tablet Take 1 tablet (500 mg total) by mouth every 8 (eight) hours as needed for muscle spasms. ?Patient not taking: Reported on 04/07/2022 07/03/21   Petrucelli, Glynda Jaeger, PA-C  ?naloxone [facility]) nasal spray 4 mg/0.1 mL Use as directed for opioid respiratory depression or overdose. ?Patient taking differently: Place 0.4 mg into the nose once. Use as directed for opioid respiratory depression or overdose. 08/15/18   Jessy Oto, MD  ?ondansetron (ZOFRAN ODT) 4 MG disintegrating tablet Take 1 tablet (4 mg total) by mouth every 8 (eight) hours as needed for nausea or vomiting. ?Patient not taking: Reported on 04/07/2022 07/03/21   Petrucelli, Aldona Bar R, PA-C  ?oxyCODONE (OXYCONTIN)  10 mg 12 hr tablet Take 10 mg by mouth every 6 (six) hours as needed (Back pain). ?Patient not taking: Reported on 04/07/2022    [provider]  ?pantoprazole (PROTONIX) 20 MG tablet Take 1 tablet (20 mg total) by mouth daily. ?Patient not taking: Reported on 04/07/2022 07/03/21   Petrucelli, Glynda Jaeger, PA-C  ? ? ?Physical Exam: ?Vitals:  ? 04/07/22 0330 04/07/22 0345 04/07/22 0400 04/07/22 0415  ?BP: 101/68 105/71 98/69 101/74  ?Pulse: (!) 106 98 98 97  ?Resp: (!) 21 (!) 23 (!) 21 (!) 22  ?Temp:      ?TempSrc:      ?SpO2: 95% 98% 97% 97%  ?Weight:      ?Height:      ? ? ?Constitutional: No diaphoresis, no pallor   ?Eyes: PERTLA, lids and conjunctivae normal ?ENMT: Mucous membranes are moist. Posterior pharynx clear of any exudate or lesions.   ?Neck: supple, no masses  ?Respiratory: Prolonged expiratory phase with diffuse wheezes. Tachypnea.   ?Cardiovascular: S1 & S2 heard, regular rate and rhythm. No extremity edema. No significant JVD. ?Abdomen: No  distension, no tenderness, soft. Bowel sounds active.  ?Musculoskeletal: no clubbing / cyanosis. No joint deformity upper and lower extremities.   ?Skin: no significant rashes, lesions, ulcers. Warm, dry, well-perfused. ?Neurologic: CN 2-12 grossly intact. Moving all extremities. Alert and oriented.  ?Psychiatric: Very pleasant. Cooperative.  ? ? ?Labs and Imaging on Admission: I have personally reviewed following labs and imaging studies ? ?CBC: ?Recent Labs  ?Lab 04/06/22 ?2338  ?WBC 8.4  ?NEUTROABS 5.8  ?HGB 15.2*  ?HCT 44.3  ?MCV 93.5  ?PLT 251  ? ?Basic Metabolic Panel: ?Recent Labs  ?Lab 04/06/22 ?2338  ?NA 132*  ?K 2.9*  ?CL 93*  ?CO2 24  ?GLUCOSE 191*  ?BUN <5*  ?CREATININE 0.89  ?CALCIUM 8.9  ? ?GFR: ?Estimated Creatinine Clearance: 73.5 mL/min (by C-G formula based on SCr of 0.89 mg/dL). ?Liver Function Tests: ?Recent Labs  ?Lab 04/06/22 ?2338  ?AST 32  ?ALT 20  ?ALKPHOS 58  ?BILITOT 0.6  ?PROT 7.2  ?ALBUMIN 3.5  ? ?No results for input(s): LIPASE,  AMYLASE in the last 168 hours. ?No results for input(s): AMMONIA in the last 168 hours. ?Coagulation Profile: ?No results for input(s): INR, PROTIME in the last 168 hours. ?Cardiac Enzymes: ?No results for i

## 2022-04-07 NOTE — ED Provider Notes (Signed)
?[facility] ?Provider Note ? ? ?CSN: [id number] ?Arrival date & time: 04/06/22  2331 ? ?  ? ?History ? ?Chief Complaint  ?Patient presents with  ? Respiratory Distress  ? ? ?[name] [name] is a 51 y.o. female with a history of pulmonary embolism not currently anticoagulated, protein C deficiency, and tobacco use who presents to the emergency department via EMS with complaints of shortness of breath that began acutely today.  Patient states she has felt poorly for the past 5 days with a dry cough, however today she acutely became short of breath with chest pain.  Her chest pain is central and radiates to the left side, worse with deep breathing, no alleviating factors.  She had significant increase in her work of breathing tonight therefore her children called 911.  Per EMS saturating 92% on room air with wheezing and rhonchi, gave 10 mg of albuterol, 0.5 mg of Atrovent, 125 mg of Solu-Medrol, and about 1 g of magnesium, stop magnesium as patient began having nausea and vomiting with it.  Patient denies fever, chills, hemoptysis, leg pain/swelling, recent foreign travel, recent trauma, recent hospitalization/surgery, or prior history of cancer. ? ?HPI ? ?  ? ?Home Medications ?Prior to Admission medications   ?Medication Sig Start Date End Date Taking? Authorizing Provider  ?bisacodyl (DULCOLAX) 5 MG EC tablet Take 5-10 mg by mouth at bedtime.    [provider]  ?diclofenac (VOLTAREN) 50 MG EC tablet dont restart until 10/11 09/21/20   Osie Cheeks, NP  ?methocarbamol (ROBAXIN) 500 MG tablet Take 1 tablet (500 mg total) by mouth every 8 (eight) hours as needed for muscle spasms. 07/03/21   Katilin Raynes, Glynda Jaeger, PA-C  ?naloxone (NARCAN) nasal spray 4 mg/0.1 mL Use as directed for opioid respiratory depression or overdose. ?Patient taking differently: Place 0.4 mg into the nose once. Use as directed for opioid respiratory depression or overdose. 08/15/18   Jessy Oto, MD  ?ondansetron (ZOFRAN ODT) 4 MG disintegrating tablet Take 1 tablet (4 mg total) by mouth every 8 (eight) hours as needed for nausea or vomiting. 07/03/21   Moani Weipert, Aldona Bar R, PA-C  ?oxyCODONE (OXYCONTIN) 10 mg 12 hr tablet Take 10 mg by mouth every 6 (six) hours as needed (Back pain).    [provider]  ?pantoprazole (PROTONIX) 20 MG tablet Take 1 tablet (20 mg total) by mouth daily. 07/03/21   Wojciech Willetts R, PA-C  ?pregabalin (LYRICA) 75 MG capsule Take 1 capsule (75 mg total) by mouth 2 (two) times daily. 11/23/18   Jessy Oto, MD  ?tiZANidine (ZANAFLEX) 4 MG tablet Take 4 mg by mouth in the morning, at noon, and at bedtime.    [provider]  ?   ? ?Allergies    ?Hydrocodone-acetaminophen and Pork-derived products   ? ?Review of Systems   ?Review of Systems  ?Constitutional:  Negative for chills and fever.  ?Respiratory:  Positive for cough and shortness of breath.   ?Cardiovascular:  Positive for chest pain. Negative for leg swelling.  ?Gastrointestinal:  Positive for vomiting. Negative for abdominal pain.  ?Neurological:  Negative for syncope.  ?All other systems reviewed and are negative. ? ?Physical Exam ?Updated Vital Signs ?BP 103/60   Pulse (!) 110   Temp 98.6 ?F (37 ?C) (Oral)   Resp (!) 28   Ht '5\' 7"'$  (1.702 m)   Wt 70.8 kg   SpO2 (!) 89%   BMI 24.43 kg/m?  ?Physical Exam ?Vitals and  nursing note reviewed.  ?Constitutional:   ?   Appearance: She is not toxic-appearing.  ?HENT:  ?   Head: Normocephalic and atraumatic.  ?Eyes:  ?   Pupils: Pupils are equal, round, and reactive to light.  ?Cardiovascular:  ?   Rate and Rhythm: Regular rhythm. Tachycardia present.  ?Pulmonary:  ?   Comments: Patient SPO2 91 to 93% on 2 L via nasal cannula with some intermittent tachypnea.  She has some mild inspiratory wheezing, prominent expiratory wheezing throughout all lung fields. ?Abdominal:  ?   General: There is no distension.  ?   Palpations: Abdomen is soft.  ?    Tenderness: There is no abdominal tenderness.  ?Musculoskeletal:  ?   Right lower leg: No edema.  ?   Left lower leg: No edema.  ?Skin: ?   General: Skin is warm and dry.  ?Neurological:  ?   Comments: Alert.  Clear speech.  ?Psychiatric:     ?   Mood and Affect: Mood normal.  ? ? ?ED Results / Procedures / Treatments   ?Labs ?(all labs ordered are listed, but only abnormal results are displayed) ?Labs Reviewed  ?COMPREHENSIVE METABOLIC PANEL - Abnormal; Notable for the following components:  ?    Result Value  ? Sodium 132 (*)   ? Potassium 2.9 (*)   ? Chloride 93 (*)   ? Glucose, Bld 191 (*)   ? BUN <5 (*)   ? All other components within normal limits  ?CBC WITH DIFFERENTIAL/PLATELET - Abnormal; Notable for the following components:  ? Hemoglobin 15.2 (*)   ? All other components within normal limits  ?RESP PANEL BY RT-PCR (FLU A&B, COVID) [id number]  ?TROPONIN I (HIGH SENSITIVITY)  ?TROPONIN I (HIGH SENSITIVITY)  ? ? ?EKG ?EKG Interpretation ? ?Date/Time:  Saturday April 06 2022 23:40:44 EDT ?Ventricular Rate:  146 ?PR Interval:  109 ?QRS Duration: 103 ?QT Interval:  287 ?QTC Calculation: 448 ?R Axis:   264 ?Text Interpretation: Sinus tachycardia LAD, consider left anterior fascicular block Probable RVH w/ secondary repol abnormality Confirmed by Gerlene Fee [phone]) on 04/06/2022 11:49:11 PM ? ?Radiology ?CT Angio Chest PE W/Cm &/Or Wo Cm ? ?Result Date: 04/07/2022 ?CLINICAL DATA:  Pulmonary embolism suspected. EXAM: CT ANGIOGRAPHY CHEST WITH CONTRAST TECHNIQUE: Multidetector CT imaging of the chest was performed using the standard protocol during bolus administration of intravenous contrast. Multiplanar CT image reconstructions and MIPs were obtained to evaluate the vascular anatomy. RADIATION DOSE REDUCTION: This exam was performed according to the departmental dose-optimization program which includes automated exposure control, adjustment of the mA and/or kV according to patient size and/or use of iterative  reconstruction technique. CONTRAST:  140m OMNIPAQUE IOHEXOL 350 MG/ML SOLN COMPARISON:  07/03/2021 FINDINGS: Cardiovascular: Contrast injection is sufficient to demonstrate satisfactory opacification of the pulmonary arteries to the segmental level. There is no pulmonary embolus or evidence of right heart strain. The size of the main pulmonary artery is normal. Heart size is normal, with no pericardial effusion. The course and caliber of the aorta are normal. There is no atherosclerotic calcification. Opacification decreased due to pulmonary arterial phase contrast bolus timing. Mediastinum/Nodes: No mediastinal, hilar or axillary lymphadenopathy. Normal visualized thyroid. Thoracic esophageal course is normal. Lungs/Pleura: Mild emphysematous change. Multifocal ground-glass opacity in a parahilar distribution, likely pulmonary edema. No pleural effusion. Upper Abdomen: Contrast bolus timing is not optimized for evaluation of the abdominal organs. The visualized portions of the organs of the upper abdomen are normal. Musculoskeletal: No chest wall abnormality.  No bony spinal canal stenosis. Review of the MIP images confirms the above findings. IMPRESSION: 1. No pulmonary embolus or acute aortic syndrome. 2. Multifocal ground-glass opacity in a parahilar distribution, likely pulmonary edema. Emphysema (ICD10-J43.9). Electronically Signed   By: Ulyses Jarred M.D.   On: 04/07/2022 02:12  ? ?DG Chest Portable 1 View ? ?Result Date: 04/06/2022 ?CLINICAL DATA:  Dyspnea. EXAM: PORTABLE CHEST 1 VIEW COMPARISON:  Chest radiograph dated July 03, 2021 FINDINGS: The heart size and mediastinal contours are within normal limits. Both lungs are clear. The visualized skeletal structures are unremarkable. IMPRESSION: No active disease. Electronically Signed   By: Keane Police D.O.   On: 04/06/2022 23:59   ? ?Procedures ?Procedures  ? ? ?Medications Ordered in ED ?Medications  ?iohexol (OMNIPAQUE) 350 MG/ML injection 100 mL (has no  administration in time range)  ?sodium chloride 0.9 % bolus 1,000 mL (0 mLs Intravenous Stopped 04/07/22 0100)  ?sodium chloride 0.9 % bolus 1,000 mL (1,000 mLs Intravenous New Bag/Given 04/07/22 0100)  ?HYDROmorphone (DI

## 2022-04-08 ENCOUNTER — Inpatient Hospital Stay (HOSPITAL_COMMUNITY): Payer: No Typology Code available for payment source

## 2022-04-08 DIAGNOSIS — J9601 Acute respiratory failure with hypoxia: Secondary | ICD-10-CM | POA: Diagnosis not present

## 2022-04-08 LAB — HEMOGLOBIN A1C
Hgb A1c MFr Bld: 5.7 % — ABNORMAL HIGH (ref 4.8–5.6)
Mean Plasma Glucose: 116.89 mg/dL

## 2022-04-08 LAB — CBC
HCT: 39.9 % (ref 36.0–46.0)
Hemoglobin: 14 g/dL (ref 12.0–15.0)
MCH: 32.3 pg (ref 26.0–34.0)
MCHC: 35.1 g/dL (ref 30.0–36.0)
MCV: 91.9 fL (ref 80.0–100.0)
Platelets: 290 10*3/uL (ref 150–400)
RBC: 4.34 MIL/uL (ref 3.87–5.11)
RDW: 12.4 % (ref 11.5–15.5)
WBC: 18.8 10*3/uL — ABNORMAL HIGH (ref 4.0–10.5)
nRBC: 0 % (ref 0.0–0.2)

## 2022-04-08 LAB — BASIC METABOLIC PANEL
Anion gap: 7 (ref 5–15)
BUN: 7 mg/dL (ref 6–20)
CO2: 22 mmol/L (ref 22–32)
Calcium: 8.9 mg/dL (ref 8.9–10.3)
Chloride: 107 mmol/L (ref 98–111)
Creatinine, Ser: 0.66 mg/dL (ref 0.44–1.00)
GFR, Estimated: >60 mL/min (ref >60)
Glucose, Bld: 153 mg/dL — ABNORMAL HIGH (ref 70–99)
Potassium: 4.1 mmol/L (ref 3.5–5.1)
Sodium: 136 mmol/L (ref 135–145)

## 2022-04-08 LAB — GLUCOSE, CAPILLARY
Glucose-Capillary: 170 mg/dL — ABNORMAL HIGH (ref 70–99)
Glucose-Capillary: 186 mg/dL — ABNORMAL HIGH (ref 70–99)
Glucose-Capillary: 144 mg/dL — ABNORMAL HIGH (ref 70–99)

## 2022-04-08 LAB — PROCALCITONIN: Procalcitonin: 1.15 ng/mL

## 2022-04-08 LAB — MAGNESIUM: Magnesium: 2 mg/dL (ref 1.7–2.4)

## 2022-04-08 LAB — C-REACTIVE PROTEIN: CRP: 13.7 mg/dL — ABNORMAL HIGH (ref <1.0)

## 2022-04-08 LAB — TSH: TSH: 0.281 u[IU]/mL — ABNORMAL LOW (ref 0.350–4.500)

## 2022-04-08 LAB — T4, FREE: Free T4: 1.17 ng/dL — ABNORMAL HIGH (ref 0.61–1.12)

## 2022-04-08 LAB — BRAIN NATRIURETIC PEPTIDE: B Natriuretic Peptide: 138 pg/mL — ABNORMAL HIGH (ref 0.0–100.0)

## 2022-04-08 LAB — CORTISOL: Cortisol, Plasma: 14.8 ug/dL

## 2022-04-08 MED ORDER — IPRATROPIUM-ALBUTEROL 0.5-2.5 (3) MG/3ML IN SOLN
3.0000 mL | Freq: Two times a day (BID) | RESPIRATORY_TRACT | Status: DC
  Administered 2022-04-08 – 2022-04-10 (×4): 3 mL via RESPIRATORY_TRACT
  Filled 2022-04-08 (×3): qty 3

## 2022-04-08 MED ORDER — AZITHROMYCIN 500 MG PO TABS
500.0000 mg | ORAL_TABLET | Freq: Every day | ORAL | Status: AC
  Administered 2022-04-08 – 2022-04-10 (×3): 500 mg via ORAL
  Filled 2022-04-08 (×3): qty 1

## 2022-04-08 MED ORDER — LACTATED RINGERS IV SOLN
INTRAVENOUS | Status: DC
Start: 2022-04-08 — End: 2022-04-08

## 2022-04-08 MED ORDER — FUROSEMIDE 10 MG/ML IJ SOLN
20.0000 mg | Freq: Once | INTRAMUSCULAR | Status: AC
  Administered 2022-04-08: 20 mg via INTRAVENOUS
  Filled 2022-04-08: qty 2

## 2022-04-08 MED ORDER — SPIRONOLACTONE 25 MG PO TABS
25.0000 mg | ORAL_TABLET | Freq: Once | ORAL | Status: AC
  Administered 2022-04-08: 25 mg via ORAL
  Filled 2022-04-08: qty 1

## 2022-04-08 MED ORDER — HYDROMORPHONE HCL 2 MG PO TABS
2.0000 mg | ORAL_TABLET | Freq: Four times a day (QID) | ORAL | Status: DC | PRN
  Administered 2022-04-08 – 2022-04-11 (×12): 2 mg via ORAL
  Filled 2022-04-08 (×12): qty 1

## 2022-04-08 MED ORDER — FUROSEMIDE 10 MG/ML IJ SOLN
20.0000 mg | Freq: Once | INTRAMUSCULAR | Status: AC
Start: 2022-04-08 — End: 2022-04-08
  Administered 2022-04-08: 20 mg via INTRAVENOUS
  Filled 2022-04-08: qty 2

## 2022-04-08 MED ORDER — MIDODRINE HCL 5 MG PO TABS
10.0000 mg | ORAL_TABLET | Freq: Three times a day (TID) | ORAL | Status: DC
  Filled 2022-04-08: qty 2

## 2022-04-08 MED ORDER — POTASSIUM CHLORIDE CRYS ER 10 MEQ PO TBCR
10.0000 meq | EXTENDED_RELEASE_TABLET | Freq: Once | ORAL | Status: AC
  Administered 2022-04-08: 10 meq via ORAL
  Filled 2022-04-08: qty 1

## 2022-04-08 MED ORDER — MIDODRINE HCL 5 MG PO TABS
5.0000 mg | ORAL_TABLET | Freq: Three times a day (TID) | ORAL | Status: DC
  Administered 2022-04-08 – 2022-04-11 (×3): 5 mg via ORAL
  Filled 2022-04-08 (×6): qty 1

## 2022-04-08 NOTE — Progress Notes (Addendum)
?                                  PROGRESS NOTE                                             ?                                                                                                                     ?                                         ? ? Patient Demographics:  ? ? [name] [name], is a 51 y.o. female, DOB - 05-23-1971, JQB:[id number] ? ?Outpatient Primary MD for the patient is [name], FNP    LOS - 1  Admit date - 04/06/2022   ? ?Chief Complaint  ?Patient presents with  ? Respiratory Distress  ?    ? ?Brief Narrative (HPI from H&P)   51 y.o. female with medical history significant for chronic back pain, history of PE no longer anticoagulated, and tobacco abuse, now presenting to the emergency department for evaluation of dry cough, wheezing and shortness of breath x 5 days, was found to be hypoxic around 87% on room air with increased work of breathing, was diagnosed with acute hypoxic respiratory failure due to asthma and admitted to the hospital. ? ? Subjective:  ? ?Patient in bed, appears comfortable, denies any headache, no fever, no chest pain or pressure, still has some cough along with shortness of breath and mild wheezing, no abdominal pain. No new focal weakness. ? ? Assessment  & Plan :  ? ? ?Acute hypoxic respiratory failure due to asthma/bronchospasm.  Significant wheezing on exam, no fever or leukocytosis (time of admission now has steroid exposure).  Have a dry cough, will continue with IV steroids, azithromycin, antiallergic medication Zyrtec/Claritin, Spiriva, nebulizer treatments, oxygen and monitor.  Encouraged to sit up in chair in the daytime use I-S and flutter valve for pulmonary toiletry, advance activity and titrate down oxygen.  She has now developed mild orthopnea with elevated BNP, CT findings noted, question if she has some superimposed CHF as well.  Will gently diurese as blood pressure allows. ? ?2.  Hypokalemia.  Replaced. ? ?3.   Chronic pain.  Home medications. ? ?4.  Active ongoing smoker.  Counseled to quit. ? ?5.  Remote history of PE in 2009.  Negative CTA.  No longer on therapeutic anticoagulation. ? ?6.  Acute on chronic nonspecific CHF.  Get echocardiogram, gently diurese and monitor. ? ?7.  Chronic hypotension.  On high doses of narcotics which I am trying to reduce if patient allows, reduce narcotics,  TED stockings and midodrine, check TSH and random cortisol. ? ?8. Low TSH. check free t4 and HCG. ? ?   ? ?Condition - Fair ? ?Family Communication  :  None present ? ?Code Status :  Full ? ?Consults  :  None ? ?PUD Prophylaxis :   ? ? Procedures  :    ? ?CTA - 1. No pulmonary embolus or acute aortic syndrome. 2. Multifocal ground-glass opacity in a parahilar distribution, likely pulmonary edema. Emphysema ? ?   ? ?Disposition Plan  :   ? ?Status is: Inpatient ? ?DVT Prophylaxis  :   ? ?Place TED hose Start: 04/08/22 0803 ?enoxaparin (LOVENOX) injection 40 mg Start: 04/07/22 1000 ? ?Lab Results  ?Component Value Date  ? PLT 290 04/08/2022  ? ? ?Diet :  ?Diet Order   ? ?       ?  Diet Heart Room service appropriate? Yes; Fluid consistency: Thin  Diet effective now       ?  ? ?  ?  ? ?  ?  ? ?Inpatient Medications ? ?Scheduled Meds: ? azithromycin  500 mg Oral Daily  ? bisacodyl  5-10 mg Oral QHS  ? enoxaparin (LOVENOX) injection  40 mg Subcutaneous Daily  ? insulin aspart  0-6 Units Subcutaneous TID WC  ? ipratropium-albuterol  3 mL Nebulization QID  ? loratadine  10 mg Oral Daily  ? methylPREDNISolone (SOLU-MEDROL) injection  60 mg Intravenous Q12H  ? midodrine  10 mg Oral TID WC  ? pregabalin  75 mg Oral BID  ? sodium chloride flush  3 mL Intravenous Q12H  ? spironolactone  25 mg Oral Once  ? tiZANidine  4 mg Oral TID  ? umeclidinium bromide  1 puff Inhalation Daily  ? ?Continuous Infusions: ? ? ?PRN Meds:.acetaminophen **OR** acetaminophen, albuterol, guaiFENesin-dextromethorphan, HYDROmorphone, ondansetron **OR** ondansetron  (ZOFRAN) IV ? ?Antibiotics  :   ? ?Anti-infectives (From admission, onward)  ? ? Start     Dose/Rate Route Frequency Ordered Stop  ? 04/08/22 1000  azithromycin (ZITHROMAX) tablet 250 mg  Status:  Discontinued       ? 250 mg Oral Daily 04/07/22 0823 04/08/22 0823  ? 04/08/22 1000  azithromycin (ZITHROMAX) tablet 500 mg       ? 500 mg Oral Daily 04/08/22 0823 04/11/22 0959  ? 04/07/22 2200  cefTRIAXone (ROCEPHIN) 1 g in sodium chloride 0.9 % 100 mL IVPB  Status:  Discontinued       ? 1 g ?200 mL/hr over 30 Minutes Intravenous Every 24 hours 04/07/22 0527 04/08/22 0823  ? 04/07/22 0300  cefTRIAXone (ROCEPHIN) 1 g in sodium chloride 0.9 % 100 mL IVPB       ? 1 g ?200 mL/hr over 30 Minutes Intravenous  Once 04/07/22 0245 04/07/22 0349  ? 04/07/22 0300  azithromycin (ZITHROMAX) 500 mg in sodium chloride 0.9 % 250 mL IVPB       ? 500 mg ?250 mL/hr over 60 Minutes Intravenous  Once 04/07/22 0245 04/07/22 0456  ? ?  ? ? ? Time Spent in minutes  30 ? ? ?Lala Lund M.D on 04/08/2022 at 8:24 AM ? ?To page go to [email or web address]  ? ?[facility] -  Office  [phone] ? ?See all Orders from today for further details ? ? ? Objective:  ? ?Vitals:  ? 04/08/22 0459 04/08/22 0740 04/08/22 0748 04/08/22 0801  ?BP: 111/76 (!) 128/93    ?Pulse: 88 73    ?Resp: '19 20 20   '$ ?  Temp: (!) 97 ?F (36.1 ?C) 98.1 ?F (36.7 ?C)  98.7 ?F (37.1 ?C)  ?TempSrc: Oral Oral  Oral  ?SpO2: 93% 97%  95%  ?Weight:      ?Height:      ? ? ?Wt Readings from Last 3 Encounters:  ?04/07/22 77.8 kg  ?07/03/21 81.6 kg  ?09/19/20 82.3 kg  ? ? ? ?Intake/Output Summary (Last 24 hours) at 04/08/2022 0824 ?Last data filed at 04/07/2022 2300 ?Gross per 24 hour  ?Intake 200 ml  ?Output 650 ml  ?Net -450 ml  ? ? ? ?Physical Exam ? ?Awake Alert, No new F.N deficits, Normal affect ?[place].AT,PERRAL ?Supple Neck, No JVD,   ?Symmetrical Chest wall movement, Good air movement bilaterally, positive wheezing and few crackles at the bases ?RRR,No Gallops, Rubs or new Murmurs,  ?+ve  B.Sounds, Abd Soft, No tenderness,   ?No Cyanosis, Clubbing or edema  ? ?  ? ? Data Review:  ? ? ?CBC ?Recent Labs  ?Lab 04/06/22 ?2338 04/08/22 ?0421  ?WBC 8.4 18.8*  ?HGB 15.2* 14.0  ?HCT 44.3 39.9  ?PLT 251 290  ?MCV 93.5 91.9  ?MCH 32.1 32.3  ?MCHC 34.3 35.1  ?RDW 11.9 12.4  ?LYMPHSABS 1.5  --   ?MONOABS 1.0  --   ?EOSABS 0.0  --   ?BASOSABS 0.0  --   ? ? ?Electrolytes ?Recent Labs  ?Lab 04/06/22 ?2338 04/07/22 ?0459 04/07/22 ?0725 04/08/22 ?0421 04/08/22 ?0422 04/08/22 ?0600  ?NA 132*  --   --  136  --   --   ?K 2.9*  --   --  4.1  --   --   ?CL 93*  --   --  107  --   --   ?CO2 24  --   --  22  --   --   ?GLUCOSE 191*  --   --  153*  --   --   ?BUN <5*  --   --  7  --   --   ?CREATININE 0.89  --   --  0.66  --   --   ?CALCIUM 8.9  --   --  8.9  --   --   ?AST 32  --   --   --   --   --   ?ALT 20  --   --   --   --   --   ?ALKPHOS 58  --   --   --   --   --   ?BILITOT 0.6  --   --   --   --   --   ?ALBUMIN 3.5  --   --   --   --   --   ?MG  --   --  2.0 2.0  --   --   ?CRP  --   --   --   --   --  13.7*  ?PROCALCITON  --  0.86  --   --   --  1.15  ?HGBA1C  --   --   --   --  5.7*  --   ?BNP  --  48.9  --   --  138.0*  --   ? ?Radiology Reports ?CT Angio Chest PE W/Cm &/Or Wo Cm ? ?Result Date: 04/07/2022 ?CLINICAL DATA:  Pulmonary embolism suspected. EXAM: CT ANGIOGRAPHY CHEST WITH CONTRAST TECHNIQUE: Multidetector CT imaging of the chest was performed using the standard protocol during bolus administration of intravenous contrast. Multiplanar CT  image reconstructions and MIPs were obtained to evaluate the vascular anatomy. RADIATION DOSE REDUCTION: This exam was performed according to the departmental dose-optimization program which includes automated exposure control, adjustment of the mA and/or kV according to patient size and/or use of iterative reconstruction technique. CONTRAST:  120m OMNIPAQUE IOHEXOL 350 MG/ML SOLN COMPARISON:  07/03/2021 FINDINGS: Cardiovascular: Contrast injection is sufficient to  demonstrate satisfactory opacification of the pulmonary arteries to the segmental level. There is no pulmonary embolus or evidence of right heart strain. The size of the main pulmonary artery is normal. [place]a

## 2022-04-09 ENCOUNTER — Inpatient Hospital Stay (HOSPITAL_COMMUNITY): Payer: No Typology Code available for payment source

## 2022-04-09 DIAGNOSIS — I509 Heart failure, unspecified: Secondary | ICD-10-CM

## 2022-04-09 DIAGNOSIS — J9601 Acute respiratory failure with hypoxia: Secondary | ICD-10-CM

## 2022-04-09 LAB — ECHOCARDIOGRAM COMPLETE
AR max vel: 2.78 cm2
AV Peak grad: 4.8 mmHg
Ao pk vel: 1.09 m/s
Area-P 1/2: 4.21 cm2
Height: 67 in
S' Lateral: 4.4 cm
Weight: 2744.29 oz

## 2022-04-09 LAB — BASIC METABOLIC PANEL
Anion gap: 9 (ref 5–15)
BUN: 9 mg/dL (ref 6–20)
CO2: 23 mmol/L (ref 22–32)
Calcium: 8.9 mg/dL (ref 8.9–10.3)
Chloride: 104 mmol/L (ref 98–111)
Creatinine, Ser: 0.86 mg/dL (ref 0.44–1.00)
GFR, Estimated: >60 mL/min (ref >60)
Glucose, Bld: 179 mg/dL — ABNORMAL HIGH (ref 70–99)
Potassium: 3.9 mmol/L (ref 3.5–5.1)
Sodium: 136 mmol/L (ref 135–145)

## 2022-04-09 LAB — CBC
HCT: 38.9 % (ref 36.0–46.0)
Hemoglobin: 13.9 g/dL (ref 12.0–15.0)
MCH: 32.7 pg (ref 26.0–34.0)
MCHC: 35.7 g/dL (ref 30.0–36.0)
MCV: 91.5 fL (ref 80.0–100.0)
Platelets: 377 10*3/uL (ref 150–400)
RBC: 4.25 MIL/uL (ref 3.87–5.11)
RDW: 12.7 % (ref 11.5–15.5)
WBC: 19.4 10*3/uL — ABNORMAL HIGH (ref 4.0–10.5)
nRBC: 0 % (ref 0.0–0.2)

## 2022-04-09 LAB — HCG, QUANTITATIVE, PREGNANCY: hCG, Beta Chain, Quant, S: 5 m[IU]/mL — ABNORMAL HIGH (ref <5)

## 2022-04-09 LAB — GLUCOSE, CAPILLARY
Glucose-Capillary: 117 mg/dL — ABNORMAL HIGH (ref 70–99)
Glucose-Capillary: 134 mg/dL — ABNORMAL HIGH (ref 70–99)
Glucose-Capillary: 143 mg/dL — ABNORMAL HIGH (ref 70–99)
Glucose-Capillary: 148 mg/dL — ABNORMAL HIGH (ref 70–99)

## 2022-04-09 LAB — T3: T3, Total: 81 ng/dL (ref 71–180)

## 2022-04-09 LAB — PROCALCITONIN: Procalcitonin: 0.87 ng/mL

## 2022-04-09 LAB — HIV ANTIBODY (ROUTINE TESTING W REFLEX): HIV Screen 4th Generation wRfx: NONREACTIVE

## 2022-04-09 LAB — MAGNESIUM: Magnesium: 2.1 mg/dL (ref 1.7–2.4)

## 2022-04-09 LAB — C-REACTIVE PROTEIN: CRP: 4.3 mg/dL — ABNORMAL HIGH (ref <1.0)

## 2022-04-09 LAB — BRAIN NATRIURETIC PEPTIDE: B Natriuretic Peptide: 120.2 pg/mL — ABNORMAL HIGH (ref 0.0–100.0)

## 2022-04-09 MED ORDER — POTASSIUM CHLORIDE CRYS ER 20 MEQ PO TBCR
40.0000 meq | EXTENDED_RELEASE_TABLET | Freq: Once | ORAL | Status: AC
  Administered 2022-04-09: 40 meq via ORAL
  Filled 2022-04-09: qty 2

## 2022-04-09 MED ORDER — FUROSEMIDE 10 MG/ML IJ SOLN
40.0000 mg | Freq: Once | INTRAMUSCULAR | Status: AC
  Administered 2022-04-09: 40 mg via INTRAVENOUS
  Filled 2022-04-09: qty 4

## 2022-04-09 MED ORDER — LIP MEDEX EX OINT
TOPICAL_OINTMENT | CUTANEOUS | Status: DC | PRN
  Filled 2022-04-09: qty 7

## 2022-04-09 NOTE — Discharge Instructions (Addendum)
Follow with Primary MD [name], FNP in 7 days  ? ?Get CBC, CMP, TSH, free T4, T3, hCG, 2 view Chest X ray -  checked next visit within 1 week by Primary MD   ? ?Activity: As tolerated with Full fall precautions use walker/cane & assistance as needed ? ?Disposition Home   ? ?Diet: Heart Healthy with 1.5 L fluid restriction per day ? ?Special Instructions: If you have smoked or chewed Tobacco  in the last 2 yrs please stop smoking, stop any regular Alcohol  and or any Recreational drug use. ? ?On your next visit with your primary care physician please Get Medicines reviewed and adjusted. ? ?Please request your Prim.MD to go over all Hospital Tests and Procedure/Radiological results at the follow up, please get all Hospital records sent to your Prim MD by signing hospital release before you go home. ? ?If you experience worsening of your admission symptoms, develop shortness of breath, life threatening emergency, suicidal or homicidal thoughts you must seek medical attention immediately by calling 911 or calling your MD immediately  if symptoms less severe. ? ?You Must read complete instructions/literature along with all the possible adverse reactions/side effects for all the Medicines you take and that have been prescribed to you. Take any new Medicines after you have completely understood and accpet all the possible adverse reactions/side effects.  ? ?  ?

## 2022-04-09 NOTE — Progress Notes (Signed)
?                                  PROGRESS NOTE                                             ?                                                                                                                     ?                                         ? ? Patient Demographics:  ? ? [name] [name], is a 51 y.o. female, DOB - 1971-07-16, TTS:[id number] ? ?Outpatient Primary MD for the patient is [name], FNP    LOS - 2  Admit date - 04/06/2022   ? ?Chief Complaint  ?Patient presents with  ? Respiratory Distress  ?    ? ?Brief Narrative (HPI from H&P)   51 y.o. female with medical history significant for chronic back pain, history of PE no longer anticoagulated, and tobacco abuse, now presenting to the emergency department for evaluation of dry cough, wheezing and shortness of breath x 5 days, was found to be hypoxic around 87% on room air with increased work of breathing, was diagnosed with acute hypoxic respiratory failure due to asthma and admitted to the hospital. ? ? Subjective:  ? ?Patient in bed, appears comfortable, denies any headache, no fever, no chest pain or pressure, much improved cough and shortness of breath , no abdominal pain. No focal weakness. ? ? Assessment  & Plan :  ? ? ?Acute hypoxic respiratory failure due to asthma/bronchospasm.  Significant wheezing on exam, no fever or leukocytosis (time of admission now has steroid exposure).  Have a dry cough, will continue with IV steroids, azithromycin, antiallergic medication Zyrtec/Claritin, Spiriva, nebulizer treatments, oxygen and monitor.  Encouraged to sit up in chair in the daytime use I-S and flutter valve for pulmonary toiletry, advance activity and titrate down oxygen.  She has now developed mild orthopnea with elevated BNP, CT findings noted, question if she has some superimposed CHF as well.  Will gently diurese as blood pressure allows. ? ?2.  Hypokalemia.  Replaced. ? ?3.  Chronic pain.  Home  medications. ? ?4.  Active ongoing smoker.  Counseled to quit. ? ?5.  Remote history of PE in 2009.  Negative CTA.  No longer on therapeutic anticoagulation. ? ?6.  Acute on chronic nonspecific CHF.  Get echocardiogram - still pending, gently diurese and monitor. ? ?7.  Chronic hypotension.  On high doses of narcotics which I am trying to reduce if patient allows, reduce narcotics, TED stockings  and midodrine, stable random cortisol. ? ?8.  Mildly low TSH with borderline elevated free T4.  Could be sick euthyroid, request PCP to repeat TSH, free T4 and T3 in 2 to 3 weeks post discharge.  Patient also advised. ? ?   ? ?Condition - Fair ? ?Family Communication  :  None present ? ?Code Status :  Full ? ?Consults  :  None ? ?PUD Prophylaxis :   ? ? Procedures  :    ? ?CTA - 1. No pulmonary embolus or acute aortic syndrome. 2. Multifocal ground-glass opacity in a parahilar distribution, likely pulmonary edema. Emphysema ? ?   ? ?Disposition Plan  :   ? ?Status is: Inpatient ? ?DVT Prophylaxis  :   ? ?Place TED hose Start: 04/08/22 0803 ?enoxaparin (LOVENOX) injection 40 mg Start: 04/07/22 1000 ? ?Lab Results  ?Component Value Date  ? PLT 377 04/09/2022  ? ? ?Diet :  ?Diet Order   ? ?       ?  Diet Heart Room service appropriate? Yes; Fluid consistency: Thin  Diet effective now       ?  ? ?  ?  ? ?  ?  ? ?Inpatient Medications ? ?Scheduled Meds: ? azithromycin  500 mg Oral Daily  ? bisacodyl  5-10 mg Oral QHS  ? enoxaparin (LOVENOX) injection  40 mg Subcutaneous Daily  ? insulin aspart  0-6 Units Subcutaneous TID WC  ? ipratropium-albuterol  3 mL Nebulization BID  ? loratadine  10 mg Oral Daily  ? methylPREDNISolone (SOLU-MEDROL) injection  60 mg Intravenous Q12H  ? midodrine  5 mg Oral TID WC  ? pregabalin  75 mg Oral BID  ? sodium chloride flush  3 mL Intravenous Q12H  ? tiZANidine  4 mg Oral TID  ? umeclidinium bromide  1 puff Inhalation Daily  ? ?Continuous Infusions: ? ? ?PRN Meds:.acetaminophen **OR** acetaminophen,  albuterol, guaiFENesin-dextromethorphan, HYDROmorphone, ondansetron **OR** ondansetron (ZOFRAN) IV ? ?Time Spent in minutes  30 ? ? ?Lala Lund M.D on 04/09/2022 at 9:31 AM ? ?To page go to [email or web address]  ? ?[facility] -  Office  [phone] ? ?See all Orders from today for further details ? ? ? Objective:  ? ?Vitals:  ? 04/08/22 1708 04/08/22 1937 04/08/22 2029 04/09/22 0356  ?BP: 106/78 124/79  123/86  ?Pulse: 65 76  71  ?Resp:  20  17  ?Temp:  97.6 ?F (36.4 ?C)  97.9 ?F (36.6 ?C)  ?TempSrc:  Oral    ?SpO2:  97% 95% 96%  ?Weight:      ?Height:      ? ? ?Wt Readings from Last 3 Encounters:  ?04/07/22 77.8 kg  ?07/03/21 81.6 kg  ?09/19/20 82.3 kg  ? ? ? ?Intake/Output Summary (Last 24 hours) at 04/09/2022 0931 ?Last data filed at 04/09/2022 0458 ?Gross per 24 hour  ?Intake 480 ml  ?Output 1200 ml  ?Net -720 ml  ? ? ? ?Physical Exam ? ?Awake Alert, No new F.N deficits, Normal affect ?[place].AT,PERRAL ?Supple Neck, No JVD,   ?Symmetrical Chest wall movement, Good air movement bilaterally, mild wheezing & rales ?RRR,No Gallops, Rubs or new Murmurs,  ?+ve B.Sounds, Abd Soft, No tenderness,   ?No Cyanosis, Clubbing or edema  ? ? ? Data Review:  ? ? ?CBC ?Recent Labs  ?Lab 04/06/22 ?2338 04/08/22 ?0421 04/09/22 ?0244  ?WBC 8.4 18.8* 19.4*  ?HGB 15.2* 14.0 13.9  ?HCT 44.3 39.9 38.9  ?PLT 251 290 377  ?MCV 93.5  91.9 91.5  ?MCH 32.1 32.3 32.7  ?MCHC 34.3 35.1 35.7  ?RDW 11.9 12.4 12.7  ?LYMPHSABS 1.5  --   --   ?MONOABS 1.0  --   --   ?EOSABS 0.0  --   --   ?BASOSABS 0.0  --   --   ? ? ?Electrolytes ?Recent Labs  ?Lab 04/06/22 ?2338 04/07/22 ?0459 04/07/22 ?0725 04/08/22 ?0421 04/08/22 ?0422 04/08/22 ?0600 04/09/22 ?7829  ?NA 132*  --   --  136  --   --  136  ?K 2.9*  --   --  4.1  --   --  3.9  ?CL 93*  --   --  107  --   --  104  ?CO2 24  --   --  22  --   --  23  ?GLUCOSE 191*  --   --  153*  --   --  179*  ?BUN <5*  --   --  7  --   --  9  ?CREATININE 0.89  --   --  0.66  --   --  0.86  ?CALCIUM 8.9  --   --  8.9   --   --  8.9  ?AST 32  --   --   --   --   --   --   ?ALT 20  --   --   --   --   --   --   ?ALKPHOS 58  --   --   --   --   --   --   ?BILITOT 0.6  --   --   --   --   --   --   ?ALBUMIN 3.5  --   --   --   --   --   --   ?MG  --   --  2.0 2.0  --   --  2.1  ?CRP  --   --   --   --   --  13.7* 4.3*  ?PROCALCITON  --  0.86  --   --   --  1.15 0.87  ?TSH  --   --  0.281*  --   --   --   --   ?HGBA1C  --   --   --   --  5.7*  --   --   ?BNP  --  48.9  --   --  138.0*  --  120.2*  ? ?Radiology Reports ?CT Angio Chest PE W/Cm &/Or Wo Cm ? ?Result Date: 04/07/2022 ?CLINICAL DATA:  Pulmonary embolism suspected. EXAM: CT ANGIOGRAPHY CHEST WITH CONTRAST TECHNIQUE: Multidetector CT imaging of the chest was performed using the standard protocol during bolus administration of intravenous contrast. Multiplanar CT image reconstructions and MIPs were obtained to evaluate the vascular anatomy. RADIATION DOSE REDUCTION: This exam was performed according to the departmental dose-optimization program which includes automated exposure control, adjustment of the mA and/or kV according to patient size and/or use of iterative reconstruction technique. CONTRAST:  152m OMNIPAQUE IOHEXOL 350 MG/ML SOLN COMPARISON:  07/03/2021 FINDINGS: Cardiovascular: Contrast injection is sufficient to demonstrate satisfactory opacification of the pulmonary arteries to the segmental level. There is no pulmonary embolus or evidence of right heart strain. The size of the main pulmonary artery is normal. Heart size is normal, with no pericardial effusion. The course and caliber of the aorta are normal. There is no atherosclerotic calcification. Opacification decreased due to pulmonary arterial  phase contrast bolus timing. Mediastinum/Nodes: No mediastinal, hilar or axillary lymphadenopathy. Normal visualized thyroid. Thoracic esophageal course is normal. Lungs/Pleura: Mild emphysematous change. Multifocal ground-glass opacity in a parahilar distribution,  likely pulmonary edema. No pleural effusion. Upper Abdomen: Contrast bolus timing is not optimized for evaluation of the abdominal organs. The visualized portions of the organs of the upper abdomen are normal.

## 2022-04-09 NOTE — Plan of Care (Signed)

## 2022-04-10 DIAGNOSIS — J9601 Acute respiratory failure with hypoxia: Secondary | ICD-10-CM | POA: Diagnosis not present

## 2022-04-10 LAB — GLUCOSE, CAPILLARY
Glucose-Capillary: 143 mg/dL — ABNORMAL HIGH (ref 70–99)
Glucose-Capillary: 128 mg/dL — ABNORMAL HIGH (ref 70–99)
Glucose-Capillary: 163 mg/dL — ABNORMAL HIGH (ref 70–99)

## 2022-04-10 MED ORDER — POTASSIUM CHLORIDE CRYS ER 20 MEQ PO TBCR
20.0000 meq | EXTENDED_RELEASE_TABLET | Freq: Once | ORAL | Status: AC
  Administered 2022-04-10: 20 meq via ORAL
  Filled 2022-04-10: qty 1

## 2022-04-10 MED ORDER — FUROSEMIDE 40 MG PO TABS
40.0000 mg | ORAL_TABLET | Freq: Once | ORAL | Status: AC
  Administered 2022-04-10: 40 mg via ORAL
  Filled 2022-04-10: qty 1

## 2022-04-10 MED ORDER — ALBUTEROL SULFATE (2.5 MG/3ML) 0.083% IN NEBU: 2.5000 mg | INHALATION_SOLUTION | Freq: Four times a day (QID) | RESPIRATORY_TRACT | Status: DC | PRN

## 2022-04-10 NOTE — Progress Notes (Signed)
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 NCM spoke with [facility] transfer coordinator April. April provided NCM with pt's VA provider's information. Pt is active with [facility]. PCP: Dr. Domenick Bookbinder, [place] [address] [phone], [phone]). ?Whitman Hero RN,BSN,CM ?[phone] ?

## 2022-04-10 NOTE — Plan of Care (Signed)
  Problem: Activity: Goal: Ability to tolerate increased activity will improve Outcome: Progressing   

## 2022-04-10 NOTE — Progress Notes (Signed)
Mobility Specialist Progress Note  ? ? 04/10/22 1304  ?Mobility  ?Activity Ambulated independently in hallway  ?Level of Assistance Modified independent, requires aide device or extra time  ?Assistive Device Front wheel walker  ?Distance Ambulated (ft) 570 ft  ?Activity Response Tolerated well  ?$Mobility charge 1 Mobility  ? ?Pre-Mobility: 92% on RA SpO2 ?During Mobility: 117 HR, 93% on 1L SpO2 ?Post-Mobility: 90 HR, 96% on 2L SpO2 ? ?Pt received coming out of BR and agreeable. Encouraged pursed lip breathing. Needed 1LO2 towards end of walk to maintain SpO2 >/=90%. Returned to chair connected to 2LO2 in room.  ? ?Hildred Alamin ?Mobility Specialist  ?Primary: 5N M.S. Phone: [phone] ?Secondary: 6N M.S. Phone: [phone] ?  ?

## 2022-04-10 NOTE — Progress Notes (Signed)
?                                  PROGRESS NOTE                                             ?                                                                                                                     ?                                         ? ? Patient Demographics:  ? ? [name] [name], is a 51 y.o. female, DOB - 02/13/1971, OAC:[id number] ? ?Outpatient Primary MD for the patient is [name], FNP    LOS - 3  Admit date - 04/06/2022   ? ?Chief Complaint  ?Patient presents with  ? Respiratory Distress  ?    ? ?Brief Narrative (HPI from H&P)   51 y.o. female with medical history significant for chronic back pain, history of PE no longer anticoagulated, and tobacco abuse, now presenting to the emergency department for evaluation of dry cough, wheezing and shortness of breath x 5 days, was found to be hypoxic around 87% on room air with increased work of breathing, was diagnosed with acute hypoxic respiratory failure due to asthma and admitted to the hospital. ? ? Subjective:  ? ?Patient in bed, appears comfortable, denies any headache, no fever, no chest pain or pressure, no shortness of breath , no abdominal pain. No new focal weakness. ? ? Assessment  & Plan :  ? ? ?Acute hypoxic respiratory failure due to asthma/bronchospasm.  Significant wheezing on exam, no fever or leukocytosis (time of admission now has steroid exposure).  Have a dry cough, will continue with IV steroids, azithromycin, antiallergic medication Zyrtec/Claritin, Spiriva, nebulizer treatments, oxygen and monitor.  Encouraged to sit up in chair in the daytime use I-S and flutter valve for pulmonary toiletry, advance activity and titrate down oxygen.  She has now developed mild orthopnea with elevated BNP, CT findings noted, question if she has some superimposed CHF as well.  Will continue to diurese as blood pressure allows. ? ?2.  Hypokalemia.  Replaced. ? ?3.  Chronic pain.  Home medications. ? ?4.   Active ongoing smoker.  Counseled to quit. ? ?5.  Remote history of PE in 2009.  Negative CTA.  No longer on therapeutic anticoagulation. ? ?6.  Acute on chronic diastolic CHF on echocardiogram with EF 60%.  Continue diuresis with Lasix and monitor.  Outpatient cardiology follow-up.  Blood pressure too low to add beta-blocker.  Continue midodrine for now. ? ?7.  Chronic hypotension.  On high doses of narcotics which I am trying to reduce if patient allows, reduce narcotics, TED stockings and midodrine, stable random cortisol. ? ?8.  Mildly low TSH with borderline elevated free T4.  Could be sick euthyroid, request PCP to repeat TSH, free T4 and T3 in 2 to 3 weeks post discharge.  Patient also advised. ? ?   ? ?Condition - Fair ? ?Family Communication  :  None present ? ?Code Status :  Full ? ?Consults  :  None ? ?PUD Prophylaxis :   ? ? Procedures  :    ? ?CTA - 1. No pulmonary embolus or acute aortic syndrome. 2. Multifocal ground-glass opacity in a parahilar distribution, likely pulmonary edema. Emphysema ? ?   ? ?Disposition Plan  :   ? ?Status is: Inpatient ? ?DVT Prophylaxis  :   ? ?Place TED hose Start: 04/08/22 0803 ?enoxaparin (LOVENOX) injection 40 mg Start: 04/07/22 1000 ? ?Lab Results  ?Component Value Date  ? PLT 377 04/09/2022  ? ? ?Diet :  ?Diet Order   ? ?       ?  Diet Heart Room service appropriate? Yes; Fluid consistency: Thin  Diet effective now       ?  ? ?  ?  ? ?  ?  ? ?Inpatient Medications ? ?Scheduled Meds: ? bisacodyl  5-10 mg Oral QHS  ? enoxaparin (LOVENOX) injection  40 mg Subcutaneous Daily  ? furosemide  40 mg Oral Once  ? insulin aspart  0-6 Units Subcutaneous TID WC  ? ipratropium-albuterol  3 mL Nebulization BID  ? loratadine  10 mg Oral Daily  ? methylPREDNISolone (SOLU-MEDROL) injection  60 mg Intravenous Q12H  ? midodrine  5 mg Oral TID WC  ? potassium chloride  20 mEq Oral Once  ? pregabalin  75 mg Oral BID  ? sodium chloride flush  3 mL Intravenous Q12H  ? tiZANidine  4 mg Oral  TID  ? umeclidinium bromide  1 puff Inhalation Daily  ? ?Continuous Infusions: ? ? ?PRN Meds:.acetaminophen **OR** acetaminophen, albuterol, guaiFENesin-dextromethorphan, HYDROmorphone, lip balm, ondansetron **OR** ondansetron (ZOFRAN) IV ? ?Time Spent in minutes  30 ? ? ?Lala Lund M.D on 04/10/2022 at 10:15 AM ? ?To page go to [email or web address]  ? ?[facility] -  Office  [phone] ? ?See all Orders from today for further details ? ? ? Objective:  ? ?Vitals:  ? 04/09/22 2100 04/09/22 2102 04/10/22 0431 04/10/22 0854  ?BP: 105/79 105/79 126/84   ?Pulse:  85 66 70  ?Resp: '17 20 18 '$ (!) 22  ?Temp:  (!) 97.5 ?F (36.4 ?C)    ?TempSrc:  Oral    ?SpO2:  96% 92%   ?Weight:      ?Height:      ? ? ?Wt Readings from Last 3 Encounters:  ?04/07/22 77.8 kg  ?07/03/21 81.6 kg  ?09/19/20 82.3 kg  ? ? ?No intake or output data in the 24 hours ending 04/10/22 1015 ? ? ? ?Physical Exam ? ?Awake Alert, No new F.N deficits, Normal affect ?[place].AT,PERRAL ?Supple Neck, No JVD,   ?Symmetrical Chest wall movement, Good air movement bilaterally, mild wheezing & rales ?RRR,No Gallops, Rubs or new Murmurs,  ?+ve B.Sounds, Abd Soft, No tenderness,   ?No Cyanosis, Clubbing or edema  ? ? ? ? Data Review:  ? ? ?CBC ?Recent Labs  ?Lab 04/06/22 ?2338 04/08/22 ?0421 04/09/22 ?0244  ?WBC 8.4 18.8* 19.4*  ?HGB 15.2* 14.0 13.9  ?HCT 44.3 39.9  38.9  ?PLT 251 290 377  ?MCV 93.5 91.9 91.5  ?MCH 32.1 32.3 32.7  ?MCHC 34.3 35.1 35.7  ?RDW 11.9 12.4 12.7  ?LYMPHSABS 1.5  --   --   ?MONOABS 1.0  --   --   ?EOSABS 0.0  --   --   ?BASOSABS 0.0  --   --   ? ? ?Electrolytes ?Recent Labs  ?Lab 04/06/22 ?2338 04/07/22 ?0459 04/07/22 ?0725 04/08/22 ?0421 04/08/22 ?0422 04/08/22 ?0600 04/09/22 ?6629  ?NA 132*  --   --  136  --   --  136  ?K 2.9*  --   --  4.1  --   --  3.9  ?CL 93*  --   --  107  --   --  104  ?CO2 24  --   --  22  --   --  23  ?GLUCOSE 191*  --   --  153*  --   --  179*  ?BUN <5*  --   --  7  --   --  9  ?CREATININE 0.89  --   --  0.66  --    --  0.86  ?CALCIUM 8.9  --   --  8.9  --   --  8.9  ?AST 32  --   --   --   --   --   --   ?ALT 20  --   --   --   --   --   --   ?ALKPHOS 58  --   --   --   --   --   --   ?BILITOT 0.6  --   --   --   --   --   --   ?ALBUMIN 3.5  --   --   --   --   --   --   ?MG  --   --  2.0 2.0  --   --  2.1  ?CRP  --   --   --   --   --  13.7* 4.3*  ?PROCALCITON  --  0.86  --   --   --  1.15 0.87  ?TSH  --   --  0.281*  --   --   --   --   ?HGBA1C  --   --   --   --  5.7*  --   --   ?BNP  --  48.9  --   --  138.0*  --  120.2*  ? ?Radiology Reports ?CT Angio Chest PE W/Cm &/Or Wo Cm ? ?Result Date: 04/07/2022 ?CLINICAL DATA:  Pulmonary embolism suspected. EXAM: CT ANGIOGRAPHY CHEST WITH CONTRAST TECHNIQUE: Multidetector CT imaging of the chest was performed using the standard protocol during bolus administration of intravenous contrast. Multiplanar CT image reconstructions and MIPs were obtained to evaluate the vascular anatomy. RADIATION DOSE REDUCTION: This exam was performed according to the departmental dose-optimization program which includes automated exposure control, adjustment of the mA and/or kV according to patient size and/or use of iterative reconstruction technique. CONTRAST:  143m OMNIPAQUE IOHEXOL 350 MG/ML SOLN COMPARISON:  07/03/2021 FINDINGS: Cardiovascular: Contrast injection is sufficient to demonstrate satisfactory opacification of the pulmonary arteries to the segmental level. There is no pulmonary embolus or evidence of right heart strain. The size of the main pulmonary artery is normal. Heart size is normal, with no pericardial effusion. The course and caliber of the aorta are normal. There is  no atherosclerotic calcification. Opacification decreased due to pulmonary arterial phase contrast bolus timing. Mediastinum/Nodes: No mediastinal, hilar or axillary lymphadenopathy. Normal visualized thyroid. Thoracic esophageal course is normal. Lungs/Pleura: Mild emphysematous change. Multifocal ground-glass  opacity in a parahilar distribution, likely pulmonary edema. No pleural effusion. Upper Abdomen: Contrast bolus timing is not optimized for evaluation of the abdominal organs. The visualized portions of

## 2022-04-10 NOTE — Progress Notes (Signed)
SATURATION QUALIFICATIONS: (This note is used to comply with regulatory documentation for home oxygen) ? ?Patient Saturations on Room Air at Rest = 92% ? ?Patient Saturations on Room Air while Ambulating = 86% ? ?Patient Saturations on 1 Liters of oxygen while Ambulating = 92% ? ? ?

## 2022-04-11 ENCOUNTER — Other Ambulatory Visit (HOSPITAL_COMMUNITY): Payer: Self-pay

## 2022-04-11 DIAGNOSIS — J9601 Acute respiratory failure with hypoxia: Secondary | ICD-10-CM | POA: Diagnosis not present

## 2022-04-11 MED ORDER — METHYLPREDNISOLONE 4 MG PO TBPK
ORAL_TABLET | ORAL | 0 refills | Status: AC
  Filled 2022-04-11: qty 21, 6d supply, fill #0

## 2022-04-11 MED ORDER — ALBUTEROL SULFATE HFA 108 (90 BASE) MCG/ACT IN AERS
2.0000 | INHALATION_SPRAY | Freq: Four times a day (QID) | RESPIRATORY_TRACT | 0 refills | Status: AC | PRN
  Filled 2022-04-11: qty 6.7, 25d supply, fill #0

## 2022-04-11 MED ORDER — FUROSEMIDE 40 MG PO TABS
40.0000 mg | ORAL_TABLET | Freq: Every day | ORAL | 0 refills | Status: AC | PRN
  Filled 2022-04-11: qty 10, 10d supply, fill #0

## 2022-04-11 MED ORDER — AZITHROMYCIN 250 MG PO TABS
ORAL_TABLET | ORAL | 0 refills | Status: AC
  Filled 2022-04-11: qty 2, 2d supply, fill #0

## 2022-04-11 MED ORDER — POTASSIUM CHLORIDE CRYS ER 10 MEQ PO TBCR
10.0000 meq | EXTENDED_RELEASE_TABLET | Freq: Every day | ORAL | 0 refills | Status: AC | PRN
  Filled 2022-04-11: qty 20, 20d supply, fill #0

## 2022-04-11 NOTE — TOC Transition Note (Signed)
Transition of Care (TOC) - CM/SW Discharge Note ? ? ?Patient Details  ?Name: [name] [name] ?MRN: [id number] ?Date of Birth: 04-17-1971 ? ?Transition of Care [facility]) CM/SW Contact:  ?Sharin Mons, RN ?Phone Number: ?04/11/2022, 9:51 AM ? ? ?Clinical Narrative:    ?Patient will DC to: home ?Anticipated DC date: 04/11/2022 ?Family notified:yes ?Transport by: car ? ?Pt presented with acute hypoxic respiratory failure due to asthma/bronchospasm. From home with family. PTA independent with ADL's. ?Per MD patient ready for DC today. RN, patient, and patient's family aware of DC.   ?TOC pharmacy to delivery Rx meds to bedside prior to d/c. Pt without Rx med concerns. ?No DME needs noted. ?Post hospital f/u noted on AVS. Pt  states will f/u with PCP on 5/2. ?Pt with transportation to home. ? ?RNCM will sign off for now as intervention is no longer needed. Please consult [place] again if new needs arise.  ? ? ?Final next level of care: Home/Self Care ?Barriers to Discharge: No Barriers Identified ? ? ?Patient Goals and CMS Choice ?  ?  ?  ? ?Discharge Placement ?  ?           ?  ?  ?  ?  ? ?Discharge Plan and Services ?  ?  ?           ?  ?  ?  ?  ?  ?  ?  ?  ?  ?  ? ?Social Determinants of Health (SDOH) Interventions ?  ? ? ?Readmission Risk Interventions ?   ? View : No data to display.  ?  ?  ?  ? ? ? ? ? ?

## 2022-04-11 NOTE — Progress Notes (Signed)
Pt ambulated about 3 minutes without O2. When we returned to the room her O2 on RA was 98%. She said she is ready for her walking papers.  ?

## 2022-04-11 NOTE — Plan of Care (Signed)
?  Problem: Activity: ?Goal: Ability to tolerate increased activity will improve ?04/11/2022 0027 by Teodoro Spray, RN ?Outcome: Progressing ?04/10/2022 1950 by Teodoro Spray, RN ?Outcome: Progressing ?  ?Problem: Respiratory: ?Goal: Levels of oxygenation will improve ?Outcome: Progressing ?  ?

## 2022-04-11 NOTE — Progress Notes (Signed)
Discharge instructions given. Patient verbalized understanding and all questions were answered.  ?

## 2022-04-11 NOTE — Discharge Summary (Signed)
?                                                                                ? ?[name] [name] YQM:[id number] DOB: 1971/10/10 DOA: 04/06/2022 ? ?PCP: [name], FNP ? ?Admit date: 04/06/2022  Discharge date: 04/11/2022 ? ?Admitted From: Home   Disposition:  Home ? ? ?Recommendations for Outpatient Follow-up:  ? ?Follow up with PCP in 1-2 weeks ? ?PCP Please obtain BMP/CBC, 2 view CXR in 1week,  (see Discharge instructions)  ? ?PCP Please follow up on the following pending results: Please monitor fluid status closely, kindly try and reduce her home narcotics and sedative medication dosages.  She was stable here on half the home dose.  Monitor for symptoms of diastolic CHF, outpatient PFT.  Please check CBC, BMP, magnesium, TSH, free T4 and T3 in 7 to 10 days along with a two-view chest x-ray. ? ? ?Home Health: None   ?Equipment/Devices: None  ?Consultations: None  ?Discharge Condition: Stable    ?CODE STATUS: Full    ?Diet Recommendation: Heart Healthy with 1.5 L fluid restriction per day. ? ? ?Chief Complaint  ?Patient presents with  ? Respiratory Distress  ?  ? ?Brief history of present illness from the day of admission and additional interim summary   ? ? 51 y.o. female with medical history significant for chronic back pain, history of PE no longer anticoagulated, and tobacco abuse, now presenting to the emergency department for evaluation of dry cough, wheezing and shortness of breath x 5 days, was found to be hypoxic around 87% on room air with increased work of breathing, was diagnosed with acute hypoxic respiratory failure due to asthma and admitted to the hospital. ? ?                                                               Hospital Course  ? ? Acute hypoxic respiratory failure due to asthma/bronchospasm.  Significant wheezing on exam, no fever or leukocytosis (time of admission now has steroid  exposure).  She did have a dry cough and some wheezing along with crackles, BNP was elevated she certainly had some bronchitis but there was also a touch of acute on chronic diastolic CHF.  She was treated with steroids, antibiotics along with IV Lasix.  She has gradually but definitely improved and now back to baseline on room air, ambulated in the hallway this morning pulse ox remained above 95% on room air.  Will be discharged home on Medrol Dosepak, as needed Lasix and potassium, albuterol inhaler with close outpatient follow-up with PCP.  PCP to monitor for symptoms of CHF, outpatient PFT recommended.  May benefit from outpatient cardiology and pulmonary follow-up will defer this to PCP ?  ?2.  Hypokalemia.  Replaced. ?  ?3.  Chronic pain.  Stable on half of her home dose medications.  Will request PCP to try and titrate her chronic narcotics and sedatives down. ?  ?4.  Active ongoing smoker.  Counseled to quit. ?  ?5.  Remote history of PE in 2009.  Negative CTA.  No longer on therapeutic anticoagulation. ?  ?6.  Acute on chronic diastolic CHF on echocardiogram with EF 60%.  Continue diuresis with Lasix and monitor.  Outpatient cardiology follow-up.  Now on as needed Lasix PCP to monitor.  Blood pressure has stabilized initially was low if it remained stable PCP can consider adding low-dose Coreg to her regimen. ?  ?7.  Chronic hypotension.  Improved after her narcotic doses were reduced, PCP to monitor. ?  ?8.  Mildly low TSH with borderline elevated free T4.  Could be sick euthyroid, request PCP to repeat TSH, free T4 and T3 in 2 to 3 weeks post discharge.  Patient also advised. ? ? ?Discharge diagnosis   ? ? ?Principal Problem: ?  Acute respiratory failure with hypoxia ([place]) ?Active Problems: ?  Hypokalemia ?  Chronic pain ?  Bronchospasm, acute ?  Current smoker ? ? ? ?Discharge instructions   ? ?Discharge Instructions   ? ? Discharge instructions   Complete by: As directed ?  ? Follow with Primary MD  [name], FNP in 7 days  ? ?Get CBC, CMP, TSH, free T4, T3, hCG, 2 view Chest X ray -  checked next visit within 1 week by Primary MD   ? ?Activity: As tolerated with Full fall precautions use walker/cane & assistance as needed ? ?Disposition Home   ? ?Diet: Heart Healthy with 1.5 L fluid restriction per day. ? ?Special Instructions: If you have smoked or chewed Tobacco  in the last 2 yrs please stop smoking, stop any regular Alcohol  and or any Recreational drug use. ? ?On your next visit with your primary care physician please Get Medicines reviewed and adjusted. ? ?Please request your Prim.MD to go over all Hospital Tests and Procedure/Radiological results at the follow up, please get all Hospital records sent to your Prim MD by signing hospital release before you go home. ? ?If you experience worsening of your admission symptoms, develop shortness of breath, life threatening emergency, suicidal or homicidal thoughts you must seek medical attention immediately by calling 911 or calling your MD immediately  if symptoms less severe. ? ?You Must read complete instructions/literature along with all the possible adverse reactions/side effects for all the Medicines you take and that have been prescribed to you. Take any new Medicines after you have completely understood and accpet all the possible adverse reactions/side effects.  ? Increase activity slowly   Complete by: As directed ?  ? ?  ? ? ?Discharge Medications  ? ?Allergies as of 04/11/2022   ? ?   Reactions  ? Hydrocodone-acetaminophen Nausea Only  ? Pork-derived Products Nausea Only  ? ?  ? ?  ?Medication List  ?  ? ?STOP taking these medications   ? ?diclofenac 50 MG EC tablet ?Commonly known as: VOLTAREN ?  ?ondansetron 4 MG disintegrating tablet ?Commonly known as: Zofran ODT ?  ?oxyCODONE 10 mg 12 hr tablet ?Commonly known as: OXYCONTIN ?  ?pantoprazole 20 MG tablet ?Commonly known as: PROTONIX ?  ? ?  ? ?TAKE these medications   ? ?albuterol 108 (90  Base) MCG/ACT inhaler ?Commonly known as: VENTOLIN HFA ?Inhale 2 puffs into the lungs every 6 (six) hours as needed for wheezing or shortness of breath. ?  ?azithromycin 250 MG tablet ?Commonly known as: Zithromax Z-Pak ?Take as directed ?  ?bisacodyl 5 MG EC tablet ?  Commonly known as: DULCOLAX ?Take 5-10 mg by mouth at bedtime. ?  ?furosemide 40 MG tablet ?Commonly known as: Lasix ?Take 1 tablet (40 mg total) by mouth daily as needed for edema or fluid. ?  ?HYDROmorphone 4 MG tablet ?Commonly known as: DILAUDID ?Take 4 mg by mouth every 6 (six) hours as needed for moderate pain or severe pain. ?  ?meloxicam 15 MG tablet ?Commonly known as: MOBIC ?Take 1 tablet by mouth daily. ?  ?methocarbamol 500 MG tablet ?Commonly known as: ROBAXIN ?Take 1 tablet (500 mg total) by mouth every 8 (eight) hours as needed for muscle spasms. ?  ?methylPREDNISolone 4 MG Tbpk tablet ?Commonly known as: MEDROL DOSEPAK ?follow package directions ?  ?naloxone 4 MG/0.1ML Liqd nasal spray kit ?Commonly known as: NARCAN ?Use as directed for opioid respiratory depression or overdose. ?What changed:  ?how much to take ?how to take this ?when to take this ?  ?Potassium Chloride ER 20 MEQ Tbcr ?Take 10 mEq by mouth daily as needed (the day you take Lasix for fluid buildup). ?  ?pregabalin 75 MG capsule ?Commonly known as: LYRICA ?Take 1 capsule (75 mg total) by mouth 2 (two) times daily. ?  ?tiZANidine 4 MG tablet ?Commonly known as: ZANAFLEX ?Take 4 mg by mouth in the morning, at noon, and at bedtime. ?  ? ?  ? ? ? Follow-up Information   ? ? [name], FNP. Schedule an appointment as soon as possible for a visit in 1 week(s).   ?Specialty: Family Medicine ?Contact information: ?[address] ?[place] [address] ?[phone] ? ? ?  ?  ? ?  ?  ? ?  ? ? ?Major procedures and Radiology Reports - PLEASE review detailed and final reports thoroughly  -    ? ?  ?CT Angio Chest PE W/Cm &/Or Wo Cm ? ?Result Date:  04/07/2022 ?CLINICAL DATA:  Pulmonary embolism suspected. EXAM: CT ANGIOGRAPHY CHEST WITH CONTRAST TECHNIQUE: Multidetector CT imaging of the chest was performed using the standard protocol during bolus administration of intr

## 2022-04-11 NOTE — Progress Notes (Signed)
Mobility Specialist Progress Note  ? ? 04/11/22 1004  ?Mobility  ?Activity Ambulated independently in hallway  ?Level of Assistance Modified independent, requires aide device or extra time  ?Assistive Device Four wheel walker  ?Distance Ambulated (ft) 570 ft  ?Activity Response Tolerated well  ?$Mobility charge 1 Mobility  ? ?Post-Mobility: 94% SpO2 ? ?Pt received in bed and agreeable. No complaints on walk. Returned to bed with call bell in reach.   ? ?[name] [name] ?Mobility Specialist  ?Primary: [address] Phone: [phone] ?Secondary: [address] Phone: [phone] ?  ?

## 2023-12-23 IMAGING — CT CT ANGIO CHEST
2 of 6 series · 18 of 36 positions shown · IV contrast (agent unspecified)
Comparison: 07/03/2021

CLINICAL DATA: Pulmonary embolism suspected.

EXAM:
CT ANGIOGRAPHY CHEST WITH CONTRAST
TECHNIQUE: Multidetector CT imaging of the chest was performed using the
standard protocol during bolus administration of intravenous
contrast. Multiplanar CT image reconstructions and MIPs were
obtained to evaluate the vascular anatomy.

[Series 7: pe thins · axial · 0.80mm/px · z∈[+1212,+1497]mm · 17 of 454 slices shown]
[im 23/454  lung]
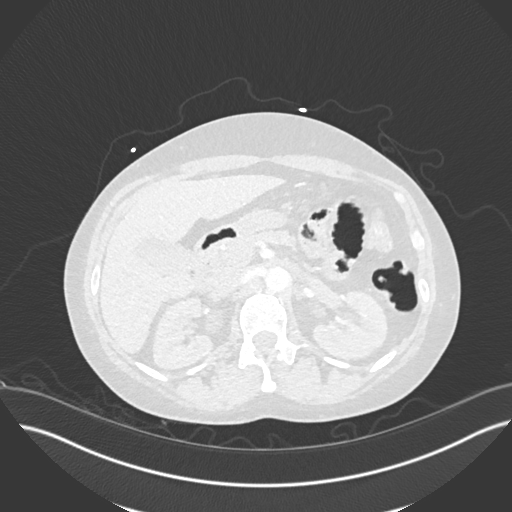
[im 46/454  mediastinal]
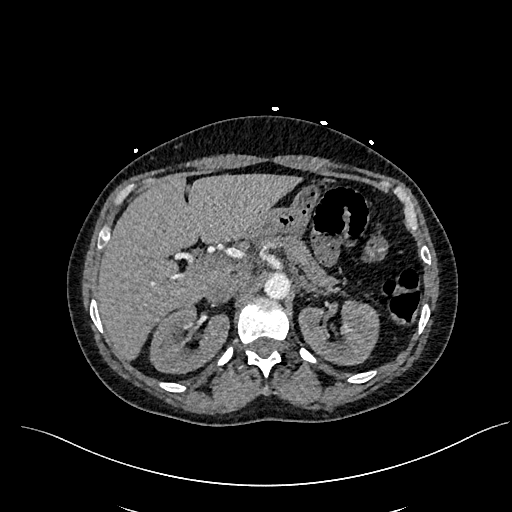
[im 68/454  lung]
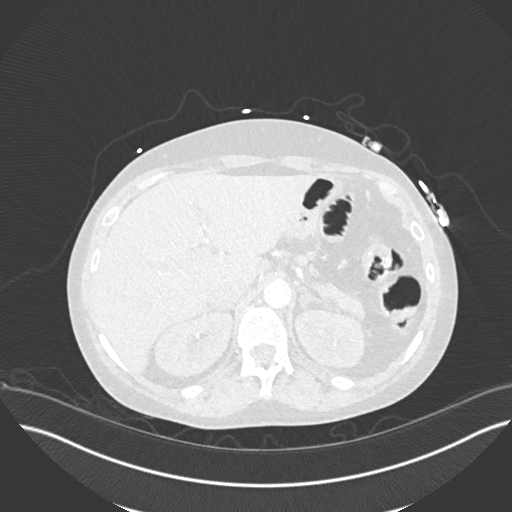
[im 91/454  mediastinal]
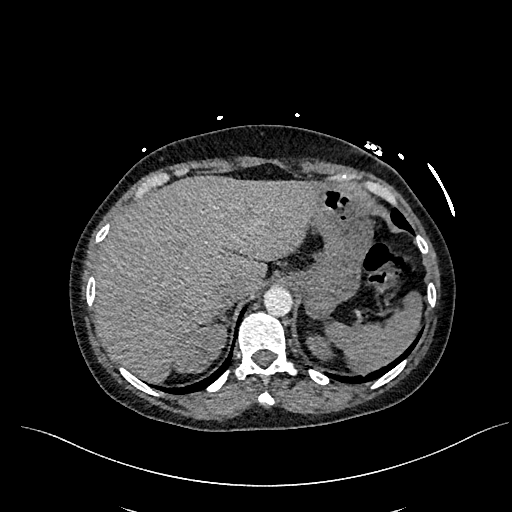
[im 136/454  lung]
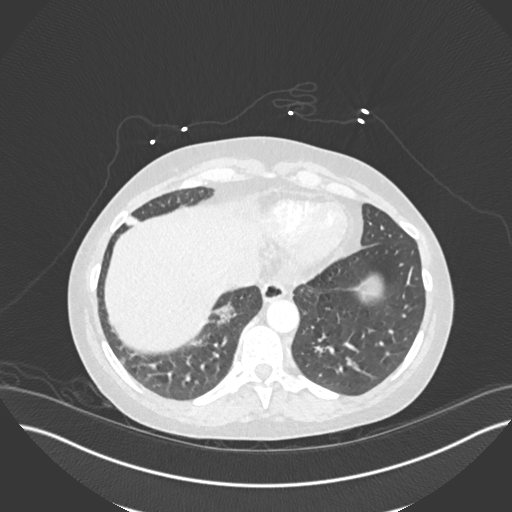
[im 159/454  mediastinal]
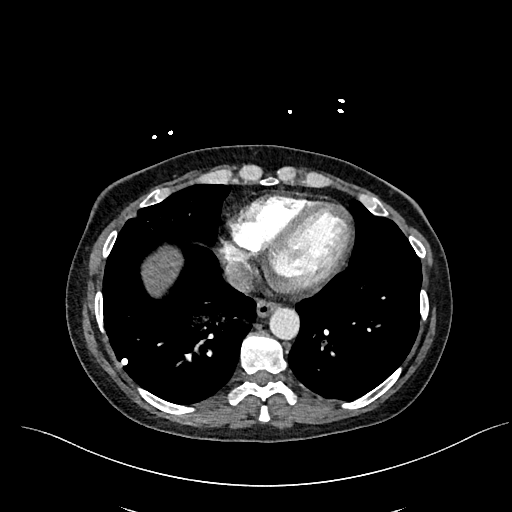
[im 182/454  lung]
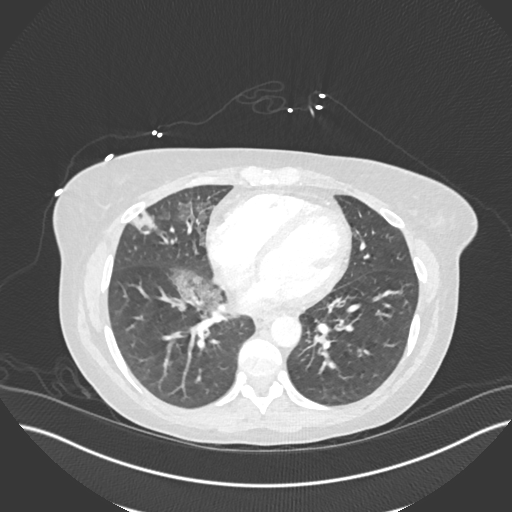
[im 204/454  mediastinal]
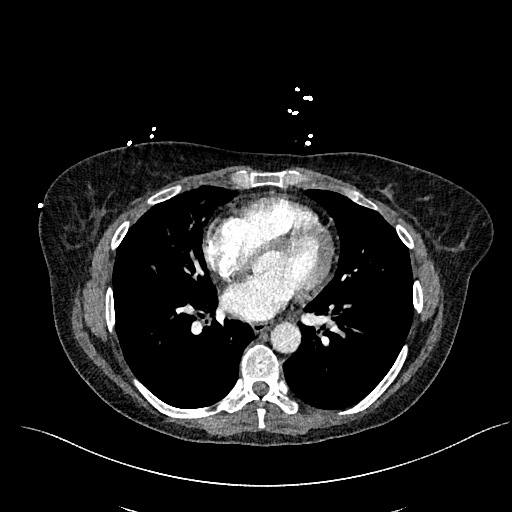
[im 227/454  lung]
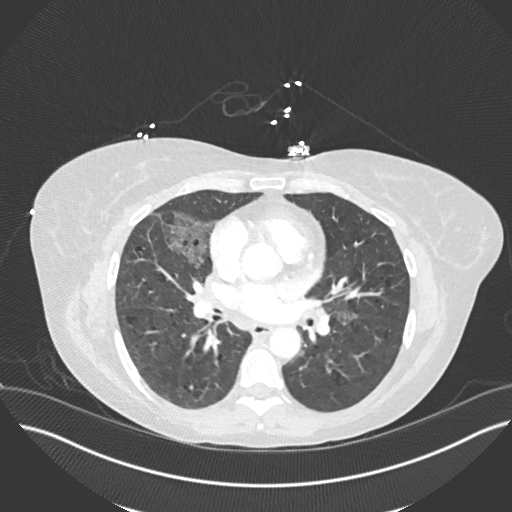
[im 250/454  mediastinal]
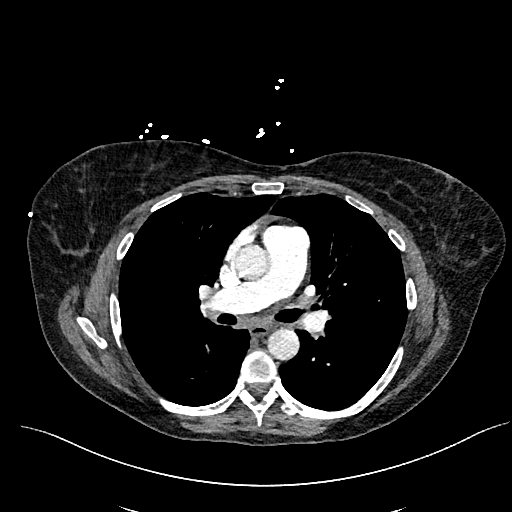
[im 272/454  lung]
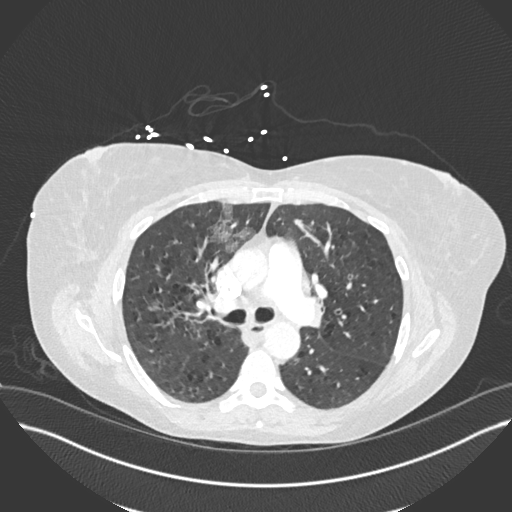
[im 295/454  mediastinal]
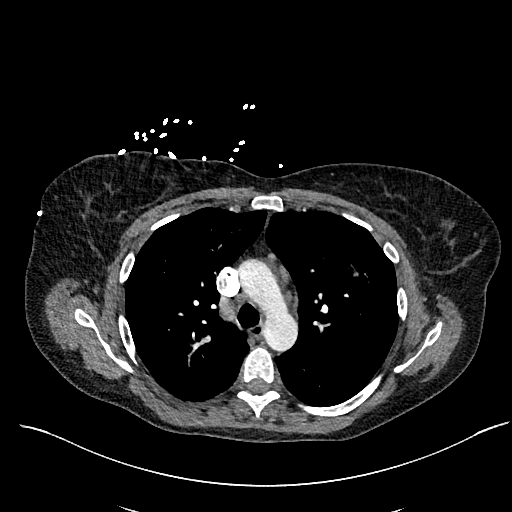
[im 318/454  lung]
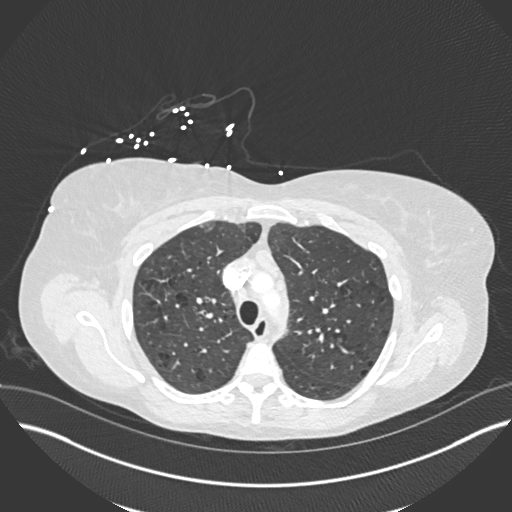
[im 363/454  mediastinal]
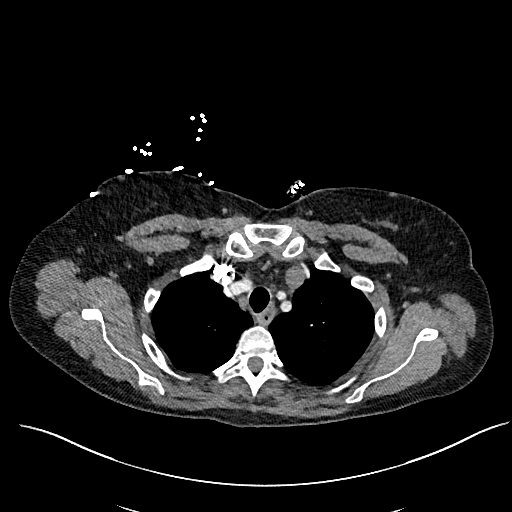
[im 386/454  lung]
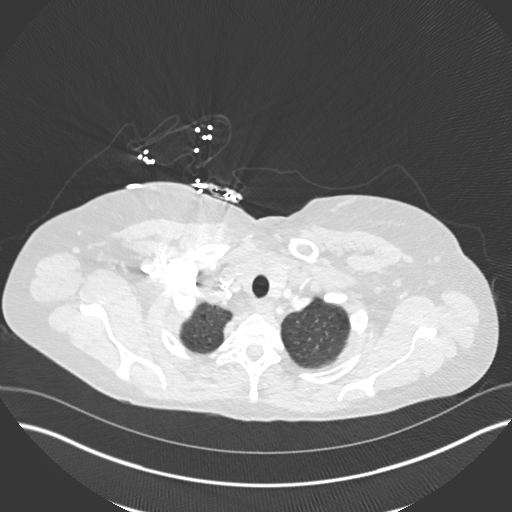
[im 408/454  mediastinal]
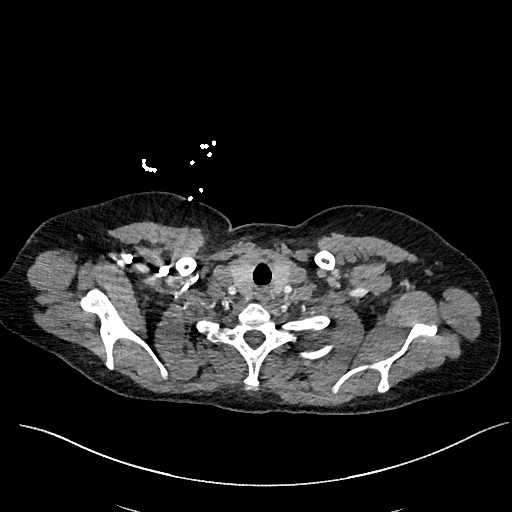
[im 431/454  lung]
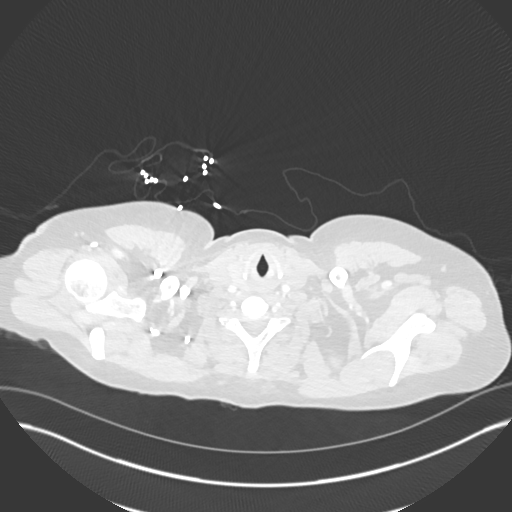

[Series 8: pe 2mm cor · coronal · 0.62mm/px · 1 of 151 slices shown]
[im 76/151  mediastinal]
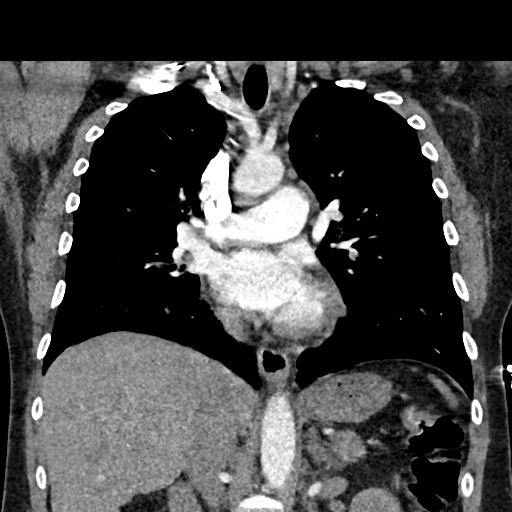

[18 of 36 positions shown; findings below may reference images not displayed]

RADIATION DOSE REDUCTION: This exam was performed according to the
departmental dose-optimization program which includes automated
exposure control, adjustment of the mA and/or kV according to
patient size and/or use of iterative reconstruction technique.

CONTRAST:  100mL OMNIPAQUE IOHEXOL 350 MG/ML SOLN
FINDINGS: Cardiovascular: Contrast injection is sufficient to demonstrate
satisfactory opacification of the pulmonary arteries to the
segmental level. There is no pulmonary embolus or evidence of right
heart strain. The size of the main pulmonary artery is normal. Heart
size is normal, with no pericardial effusion. The course and caliber
of the aorta are normal. There is no atherosclerotic calcification.
Opacification decreased due to pulmonary arterial phase contrast
bolus timing.

Mediastinum/Nodes: No mediastinal, hilar or axillary
lymphadenopathy. Normal visualized thyroid. Thoracic esophageal
course is normal.

Lungs/Pleura: Mild emphysematous change. Multifocal ground-glass
opacity in a parahilar distribution, likely pulmonary edema. No
pleural effusion.

Upper Abdomen: Contrast bolus timing is not optimized for evaluation
of the abdominal organs. The visualized portions of the organs of
the upper abdomen are normal.

Musculoskeletal: No chest wall abnormality. No bony spinal canal
stenosis.

Review of the MIP images confirms the above findings.
IMPRESSION: 1. No pulmonary embolus or acute aortic syndrome.
2. Multifocal ground-glass opacity in a parahilar distribution,
likely pulmonary edema.

Emphysema ([id number]).

## 2023-12-24 IMAGING — DX DG CHEST 1V PORT
1 series · 1 of 1 positions shown · non-contrast
Comparison: 04/06/2022

CLINICAL DATA: Shortness of breath and chest pain.

EXAM:
PORTABLE CHEST 1 VIEW

[chest]
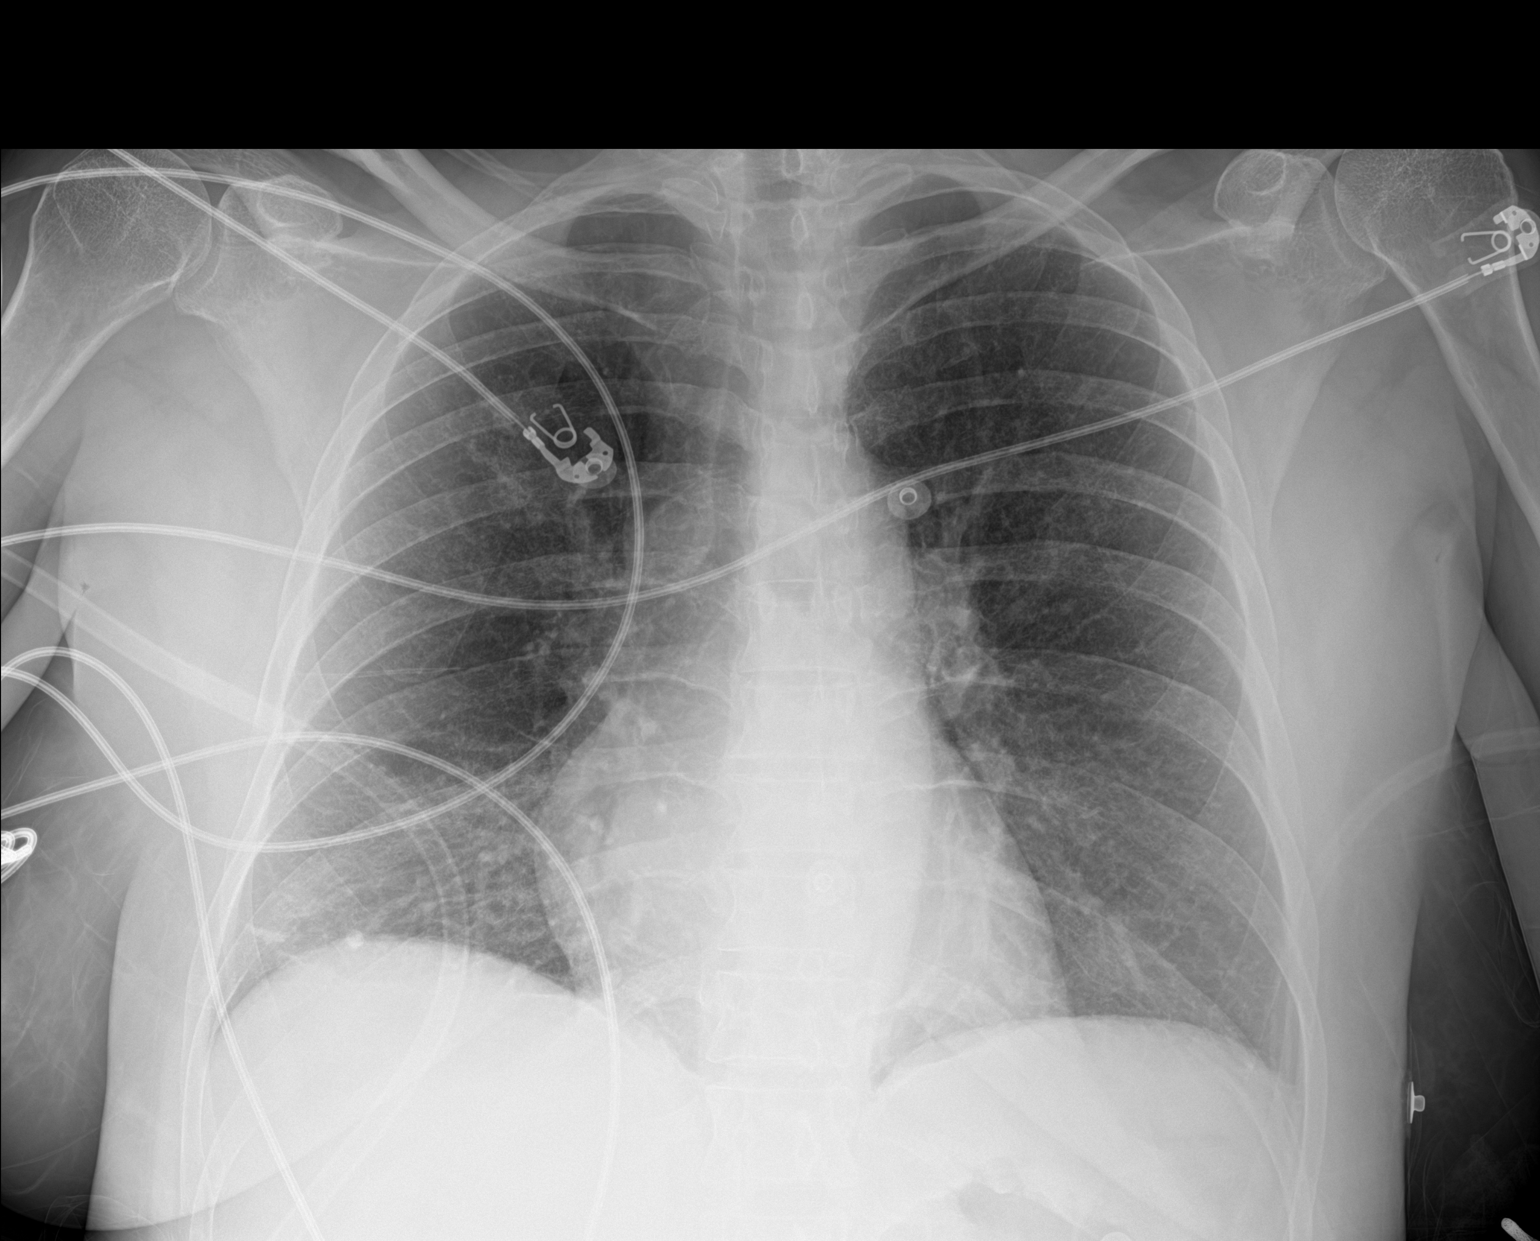

[1 of 1 positions shown; findings below may reference images not displayed]

FINDINGS: 4021 hours. The lungs are clear without focal pneumonia, edema,
pneumothorax or pleural effusion. Atelectasis noted at the bases,
right greater than left. The cardiopericardial silhouette is within
normal limits for size. The visualized bony structures of the thorax
are unremarkable. Telemetry leads overlie the chest.
IMPRESSION: No active disease.

## 2024-05-21 ENCOUNTER — Other Ambulatory Visit (HOSPITAL_COMMUNITY): Payer: Self-pay
